# Patient Record
Sex: Female | Born: 1935 | Race: White | Hispanic: No | Marital: Single | Smoking: Former smoker
Health system: Southern US, Community
[De-identification: ages and names within clinical notes are randomized; demographics above are authoritative.]

## PROBLEM LIST (undated history)

## (undated) DIAGNOSIS — I2510 Atherosclerotic heart disease of native coronary artery without angina pectoris: Secondary | ICD-10-CM

## (undated) DIAGNOSIS — I10 Essential (primary) hypertension: Secondary | ICD-10-CM

## (undated) DIAGNOSIS — E039 Hypothyroidism, unspecified: Secondary | ICD-10-CM

## (undated) DIAGNOSIS — C801 Malignant (primary) neoplasm, unspecified: Secondary | ICD-10-CM

## (undated) DIAGNOSIS — I509 Heart failure, unspecified: Secondary | ICD-10-CM

## (undated) DIAGNOSIS — Z950 Presence of cardiac pacemaker: Secondary | ICD-10-CM

## (undated) DIAGNOSIS — I441 Atrioventricular block, second degree: Secondary | ICD-10-CM

## (undated) DIAGNOSIS — F411 Generalized anxiety disorder: Secondary | ICD-10-CM

## (undated) DIAGNOSIS — E785 Hyperlipidemia, unspecified: Secondary | ICD-10-CM

## (undated) DIAGNOSIS — N189 Chronic kidney disease, unspecified: Secondary | ICD-10-CM

## (undated) DIAGNOSIS — E119 Type 2 diabetes mellitus without complications: Secondary | ICD-10-CM

## (undated) DIAGNOSIS — Z923 Personal history of irradiation: Secondary | ICD-10-CM

## (undated) DIAGNOSIS — I219 Acute myocardial infarction, unspecified: Secondary | ICD-10-CM

## (undated) DIAGNOSIS — M8580 Other specified disorders of bone density and structure, unspecified site: Secondary | ICD-10-CM

## (undated) DIAGNOSIS — C4490 Unspecified malignant neoplasm of skin, unspecified: Secondary | ICD-10-CM

## (undated) DIAGNOSIS — F39 Unspecified mood [affective] disorder: Secondary | ICD-10-CM

## (undated) DIAGNOSIS — Z794 Long term (current) use of insulin: Secondary | ICD-10-CM

## (undated) DIAGNOSIS — IMO0001 Reserved for inherently not codable concepts without codable children: Secondary | ICD-10-CM

## (undated) DIAGNOSIS — I639 Cerebral infarction, unspecified: Secondary | ICD-10-CM

## (undated) DIAGNOSIS — R55 Syncope and collapse: Secondary | ICD-10-CM

## (undated) DIAGNOSIS — C44399 Other specified malignant neoplasm of skin of other parts of face: Secondary | ICD-10-CM

## (undated) DIAGNOSIS — R0989 Other specified symptoms and signs involving the circulatory and respiratory systems: Secondary | ICD-10-CM

## (undated) DIAGNOSIS — M1710 Unilateral primary osteoarthritis, unspecified knee: Secondary | ICD-10-CM

## (undated) DIAGNOSIS — J449 Chronic obstructive pulmonary disease, unspecified: Secondary | ICD-10-CM

## (undated) HISTORY — DX: Atrioventricular block, second degree: I44.1

## (undated) HISTORY — DX: Other specified symptoms and signs involving the circulatory and respiratory systems: R09.89

## (undated) HISTORY — DX: Personal history of irradiation: Z92.3

## (undated) HISTORY — PX: TUMOR REMOVAL: SHX12

---

## 1998-06-15 ENCOUNTER — Ambulatory Visit: Admission: RE | Admit: 1998-06-15 | Discharge: 1998-06-15 | Payer: Self-pay | Admitting: Internal Medicine

## 1999-11-09 ENCOUNTER — Encounter: Admission: RE | Admit: 1999-11-09 | Discharge: 2000-02-07 | Payer: Self-pay | Admitting: Orthopedic Surgery

## 2000-02-15 ENCOUNTER — Encounter: Admission: RE | Admit: 2000-02-15 | Discharge: 2000-04-24 | Payer: Self-pay | Admitting: Orthopedic Surgery

## 2000-03-01 ENCOUNTER — Other Ambulatory Visit: Admission: RE | Admit: 2000-03-01 | Discharge: 2000-03-01 | Payer: Self-pay | Admitting: Urology

## 2000-08-01 ENCOUNTER — Encounter: Admission: RE | Admit: 2000-08-01 | Discharge: 2000-08-03 | Payer: Self-pay | Admitting: Orthopedic Surgery

## 2000-12-05 ENCOUNTER — Encounter: Admission: RE | Admit: 2000-12-05 | Discharge: 2000-12-11 | Payer: Self-pay | Admitting: Orthopedic Surgery

## 2001-01-25 ENCOUNTER — Encounter: Payer: Self-pay | Admitting: Internal Medicine

## 2001-01-25 ENCOUNTER — Ambulatory Visit (HOSPITAL_COMMUNITY): Admission: RE | Admit: 2001-01-25 | Discharge: 2001-01-25 | Payer: Self-pay | Admitting: Internal Medicine

## 2001-02-28 ENCOUNTER — Encounter: Payer: Self-pay | Admitting: *Deleted

## 2001-02-28 ENCOUNTER — Inpatient Hospital Stay (HOSPITAL_COMMUNITY): Admission: EM | Admit: 2001-02-28 | Discharge: 2001-03-05 | Payer: Self-pay | Admitting: *Deleted

## 2001-03-01 ENCOUNTER — Encounter: Payer: Self-pay | Admitting: Cardiology

## 2001-03-02 HISTORY — PX: CORONARY ANGIOPLASTY WITH STENT PLACEMENT: SHX49

## 2001-03-13 ENCOUNTER — Encounter: Admission: RE | Admit: 2001-03-13 | Discharge: 2001-03-19 | Payer: Self-pay | Admitting: Orthopedic Surgery

## 2001-07-01 ENCOUNTER — Encounter: Admission: RE | Admit: 2001-07-01 | Discharge: 2001-07-06 | Payer: Self-pay | Admitting: Orthopedic Surgery

## 2001-07-10 ENCOUNTER — Encounter (HOSPITAL_COMMUNITY): Admission: RE | Admit: 2001-07-10 | Discharge: 2001-10-08 | Payer: Self-pay | Admitting: Cardiology

## 2001-08-07 ENCOUNTER — Encounter: Payer: Self-pay | Admitting: Emergency Medicine

## 2001-08-07 ENCOUNTER — Emergency Department (HOSPITAL_COMMUNITY): Admission: EM | Admit: 2001-08-07 | Discharge: 2001-08-07 | Payer: Self-pay | Admitting: Emergency Medicine

## 2001-09-03 ENCOUNTER — Encounter (HOSPITAL_BASED_OUTPATIENT_CLINIC_OR_DEPARTMENT_OTHER): Admission: RE | Admit: 2001-09-03 | Discharge: 2001-11-23 | Payer: Self-pay | Admitting: Internal Medicine

## 2001-11-27 ENCOUNTER — Inpatient Hospital Stay (HOSPITAL_COMMUNITY): Admission: AD | Admit: 2001-11-27 | Discharge: 2001-12-02 | Payer: Self-pay | Admitting: Cardiovascular Disease

## 2001-11-28 HISTORY — PX: CARDIAC CATHETERIZATION: SHX172

## 2001-11-29 HISTORY — PX: CORONARY ANGIOPLASTY WITH STENT PLACEMENT: SHX49

## 2002-01-07 ENCOUNTER — Encounter (HOSPITAL_BASED_OUTPATIENT_CLINIC_OR_DEPARTMENT_OTHER): Admission: RE | Admit: 2002-01-07 | Discharge: 2002-04-07 | Payer: Self-pay | Admitting: Internal Medicine

## 2002-04-16 ENCOUNTER — Encounter (HOSPITAL_BASED_OUTPATIENT_CLINIC_OR_DEPARTMENT_OTHER): Admission: RE | Admit: 2002-04-16 | Discharge: 2002-07-15 | Payer: Self-pay | Admitting: Internal Medicine

## 2002-07-16 ENCOUNTER — Encounter (HOSPITAL_BASED_OUTPATIENT_CLINIC_OR_DEPARTMENT_OTHER): Admission: RE | Admit: 2002-07-16 | Discharge: 2002-10-14 | Payer: Self-pay | Admitting: Internal Medicine

## 2002-10-15 ENCOUNTER — Inpatient Hospital Stay (HOSPITAL_COMMUNITY): Admission: EM | Admit: 2002-10-15 | Discharge: 2002-10-18 | Payer: Self-pay | Admitting: Emergency Medicine

## 2002-10-15 ENCOUNTER — Encounter: Payer: Self-pay | Admitting: Cardiology

## 2002-10-16 ENCOUNTER — Encounter: Payer: Self-pay | Admitting: Emergency Medicine

## 2002-10-16 HISTORY — PX: CORONARY ANGIOPLASTY WITH STENT PLACEMENT: SHX49

## 2002-10-21 ENCOUNTER — Encounter (HOSPITAL_BASED_OUTPATIENT_CLINIC_OR_DEPARTMENT_OTHER): Admission: RE | Admit: 2002-10-21 | Discharge: 2002-10-29 | Payer: Self-pay | Admitting: Internal Medicine

## 2002-10-25 DIAGNOSIS — R0989 Other specified symptoms and signs involving the circulatory and respiratory systems: Secondary | ICD-10-CM

## 2002-10-25 HISTORY — DX: Other specified symptoms and signs involving the circulatory and respiratory systems: R09.89

## 2003-01-24 ENCOUNTER — Encounter (HOSPITAL_BASED_OUTPATIENT_CLINIC_OR_DEPARTMENT_OTHER): Admission: RE | Admit: 2003-01-24 | Discharge: 2003-02-04 | Payer: Self-pay | Admitting: Internal Medicine

## 2003-02-07 ENCOUNTER — Emergency Department (HOSPITAL_COMMUNITY): Admission: EM | Admit: 2003-02-07 | Discharge: 2003-02-07 | Payer: Self-pay

## 2003-05-01 ENCOUNTER — Encounter (HOSPITAL_BASED_OUTPATIENT_CLINIC_OR_DEPARTMENT_OTHER): Admission: RE | Admit: 2003-05-01 | Discharge: 2003-05-16 | Payer: Self-pay | Admitting: Internal Medicine

## 2003-08-06 ENCOUNTER — Encounter (HOSPITAL_BASED_OUTPATIENT_CLINIC_OR_DEPARTMENT_OTHER): Admission: RE | Admit: 2003-08-06 | Discharge: 2003-08-22 | Payer: Self-pay | Admitting: Internal Medicine

## 2003-11-19 ENCOUNTER — Encounter (HOSPITAL_BASED_OUTPATIENT_CLINIC_OR_DEPARTMENT_OTHER): Admission: RE | Admit: 2003-11-19 | Discharge: 2003-12-05 | Payer: Self-pay | Admitting: Internal Medicine

## 2004-03-02 ENCOUNTER — Encounter (HOSPITAL_BASED_OUTPATIENT_CLINIC_OR_DEPARTMENT_OTHER): Admission: RE | Admit: 2004-03-02 | Discharge: 2004-03-23 | Payer: Self-pay | Admitting: Internal Medicine

## 2004-06-09 ENCOUNTER — Encounter (HOSPITAL_BASED_OUTPATIENT_CLINIC_OR_DEPARTMENT_OTHER): Admission: RE | Admit: 2004-06-09 | Discharge: 2004-06-18 | Payer: Self-pay | Admitting: Internal Medicine

## 2005-06-03 ENCOUNTER — Inpatient Hospital Stay (HOSPITAL_COMMUNITY): Admission: EM | Admit: 2005-06-03 | Discharge: 2005-06-05 | Payer: Self-pay | Admitting: Emergency Medicine

## 2005-12-19 ENCOUNTER — Encounter: Admission: RE | Admit: 2005-12-19 | Discharge: 2005-12-19 | Payer: Self-pay | Admitting: Internal Medicine

## 2006-01-12 ENCOUNTER — Emergency Department (HOSPITAL_COMMUNITY): Admission: EM | Admit: 2006-01-12 | Discharge: 2006-01-12 | Payer: Self-pay | Admitting: Emergency Medicine

## 2006-04-21 ENCOUNTER — Inpatient Hospital Stay (HOSPITAL_COMMUNITY): Admission: EM | Admit: 2006-04-21 | Discharge: 2006-04-27 | Payer: Self-pay | Admitting: Emergency Medicine

## 2006-06-19 ENCOUNTER — Encounter: Admission: RE | Admit: 2006-06-19 | Discharge: 2006-06-19 | Payer: Self-pay | Admitting: Neurosurgery

## 2006-09-15 ENCOUNTER — Emergency Department (HOSPITAL_COMMUNITY): Admission: EM | Admit: 2006-09-15 | Discharge: 2006-09-15 | Payer: Self-pay | Admitting: Emergency Medicine

## 2007-02-06 ENCOUNTER — Encounter: Admission: RE | Admit: 2007-02-06 | Discharge: 2007-02-06 | Payer: Self-pay | Admitting: General Surgery

## 2008-01-09 ENCOUNTER — Encounter: Admission: RE | Admit: 2008-01-09 | Discharge: 2008-01-09 | Payer: Self-pay | Admitting: Otolaryngology

## 2008-02-01 ENCOUNTER — Encounter: Admission: RE | Admit: 2008-02-01 | Discharge: 2008-02-01 | Payer: Self-pay | Admitting: Otolaryngology

## 2008-02-05 ENCOUNTER — Ambulatory Visit (HOSPITAL_BASED_OUTPATIENT_CLINIC_OR_DEPARTMENT_OTHER): Admission: RE | Admit: 2008-02-05 | Discharge: 2008-02-06 | Payer: Self-pay | Admitting: Otolaryngology

## 2008-02-05 ENCOUNTER — Encounter (INDEPENDENT_AMBULATORY_CARE_PROVIDER_SITE_OTHER): Payer: Self-pay | Admitting: Otolaryngology

## 2009-06-02 ENCOUNTER — Inpatient Hospital Stay (HOSPITAL_COMMUNITY): Admission: EM | Admit: 2009-06-02 | Discharge: 2009-06-05 | Payer: Self-pay | Admitting: Emergency Medicine

## 2009-06-03 ENCOUNTER — Encounter (INDEPENDENT_AMBULATORY_CARE_PROVIDER_SITE_OTHER): Payer: Self-pay | Admitting: Internal Medicine

## 2009-06-03 HISTORY — PX: CARDIAC CATHETERIZATION: SHX172

## 2009-06-04 HISTORY — PX: CORONARY ANGIOPLASTY WITH STENT PLACEMENT: SHX49

## 2009-10-08 ENCOUNTER — Encounter: Admission: RE | Admit: 2009-10-08 | Discharge: 2009-10-08 | Payer: Self-pay | Admitting: Internal Medicine

## 2010-04-25 ENCOUNTER — Encounter: Payer: Self-pay | Admitting: Internal Medicine

## 2010-05-27 ENCOUNTER — Inpatient Hospital Stay: Admission: AD | Admit: 2010-05-27 | Payer: Self-pay | Source: Ambulatory Visit | Admitting: Cardiovascular Disease

## 2010-05-27 ENCOUNTER — Inpatient Hospital Stay (HOSPITAL_COMMUNITY)
Admission: EM | Admit: 2010-05-27 | Discharge: 2010-05-29 | DRG: 287 | Disposition: A | Discharge status: Home / Self Care | Payer: Medicare Other | Attending: Cardiovascular Disease | Admitting: Cardiovascular Disease

## 2010-05-27 DIAGNOSIS — E785 Hyperlipidemia, unspecified: Secondary | ICD-10-CM | POA: Diagnosis present

## 2010-05-27 DIAGNOSIS — I2510 Atherosclerotic heart disease of native coronary artery without angina pectoris: Secondary | ICD-10-CM | POA: Diagnosis present

## 2010-05-27 DIAGNOSIS — Z9861 Coronary angioplasty status: Secondary | ICD-10-CM

## 2010-05-27 DIAGNOSIS — I10 Essential (primary) hypertension: Secondary | ICD-10-CM | POA: Diagnosis present

## 2010-05-27 DIAGNOSIS — E119 Type 2 diabetes mellitus without complications: Secondary | ICD-10-CM | POA: Diagnosis present

## 2010-05-27 DIAGNOSIS — R079 Chest pain, unspecified: Principal | ICD-10-CM | POA: Diagnosis present

## 2010-05-27 DIAGNOSIS — E669 Obesity, unspecified: Secondary | ICD-10-CM | POA: Diagnosis present

## 2010-05-27 LAB — DIFFERENTIAL
Basophils Relative: 1 % (ref 0–1)
Eosinophils Absolute: 0.3 10*3/uL (ref 0.0–0.7)
Eosinophils Relative: 3 % (ref 0–5)
Monocytes Absolute: 1.3 10*3/uL — ABNORMAL HIGH (ref 0.1–1.0)
Monocytes Relative: 13 % — ABNORMAL HIGH (ref 3–12)

## 2010-05-27 LAB — CBC
Hemoglobin: 14.5 g/dL (ref 12.0–15.0)
MCH: 29.5 pg (ref 26.0–34.0)
MCHC: 33.4 g/dL (ref 30.0–36.0)
Platelets: 236 10*3/uL (ref 150–400)
RDW: 15.1 % (ref 11.5–15.5)

## 2010-05-27 LAB — CK TOTAL AND CKMB (NOT AT ARMC): Relative Index: INVALID (ref 0.0–2.5)

## 2010-05-27 LAB — COMPREHENSIVE METABOLIC PANEL
ALT: 14 U/L (ref 0–35)
BUN: 17 mg/dL (ref 6–23)
CO2: 24 mEq/L (ref 19–32)
Calcium: 9.4 mg/dL (ref 8.4–10.5)
GFR calc non Af Amer: 38 mL/min — ABNORMAL LOW (ref >60)
Glucose, Bld: 107 mg/dL — ABNORMAL HIGH (ref 70–99)
Sodium: 144 mEq/L (ref 135–145)

## 2010-05-27 LAB — MAGNESIUM: Magnesium: 2 mg/dL (ref 1.5–2.5)

## 2010-05-27 LAB — GLUCOSE, CAPILLARY: Glucose-Capillary: 70 mg/dL (ref 70–99)

## 2010-05-27 LAB — APTT: aPTT: 33 seconds (ref 24–37)

## 2010-05-27 LAB — PROTIME-INR: INR: 0.97 (ref 0.00–1.49)

## 2010-05-28 ENCOUNTER — Observation Stay (HOSPITAL_COMMUNITY): Payer: Medicare Other

## 2010-05-28 HISTORY — PX: CARDIAC CATHETERIZATION: SHX172

## 2010-05-28 LAB — CARDIAC PANEL(CRET KIN+CKTOT+MB+TROPI)
CK, MB: 1.1 ng/mL (ref 0.3–4.0)
Troponin I: 0.01 ng/mL (ref 0.00–0.06)

## 2010-05-28 LAB — LIPID PANEL
Cholesterol: 115 mg/dL (ref 0–200)
HDL: 42 mg/dL (ref >39)
LDL Cholesterol: 53 mg/dL (ref 0–99)
Total CHOL/HDL Ratio: 2.7 RATIO
VLDL: 20 mg/dL (ref 0–40)

## 2010-05-28 LAB — CBC
HCT: 39.3 % (ref 36.0–46.0)
Hemoglobin: 13.1 g/dL (ref 12.0–15.0)
MCHC: 33.3 g/dL (ref 30.0–36.0)
MCV: 87.9 fL (ref 78.0–100.0)
RDW: 15.1 % (ref 11.5–15.5)

## 2010-05-28 LAB — GLUCOSE, CAPILLARY
Glucose-Capillary: 140 mg/dL — ABNORMAL HIGH (ref 70–99)
Glucose-Capillary: 241 mg/dL — ABNORMAL HIGH (ref 70–99)
Glucose-Capillary: 256 mg/dL — ABNORMAL HIGH (ref 70–99)

## 2010-05-28 LAB — HEPARIN LEVEL (UNFRACTIONATED): Heparin Unfractionated: <0.1 IU/mL — ABNORMAL LOW (ref 0.30–0.70)

## 2010-05-29 LAB — CBC
Hemoglobin: 13 g/dL (ref 12.0–15.0)
Platelets: 208 10*3/uL (ref 150–400)
RBC: 4.34 MIL/uL (ref 3.87–5.11)
WBC: 9.9 10*3/uL (ref 4.0–10.5)

## 2010-05-29 LAB — BASIC METABOLIC PANEL
CO2: 24 mEq/L (ref 19–32)
Chloride: 109 mEq/L (ref 96–112)
GFR calc Af Amer: 44 mL/min — ABNORMAL LOW (ref >60)
Potassium: 3.8 mEq/L (ref 3.5–5.1)

## 2010-05-29 LAB — GLUCOSE, CAPILLARY: Glucose-Capillary: 159 mg/dL — ABNORMAL HIGH (ref 70–99)

## 2010-06-01 LAB — POCT ACTIVATED CLOTTING TIME: Activated Clotting Time: 87 seconds

## 2010-06-07 NOTE — Procedures (Signed)
[name], [name]           ACCOUNT NO.:  [id number]  MEDICAL RECORD NO.:  [id number]           PATIENT TYPE:  I  LOCATION:  6523                         FACILITY:  [facility]  PHYSICIAN:  Nicki Guadalajara, M.D.     DATE OF BIRTH:  [birth year]  DATE OF PROCEDURE:  05/28/2010 DATE OF DISCHARGE:                           CARDIAC CATHETERIZATION   INDICATIONS:  [name] is a 77 year old female who has documented coronary artery disease, obesity, type 2 diabetes mellitus, hypertension, and dyslipidemia.  She apparently underwent stenting of her right coronary artery initially in 2004.  In March 2001, she was found to have mild left coronary artery disease and was found to have in- stent narrowing in the mid RCA stent.  She apparently underwent re- stenting at this site and also stenting very proximally.  She did have residual disease between her stents of approximately 40% proximally and at least 50-60% distally.  She has been treated medically.  Apparently, she was admitted from the office yesterday by Dr. Allyson Sabal after she had experienced an episode of chest pain, worrisome for unstable angina the day previously.  Cardiac enzymes have been negative initially.  She was referred for definitive diagnostic cardiac catheterization.  PROCEDURE:  After premedication with Versed 1 mg plus fentanyl 25 mcg, the patient was prepped and draped in the usual fashion.  The right femoral artery was punctured anteriorly and a 5-French sheath was inserted without difficulty.  Diagnostic cardiac catheterization was done utilizing 5-French Judkins 4 left and right coronary catheters.  A 5-French pigtail catheter was used for left ventriculography.  With the patient's hypertensive history and coronary artery disease, distal aortography was also performed.  Prior to breaking scrub, the current angiograms were reviewed with the prior study and after the demonstration of no significant change the  decision was made to end the study and treat medically.  She left the Catheterization Laboratory in stable condition.  Hemostasis was obtained by direct manual pressure.  HEMODYNAMIC DATA:  Central aortic pressure was 158/63, left ventricular pressure 158/13, post A-wave 16.  ANGIOGRAPHIC DATA:  There is moderate calcification of the left coronary system.  She has previously demonstrated smooth ostial narrowing of her left main with narrowing of approximately 40% seen best on the LAO cranial images.  The left main bifurcated into the LAD and left circumflex system.  The LAD was a large vessel that gave rise to a proximal diagonal vessel and several septal perforating arteries.  The midportion seemed to dip intramyocardially.  The distal LAD wrapped around the apex.  The LAD and its branches were free of significant obstructive disease.  The circumflex vessel gave rise to 2 major marginal vessels and was free of significant obstructive disease.  There actually seemed improvement from the last study.  The right coronary artery was a moderate-sized vessel.  There was smooth ostial 30% narrowing, not changed.  This was followed by proximal stent which was widely patent.  There was then a gap with narrowing eccentrically of approximately 40% before the insertion of the next series of stents.  This 40% area of eccentric narrowing was not changed and was present  1 year ago.  There was no restenosis in the mid stents which had been a site of in-stent restenosis last year.  The distal aspect of the stent extended to the acute margin.  There was smooth 50% narrowing beyond the stented segment prior to the PDA which was unchanged from last year and if anything appeared somewhat improved and did not progress.  The RCA entered in a small PDA system and continuation branch.  RAO ventriculography revealed normal global contractility with an ejection fraction of at least 60%.  There was minimal  hypocontractility in the basal inferior wall, but this area did contract well.  Distal aortography revealed patent renal arteries.  She did have diffuse mild aneurysmal dilatation of her infrarenal aorta beyond the renal arteries and extending proximal to the iliac bifurcation.  IMPRESSION: 1. Preserved global left ventricular contractility with an ejection     fraction of at least 65% and with mild relative residual     hypocontractility in the basal inferior wall. 2. Evidence for significant coronary calcification with smooth 40%     ostial left main stenosis, calcification of the proximal left     anterior descending without significant obstructive disease, and     relatively normal left circumflex system. 3. No significant restenosis in the right coronary artery with     previously noted smooth 30% ostial proximal narrowing prior to the     initially placed stent with a widely patent proximal stent, no     change in the 40% narrowing eccentrically in the segment between     the proximal and mid stented region with widely patent stents in     the mid right coronary artery without restenosis from the last     intervention and no change in the previously noted smooth 50%     distal right coronary artery stenosis beyond the distal stented     segment. 4. No evidence for renal artery stenosis, but evidence for mild to     moderate aneurysmal dilatation of the infrarenal aorta.  RECOMMENDATIONS:  Medical therapy.          ______________________________ Nicki Guadalajara, M.D.     TK/MEDQ  D:  05/28/2010  T:  05/29/2010  Job:  [id number]  Electronically Signed by Nicki Guadalajara M.D. on 06/07/2010 05:17:40 PM

## 2010-06-16 NOTE — Consult Note (Signed)
  [name]           ACCOUNT NO.:  [id number]  MEDICAL RECORD NO.:  [id number]           PATIENT TYPE:  I  LOCATION:  6523                         FACILITY:  [facility]  PHYSICIAN:  Nanetta Batty, M.D.   DATE OF BIRTH:  09-May-1928  DATE OF CONSULTATION:  05/27/2010 DATE OF DISCHARGE:                                CONSULTATION   [name] is a 77 year old moderately overweight single Caucasian female mother with no children, who I last saw approximately 6 months ago.  She lives alone and was formally a patient of Dr. Truett Perna.  She has a history of CAD, status post RCA stenting by Dr. Jacinto Halim October 16, 2002, with normal LV function at that time.  Other problems include discontinued tobacco abuse having smoked one-half pack per day for 40 years, diabetes, hypertension, and dyslipidemia.  She was admitted with chest pain back in March 2011 and underwent cath by Dr. Royann Shivers revealing in-stent restenosis.  I restented her Promus drug-eluting stents.  She had diffuse plaquing of her LAD.  Yesterday, she has had crescendo angina going into her arm, jaw, chest associated with shortness of breath.  MEDICATIONS: 1. Aspirin 81. 2. Plavix 75. 3. Toprol-XL 100. 4. Lasix 20 mg every other day. 5. Vytorin 10/20. 6. Synthroid 25 mcg. 7. Insulin as directed. 8. Amlodipine 5.  ALLERGIES:  None.  Twelve-point review of systems was negative except as already noted.  PHYSICAL EXAMINATION:  VITAL SIGNS:  Blood pressure is 156/80, pulse 67. Weight is stable at 217. GENERAL:  The patient is alert and oriented, neurologically intact. LUNGS:  Clear. NECK:  Carotids are 2+, without bruits. HEART:  Regular rate and rhythm, without murmurs, gallops, rubs, or clicks.  Twelve-lead EKG reveals sinus rhythm at 67.  No ST or T wave changes.  IMPRESSION:  [name] has accelerated angina with known coronary artery disease, status post right coronary artery intervention 1 year ago.  I  am going to admit her to Step-Down, heparinize her, and have her undergo diagnostic coronary arteriography to define her anatomy.  The patient is agreeable to this approach.     Nanetta Batty, M.D.     Cordelia Pen  D:  05/27/2010  T:  05/28/2010  Job:  [id number]  cc:   Hal T. Stoneking, M.D.  Electronically Signed by Nanetta Batty M.D. on 06/16/2010 01:57:14 PM

## 2010-06-16 NOTE — Discharge Summary (Signed)
[name], [name]           ACCOUNT NO.:  [id number]  MEDICAL RECORD NO.:  [id number]           PATIENT TYPE:  I  LOCATION:  6523                         FACILITY:  [facility]  PHYSICIAN:  Nanetta Batty, M.D.   DATE OF BIRTH:  29-May-1928  DATE OF ADMISSION:  05/27/2010 DATE OF DISCHARGE:  05/29/2010                              DISCHARGE SUMMARY   DISCHARGE DIAGNOSES: 1. Accelerated angina. 2. Coronary artery disease status post stent to the right coronary     artery, October 16, 2002, normal left ventricular function. 3. Diabetes mellitus. 4. Hypertension. 5. History of 40 years tobacco use. 6. Dyslipidemia. 7. Ejection fraction 60%.  HOSPITAL COURSE:  [name] is a 77 year old moderately overweight Caucasian female.  She lives with a roommate.  She has a history of coronary artery disease status post RCA stent by Dr. Jacinto Halim in 2004 with normal LV function at that time.  Other problems include continued tobacco abuse, having smoked one half pack for the last 40 years, diabetes, hypertension, dyslipidemia.  She previously underwent cardiac catheterization in March 2011 by Dr. Royann Shivers revealing in-stent restenosis, this was restented with a Promus drug-eluting stent.  She also has diffuse plaquing in her LAD.  She presented with crescendo angina going to her arm and jaw, chest associated with shortness of breath.  She was admitted.  Cardiac enzymes were cycled and she was scheduled for cardiac catheterization on February 24.  Cardiac enzymes were negative x2.  Tobacco cessation was requested.  The patient quit in 2011.  She is having problems with her roommate smoking in the room all times.  As of the morning of the February 24, she had no complaints of chest pain, just little bit of shortness of breath.  Chest x-ray revealed no acute findings.  Cardiac cath on February 24 revealed ejection fraction of at least 65% and 40% ostial left main stenosis, calcification of the  proximal left anterior descending without significant obstructive disease, and relatively on the left circumflex system.  There was no significant restenosis in the right coronary with previously noted smooth 30% ostial proximal narrowing prior to the initially placed stent with widely patent proximal stent change with no change in the 4% narrowing eccentrically in the segment between the proximal mid stented region with widely patent stents in the mid right coronary artery without restenosis.  On the last intervention, no change in the previously noted smooth 40%  distal right coronary artery stenosis beyond the distal stented segment.  No evidence of renal artery stenosis.  The patient is currently pain free, February 25.  She was seen by Dr. Allyson Sabal who feels she is stable for discharge home and follow up in 1-2 weeks with him.  DISCHARGE LABS:  WBC is 9.9, hemoglobin 13.0, hematocrit 38.4, platelets 208.  Sodium 145, potassium 3.8, chloride 109, carbon dioxide 24, BUN 16, creatinine 1.40, calcium 8.6.  A1c 8.6.  Negative cardiac enzymes. Total cholesterol 115, triglycerides 98, HDL 42, LDL 53.  STUDIES/PROCEDURES: 1. Chest x-ray on February 24, no acute findings.  Heart size stable.     Thoracic aorta is calcified.  Added density above the aortic  arch     due to mediastinal lipomatosis.  Lungs were clear.  No pleural     fluid.  Degenerative changes in the spine. 2. Cardiac catheterization, February 24, impression, preserved global     left ventricular contractility and ejection fraction 65% with mild     relative residual hypocontractility in the basal inferior wall.  A     70% significant coronary artery calcification with smooth 40%     ostial left main stenosis, calcification of proximal left anterior     descending without significant obstructive disease and relative     common left circumflex system.  No significant restenosis in the     right coronary arteries with previously  noted smooth 30% ostial     proximal narrowing prior to the initially placed stent with a     widely patent proximal stent.  No change in the 4% narrowing     essentially in the segment between the proximal mid stent region     with mildly patent stent in the mid right coronary artery without     restenosis of at least in the last intervention.  No change in     previously noted smooth 40% distal right coronary artery stenosis     beyond the distal stent segment.  No evidence of renal artery     stenosis, but evidence for mild-to-moderate aneurysmal dilation of     the inferior aorta.  DISCHARGE MEDICATIONS: 1. Imdur 30 mg 1 tablet by mouth daily. 2. Amlodipine 5 mg 1 tablet by mouth daily. 3. Aspirin enteric-coated 81 mg 1 tablet by mouth daily. 4. Insulin NPH 24 units subcutaneously twice daily. 5. Lasix 40 mg 1 tablet by mouth daily. 6. Gabapentin 300 mg 1 capsule by mouth twice daily. 7. Nitroglycerin sublingual 0.4 mg 1 tablet under the tongue every 5     minutes up to 3 doses as needed for chest pain. 8. Plavix 75 mg 1 tablet by mouth daily. 9. Synthroid 25 mcg 1 tablet by mouth daily. 10.Toprol-XL 100 mg 1 tablet by mouth daily. 11.Vytorin 10/20 mg 1 tablet by mouth daily.  DISPOSITION:  [name] will be discharged home in stable condition. She is to increase her activity slowly and shower and bathe.  No lifting or driving for 2 days.  She is to eat a heart-healthy diet.  If the catheter site becomes red, painful, swollen, or discharges any fluid, she is to call our office.  She will follow up with Dr. Allyson Sabal in approximately 1-2 weeks in our office. We will call her with the appointment time.    ______________________________ Wilburt Finlay, PA   ______________________________ Nanetta Batty, M.D.    BH/MEDQ  D:  05/29/2010  T:  05/29/2010  Job:  [id number]  cc:   Nanetta Batty, M.D.  Electronically Signed by Wilburt Finlay PA on 06/02/2010 04:16:21  PM Electronically Signed by Nanetta Batty M.D. on 06/16/2010 01:57:11 PM

## 2010-06-25 LAB — COMPREHENSIVE METABOLIC PANEL
ALT: 12 U/L (ref 0–35)
ALT: 18 U/L (ref 0–35)
AST: 20 U/L (ref 0–37)
Albumin: 2.8 g/dL — ABNORMAL LOW (ref 3.5–5.2)
BUN: 22 mg/dL (ref 6–23)
CO2: 26 mEq/L (ref 19–32)
Calcium: 8.9 mg/dL (ref 8.4–10.5)
Chloride: 108 mEq/L (ref 96–112)
GFR calc Af Amer: 45 mL/min — ABNORMAL LOW (ref >60)
GFR calc non Af Amer: 37 mL/min — ABNORMAL LOW (ref >60)
Glucose, Bld: 147 mg/dL — ABNORMAL HIGH (ref 70–99)
Potassium: 3.9 mEq/L (ref 3.5–5.1)
Sodium: 139 mEq/L (ref 135–145)
Sodium: 141 mEq/L (ref 135–145)
Total Bilirubin: 1 mg/dL (ref 0.3–1.2)
Total Protein: 6.2 g/dL (ref 6.0–8.3)

## 2010-06-25 LAB — DIFFERENTIAL
Eosinophils Relative: 2 % (ref 0–5)
Lymphocytes Relative: 29 % (ref 12–46)
Lymphs Abs: 2.8 10*3/uL (ref 0.7–4.0)
Lymphs Abs: 3 10*3/uL (ref 0.7–4.0)
Monocytes Absolute: 1.1 10*3/uL — ABNORMAL HIGH (ref 0.1–1.0)
Monocytes Relative: 10 % (ref 3–12)
Monocytes Relative: 12 % (ref 3–12)
Neutro Abs: 4.5 10*3/uL (ref 1.7–7.7)
Neutrophils Relative %: 49 % (ref 43–77)

## 2010-06-25 LAB — URINALYSIS, ROUTINE W REFLEX MICROSCOPIC
Ketones, ur: NEGATIVE mg/dL
Leukocytes, UA: NEGATIVE
Nitrite: NEGATIVE
Protein, ur: 100 mg/dL — AB
pH: 6.5 (ref 5.0–8.0)

## 2010-06-25 LAB — CARDIAC PANEL(CRET KIN+CKTOT+MB+TROPI)
CK, MB: 0.9 ng/mL (ref 0.3–4.0)
CK, MB: 1 ng/mL (ref 0.3–4.0)
Relative Index: INVALID (ref 0.0–2.5)
Total CK: 49 U/L (ref 7–177)
Troponin I: 0.01 ng/mL (ref 0.00–0.06)
Troponin I: 0.03 ng/mL (ref 0.00–0.06)

## 2010-06-25 LAB — BASIC METABOLIC PANEL
BUN: 16 mg/dL (ref 6–23)
BUN: 25 mg/dL — ABNORMAL HIGH (ref 6–23)
CO2: 20 mEq/L (ref 19–32)
Chloride: 109 mEq/L (ref 96–112)
Creatinine, Ser: 1.23 mg/dL — ABNORMAL HIGH (ref 0.4–1.2)
GFR calc Af Amer: 46 mL/min — ABNORMAL LOW (ref >60)
GFR calc non Af Amer: 38 mL/min — ABNORMAL LOW (ref >60)
Glucose, Bld: 258 mg/dL — ABNORMAL HIGH (ref 70–99)
Potassium: 4.6 mEq/L (ref 3.5–5.1)
Sodium: 142 mEq/L (ref 135–145)

## 2010-06-25 LAB — LIPID PANEL
Cholesterol: 120 mg/dL (ref 0–200)
HDL: 49 mg/dL (ref >39)
Triglycerides: 89 mg/dL (ref <150)

## 2010-06-25 LAB — GLUCOSE, CAPILLARY
Glucose-Capillary: 106 mg/dL — ABNORMAL HIGH (ref 70–99)
Glucose-Capillary: 108 mg/dL — ABNORMAL HIGH (ref 70–99)
Glucose-Capillary: 155 mg/dL — ABNORMAL HIGH (ref 70–99)
Glucose-Capillary: 157 mg/dL — ABNORMAL HIGH (ref 70–99)
Glucose-Capillary: 187 mg/dL — ABNORMAL HIGH (ref 70–99)
Glucose-Capillary: 63 mg/dL — ABNORMAL LOW (ref 70–99)
Glucose-Capillary: 97 mg/dL (ref 70–99)

## 2010-06-25 LAB — URINALYSIS, MICROSCOPIC ONLY
Bilirubin Urine: NEGATIVE
Nitrite: NEGATIVE
Specific Gravity, Urine: 1.024 (ref 1.005–1.030)
pH: 5 (ref 5.0–8.0)

## 2010-06-25 LAB — CBC
HCT: 45.1 % (ref 36.0–46.0)
Hemoglobin: 13.4 g/dL (ref 12.0–15.0)
Hemoglobin: 15.3 g/dL — ABNORMAL HIGH (ref 12.0–15.0)
MCHC: 33.9 g/dL (ref 30.0–36.0)
MCHC: 34.3 g/dL (ref 30.0–36.0)
MCV: 91.8 fL (ref 78.0–100.0)
Platelets: 178 10*3/uL (ref 150–400)
Platelets: 185 10*3/uL (ref 150–400)
Platelets: 210 10*3/uL (ref 150–400)
RBC: 4.27 MIL/uL (ref 3.87–5.11)
RBC: 4.3 MIL/uL (ref 3.87–5.11)
RBC: 4.91 MIL/uL (ref 3.87–5.11)
RDW: 14.9 % (ref 11.5–15.5)
RDW: 14.9 % (ref 11.5–15.5)
WBC: 10.9 10*3/uL — ABNORMAL HIGH (ref 4.0–10.5)
WBC: 9.6 10*3/uL (ref 4.0–10.5)

## 2010-06-25 LAB — POCT CARDIAC MARKERS: Troponin i, poc: <0.05 ng/mL (ref 0.00–0.09)

## 2010-06-25 LAB — HEMOGLOBIN A1C
Hgb A1c MFr Bld: 9.3 % — ABNORMAL HIGH (ref 4.6–6.1)
Mean Plasma Glucose: 212 mg/dL
Mean Plasma Glucose: 220 mg/dL

## 2010-06-25 LAB — CK TOTAL AND CKMB (NOT AT ARMC)
CK, MB: 1.1 ng/mL (ref 0.3–4.0)
Relative Index: INVALID (ref 0.0–2.5)
Total CK: 56 U/L (ref 7–177)

## 2010-06-25 LAB — URINE CULTURE: Colony Count: NO GROWTH

## 2010-08-17 NOTE — Op Note (Signed)
NAME:  [name], [name]           ACCOUNT NO.:  [id number]   MEDICAL RECORD NO.:  [id number]          PATIENT TYPE:  AMB   LOCATION:  DSC                          FACILITY:  [facility]   PHYSICIAN:  Christopher E. Ezzard Standing, M.D.DATE OF BIRTH:  [birth year]   DATE OF PROCEDURE:  02/05/2008  DATE OF DISCHARGE:                               OPERATIVE REPORT   PREOPERATIVE DIAGNOSIS:  Left parotid mass.   POSTOPERATIVE DIAGNOSIS:  Left parotid tumor.   OPERATIONS:  Left superficial parotidectomy with facial nerve  dissection.   SURGEON:  Kristine Garbe. Ezzard Standing, MD   ASSISTANT:  Hermelinda Medicus, MD   ANESTHESIA:  General endotracheal.   COMPLICATIONS:  None.   BRIEF CLINICAL NOTE:  [name] is a 77 year old female who has  had a slowly enlarging left parotid mass noted for several months.  CT  scan shows a well-demarcated parotid mass in the tail of left parotid  gland.  It measures approximately 2.5 to 3 cm in size.  The mass was  located just behind the angle of the mandible on the left side in the  region of the tail of parotid.  She is taken to operating room at this  time for superficial parotidectomy with facial nerve dissection and  removal of the parotid mass.   DESCRIPTION OF PROCEDURE:  After adequate endotracheal anesthesia, the  patient received 1 gram Ancef IV preoperatively.  The patient was  prepped with Betadine solution and draped out with sterile towels.  A  standard parotid incision was made through the skin.  Anterior skin flap  was elevated off the parotid fascia anteriorly and then posteriorly.  Dissection was carried down through the subcutaneous tissue down to the  sternocleidomastoid muscles attached to the tip of the mastoid.  Dissection was then carried down anterior to the cartilage of the ear  canal to identify the facial nerve.  The styloid process was identified  and palpated.  The nerve was actually identified just lateral and just  superior to  the styloid process that was palpable.  The nerve extended  laterally and around a rather large deep lobe of the parotid gland.  The  nerve was dissected out under direct visualization and just the lower  branch of the facial nerve was cleanly dissected out around the angle of  the mandible.  The parotid mass was posterior and inferior to the lower  branch of the facial nerve.  This was removed and sent to pathology in  saline.  Hemostasis was obtained with 3-0 silk ligatures and bipolar  cautery.  Wound was irrigated with saline.  A JP drain was brought out  prior to closing the incision through a separate stab incision behind  the ear.  The skin incision was then closed with 3-0 chromic sutures  subcutaneously and 5-0 nylon to reapproximate skin edges.  Dressing was  applied as well as bacitracin ointment.  The patient was awoken from  anesthesia and transferred to recovery room, postop doing well.   DISPOSITION:  Krystyna will be observed overnight in recovery care  center and plan on discharge home in the morning after  removing the JP  drain.           ______________________________  Kristine Garbe. Ezzard Standing, M.D.     CEN/MEDQ  D:  02/05/2008  T:  02/05/2008  Job:  [id number]   cc:   Hal T. Stoneking, M.D.  Hermelinda Medicus, M.D.

## 2010-08-20 NOTE — H&P (Signed)
[name], [name]           ACCOUNT NO.:  [id number]   MEDICAL RECORD NO.:  [id number]          PATIENT TYPE:  INP   LOCATION:  1827                         FACILITY:  [facility]   PHYSICIAN:  Hollice Espy, M.D.DATE OF BIRTH:  [birth year]   DATE OF ADMISSION:  06/03/2005  DATE OF DISCHARGE:                                HISTORY & PHYSICAL   PRIMARY CARE PHYSICIAN:  Darius Bump, M.D.   CHIEF COMPLAINT:  Shortness of breath.   HISTORY OF PRESENT ILLNESS:  The patient is a 77 year old white female, past  medical history of poorly controlled diabetes, hypertension, CHF, and COPD,  who presents after a one-day history of chest tightness and shortness of  breath.  The patient states she had been previously been doing well at her  normal baseline.  When all of a sudden today she felt some midsternal chest  aching, followed by sudden onset severe shortness of breath.  She notes,  however, for the past few weeks she has been feeling slowly more short of  breath than her baseline.  She tells me she quite smoking about just a few  months ago, after a long, long history.  The patient came into the emergency  room for further evaluation.  She had an ABG done which noted that she was  indeed hypoxic with a pO2 of 67.  In addition, she was noted to have a  normal pCO2 and bicarb, normal BNP.  Chest x-ray showed no evidence of any  acute infiltrate or severe edema.  The patient had an essentially normal  white count with no shift and given these findings, it was concerning that  she may have pulmonary embolus.  In addition, cardiac enzymes were negative.  The patient underwent a CT scan of the chest and currently reports back are  pending.  Currently, the patient is doing well.   She denies any headaches, vision changes, dysphagia, chest pain,  palpitations, shortness of breath now that she is on O2, wheezing, coughing,  abdominal pain, hematuria, dysuria, constipation, diarrhea,  focal extremity  numbness, weakness, or pain and review of systems otherwise negative.   PAST MEDICAL HISTORY:  1.  CHF.  2.  CAD.  3.  Hypertension, poorly controlled.  4.  Diabetes mellitus, poorly controlled.  5.  Morbid obesity.  6.  She recently just gave up smoking.  7.  She has a history of hypothyroidism.   MEDICATIONS:  The patient is unsure of her doses but she is on aspirin,  insulin which she thinks is NPH 70 units in the morning 50 in the evening,  Accupril, Neurontin, Plavix, Synthroid, and Toprol.   She has no known drug allergies.   SOCIAL HISTORY:  She recently gave up smoking.  She denies any drug or  alcohol abuse.   FAMILY HISTORY:  Noncontributory.   PHYSICAL EXAMINATION:  VITAL SIGNS:  The patient's vitals on admission, temp  97.5, heart rate 62, blood pressure 176/83, respirations 24, O2 sat 86% on  room air, 92% 4 liters.  GENERAL:  The patient is alert and oriented x3 in no apparent distress.  HEENT:  Normocephalic atraumatic.  Mucous membranes are slightly dry.  She  has no carotid bruits.  HEART:  Regular rate and rhythm.  Soft S1 S2.  LUNGS:  Clear to auscultation bilaterally but decreased throughout.  ABDOMEN:  Soft and nontender.  Obese.  Positive bowel sounds.  EXTREMITIES:  No clubbing, cyanosis.  Trace pitting edema.   LABORATORY DATA:  BNP normal at 45.  D-dimer normal at 0.45.  White count  7.1, H&H 14.8 and 42.7, MCV of 88, platelet count 209, no shift.  PTT 37, PT  13.6, INR 1.  Sodium 138, potassium 5.1, chloride __________  , bicarb 21,  BUN 24, creatinine 1.2.  ABG on room air pH 7.42, pCO2 38, pO2 67, bicarb  25.  CPK 154, MB 1.3, troponin I less than 0.05.   ASSESSMENT/PLAN:  1.  Hypoxia.  Question if this is a pulmonary embolus, although noted within      normal limit D-dimmer.  We will await results and if it positive, treat      the patient with Lovenox and Coumadin.  If is normal, then would likely      consider this as  probably a chronic obstructive pulmonary disease      exacerbation and would treat her with nebulizers, possible antibiotics.      At this time, I hear no wheezing, so no reason to start steroids.  2.  In the meantime, in regard to the patient's diabetes.  We will continue      her on a sliding scale.  Put her on NPH and ascertain her home dose as      well as to check a hemoglobin A1c.  3.  Hypothyroidism.  Get her thyroid medication dose and start that.  4.  Coronary artery disease.  Once she is more stable, we will start her      antihypertensives.  We will hold on the beta-blocker for now, until we      have ascertained the cause of her hypoxia.      Hollice Espy, M.D.  Electronically Signed     SKK/MEDQ  D:  06/03/2005  T:  06/03/2005  Job:  [id number]   cc:   Darius Bump, M.D.  Fax: [phone]

## 2010-08-20 NOTE — Discharge Summary (Signed)
NAMELILYROSE, [name]           ACCOUNT NO.:  [id number]   MEDICAL RECORD NO.:  [id number]          PATIENT TYPE:  INP   LOCATION:  6741                         FACILITY:  [facility]   PHYSICIAN:  Hollice Espy, M.D.DATE OF BIRTH:  1928-08-07   DATE OF ADMISSION:  06/03/2005  DATE OF DISCHARGE:  06/05/2005                                 DISCHARGE SUMMARY   PRIMARY CARE PHYSICIAN:  Darius Bump, M.D.   DISCHARGE DIAGNOSES:  1.  Hypoxia secondary to chronic obstructive pulmonary disease exacerbation.  2.  Chronic obstructive pulmonary disease.  3.  Coronary artery disease with a history of congestive heart failure.  4.  Hypothyroidism.  5.  Diabetes mellitus.  6.  Tobacco abuse, recent cessation.   DISCHARGE MEDICATIONS:  The patient will continue all of her previous  medications of aspirin, insulin, Accupril, Neurontin, Plavix, Synthroid and  Toprol.  In addition, we are adding:   1.  Advair 100/50 mcg one puff b.i.d.  2.  Atrovent MDI two puffs four times a day.   HOSPITAL COURSE:  The patient is a 77 year old white female with past  medical history pertinent for diabetes, hypertension, CHF and COPD, who  presented with a one-day history of chest tightness and shortness of breath.  Initially when she presented, her EKGs and cardiac enzymes were normal;  however, by chest x-ray and CT she had no evidence of any acute infiltrate  or any evidence of any pulmonary embolus.  Despite an ABG showing PO2 of 67,  consistent with hypoxia, the patient had improvement with oxygenation.  Follow-up cardiac enzymes and repeat EKG showed no evidence of any acute  cardiac event, and it was felt that her shortness of breath was likely not  secondary to cardiac issues and possibly from her COPD.  She told me that  she had recently quit tobacco, after long use, a few weeks back.  The  patient when ambulated on room air was found to have oxygen desaturations  into the mid-80s.  The  patient was put on around-the-clock breathing  treatments and continued to show signs of improvement.  Over the next  several days she continued to improve and by June 05, 2005, the patient on  room air was saturating 92% with ambulation, 96% at rest.  At this point I  fel that she was medically stable for discharge and added inhalers, both  albuterol and Advair, along with education of use prior to discharge to her  regimen.  She tells me that previously in the past she had been on inhalers  but had used them periodically as needed and no longer had them.  Likely the  patient would benefit from significant adjustments of her medical therapy.  She is not very well-controlled on her diabetes medicines or very compliant  in terms of checking her blood sugars or her diet.  I have recommended close  follow-up for that.  The plan will be for the patient to be discharged to  home.  The rest of her medical issues, including hypertension and diabetes  while here in the hospital, CAD, were all stable.  We also checked a TSH,  which was found to be normal as well.  The plan will be for the patient to  be discharged to home.   DISPOSITION:  Improved.   ACTIVITY:  As tolerated.  Since she is able to saturate now well on room  air, she does not need to go home on any oxygen.  She will follow up with  her PCP, Dr. Madison Hickman, in the next one to two weeks.  No other  medication adjustments have been made.      Hollice Espy, M.D.  Electronically Signed     SKK/MEDQ  D:  06/05/2005  T:  06/06/2005  Job:  [id number]   cc:   Darius Bump, M.D.  Fax: [phone]

## 2010-08-20 NOTE — Discharge Summary (Signed)
NAMELAILANY, ENOCH                       ACCOUNT NO.:  [id number]   MEDICAL RECORD NO.:  [id number]                   PATIENT TYPE:  INP   LOCATION:  4729                                 FACILITY:  [facility]   PHYSICIAN:  Madaline Savage, M.D.             DATE OF BIRTH:  1928-10-04   DATE OF ADMISSION:  10/15/2002  DATE OF DISCHARGE:  10/18/2002                                 DISCHARGE SUMMARY   DISCHARGE DIAGNOSES:  1. Unstable angina, catheterization and percutaneous coronary intervention     and CYPHER stent to the right coronary artery this admission.  2  Previous intervention to the right coronary artery, November of 2002 and  August 2003, with brachytherapy.  1. Good left ventricular function.  2. Insulin-dependent diabetes with neuropathy.  3. Hypertension.  4. Hyperlipidemia.  5. Transient renal insufficiency, stable at discharge.  6. Bilateral carotid bruits.  7. Lower extremity edema.   HOSPITAL COURSE:  The patient is a 77 year old female followed by Dr.  Madaline Savage and now by Dr. Darius Bump.  She was admitted October 15, 2002 with unstable angina.  She has had previous interventions to the  RCA.  The patient was admitted Dr. Cristy Hilts. Ganji.  She was started on IV  heparin.  CK-MB and troponins were negative.  She was set up for a  diagnostic catheterization to be done October 16, 2002.  Her BUN was high on  admission at 28 with a creatinine of 1.7 and her Lasix was held.  She was  given Mucomyst also.  Catheterization revealed severely diffuse disease in  the RCA and patent stent site from August 2003 and November of 2003.  There  was a thrombotic lesion in the distal RCA with some diffuse distal disease  after this.  The patient underwent CYPHER stenting by Dr. Jacinto Halim.  Post  procedure, she did have a vagal episode that responded to atropine and  fluids.  She was transferred to telemetry and monitored.  We feel she can be  discharged October 18, 2002.  She  did admit that she had not been taking her  statin as an outpatient and we resumed this prior to discharge.  We will  also send her home on a lower dose of Lasix at discharge for chronic lower  extremity edema.  Also noted on admission were carotid bruits and she will  need carotid Dopplers as an outpatient.  She has an appointment to see Dr.  Elsie Lincoln already and will keep this, date of discharge, October 18, 2002.   DISCHARGE MEDICATIONS:  1. Plavix 75 mg a day indefinitely.  2. Aspirin 81 mg a day.  3. Synthroid 0.025 mg a day.  4. Imdur 60 mg a day.  5. Toprol-XL 100 mg a day.  6. Neurontin 300 mg twice a day.  7. Lipitor 20 mg a day.  8. Accupril 20 mg a day.  9.  NPH 72 units in the morning, 54 units in the evening.  10.      Nitroglycerin sublingual p.r.n.   LABORATORY DATA:  Sodium 143, potassium 3.9, BUN 13, creatinine 1.2.  White  count 7.9, hemoglobin 13.6, hematocrit 39.7, platelets 201,000.  INR of 0.9.  CK-MB and troponins are negative x2.  Cholesterol 253, triglycerides 268,  HDL 41, LDL 158.  TSH 3.026.   PA and lateral chest x-ray showed no acute disease but evidence of COPD.   EKG showed sinus rhythm without acute changes.   DISPOSITION:  The patient is discharged in stable condition and will keep  her followup appointment with Dr. Elsie Lincoln.  She needs outpatient Dopplers and  this will be arranged.  She also knows to follow up with Dr. Daphine Deutscher  regarding her diabetes.     Abelino Derrick, P.A.                      Madaline Savage, M.D.    Lenard Lance  D:  10/18/2002  T:  10/19/2002  Job:  [id number]   cc:   Darius Bump, M.D.  [address] [place]  [place] [address]  Fax: [phone]    cc:   Darius Bump, M.D.  [phone] [address]  [place] [address]  Fax: [phone]

## 2010-08-20 NOTE — Cardiovascular Report (Signed)
[name], [name]                       ACCOUNT NO.:  [id number]   MEDICAL RECORD NO.:  [id number]                   PATIENT TYPE:  INP   LOCATION:  4708                                 FACILITY:  [facility]   PHYSICIAN:  Cristy Hilts. Jacinto Halim, M.D.                  DATE OF BIRTH:  12/07/1928   DATE OF PROCEDURE:  11/29/2001  DATE OF DISCHARGE:                              CARDIAC CATHETERIZATION   NOTATION:  The procedure was performed by Dr. Yates Decamp and brachytherapy  administered by Dr. Elmer Sow. Wu.   PROCEDURE PERFORMED:  1. Cutting Balloon angioplasty of the in-stent re-stenosis of the right     coronary artery.  2. Brachytherapy of the in-stent re-stenotic segment of the mid right     coronary artery.   INDICATION:  The patient is a 77 year old female with a history of coronary  artery disease, hypertension, hypercholesterolemia, diabetes mellitus, was  admitted to the hospital for chest pain suggestive of  unstable angina. She  underwent diagnostic cardiac catheterization on November 28, 2001, which  revealed a high-grade in-stent re-stenosis of the mid right coronary artery.  Also the inflow of the stent and the outflow of the stent had 90% and 80%  stenosis. Hence, she was brought back to the cardiac catheterization  laboratory for balloon angioplasty followed by brachytherapy of the right  coronary artery.   INTERVENTIONAL DATA:  Successful PTCA using a 2.75 x 10 mm Cutting Balloon.  The mid right coronary artery stenosis, including the inflow and outflow of  the stent was reduced from a total of 95% to less than 10%. There was no  evidence of dissection nor thrombosis at the end of the procedure. There was  sluggish flow noted in a small RV acute marginal branch.   Successful brachytherapy with a Galileo catheter using a 2.5 x 52 mm  catheter.   RECOMMENDATIONS:  Continue aggressive risk factor modification is indicated.  Plavix long-term is indicated. Continued  aspirin. If patient remains stable  can be discharged home, discharged home tomorrow or on Sunday.   TECHNIQUE OF PROCEDURE:  Under the usual sterile precautions, using a 7  French right femoral artery access, a 7 [place] FR4 with side-holes catheter  was advanced into the ascending aorta over a 0.035 inch J wire. The right  coronary artery was selectively engaged and angiography was performed. Then  a 300 cm x 0.014 inch luge wire was advanced into the right coronary artery  and the tip of the wire was carefully positioned in the distal right  coronary artery. Then a 2.75 x 10 mm Cutting Balloon was advanced into the  right coronary artery and multiple Cutting Balloon angioplasty was performed  in the mid right coronary artery, including the inflow of the stent and  outflow of the stent. After confirming success of the procedure, the Cutting  Balloon was withdrawn into the guiding catheter. Intracoronary nitroglycerin  was administered and angiography was performed. Then the Cutting Balloon was  withdrawn out of the body. Then a 2.5 x 52 mm Galileo brachytherapy catheter  was advanced into the right coronary artery and after confirming the  position of the catheter, and inflating the catheter with normal saline at 4  atmospheres of pressures brachytherapy was delivered.   Then the catheter was pulled back into the guide catheter and outside of the  body and intracoronary nitroglycerin was administered and angiography was  performed. Excellent results were noted. The patient tolerated the procedure  well.   [place] during the procedure, the patient did have an episode of nausea,  which caused her to have vagal symptoms. However, this rapidly resolved with  4 mg of Zofran intravenous administration.   The patient remained stable.                                                    Cristy Hilts. Jacinto Halim, M.D.    Pilar Plate  D:  11/29/2001  T:  12/02/2001  Job:  [id number]   cc:   Hinda Glatter, M.D.  [address] [place], [place] [address]  Fax: [phone]

## 2010-08-20 NOTE — Cardiovascular Report (Signed)
[place]  Patient:    [name], [name] Visit Number: [id number] MRN: [id number]          Service Type: Attending:  Delrae Rend, M.D. Dictated by:   Delrae Rend, M.D. Proc. Date: 03/02/01   CC:         Richard A. Jacky Kindle, M.D.             [facility]                        Cardiac Catheterization  PROCEDURE: 1. Selective left ventriculography. 2. Selective left and right coronary arteriography. 3. Percutaneous transluminal coronary angioplasty and stenting of the    right coronary artery. 4. Intravenous heparin, intravenous Integrelin, intracoronary nitroglycerin    administration. 5. Abdominal aortogram with femoral runoff to evaluate the patency of    the iliac arteries secondary to difficulty advancing the wire.  SURGEON:  Delrae Rend, M.D.  REFERRING PHYSICIAN:  Richard A. Jacky Kindle, M.D.  INDICATIONS:  The patient is a 77 year old female with a history of hypertension and hypercholesterolemia who was admitted to the hospital with non-Q wave myocardial infarction.  She was brought to the cardiac catheterization lab to evaluate her coronary anatomy.  HEMODYNAMIC DATA:  The left ventricular pressure was 119/9 with and end-diastolic pressure of 14 mmHg.  The central aortic pressures were 118/57 with a mean of 82 mmHg.  There was no pressure gradient across the aortic valve.  LEFT VENTRICULOGRAM:  Left ventriculogram was performed both RAO and LAO projection.  There was normal left ventricular systolic function.  Ejection fraction is 60-65%.  There is mitral regurgitation.  ANGIOGRAPHIC DATA:  Right coronary artery:  The right coronary artery is a dominant vessel.  After origin of the RV branch, there is a hazy lesion which constitutes about 90-95% and these are tandem lesions.  There also appears to be hazy and appears to be filled with clot.  Distally, the right coronary artery bifurcates  superiorly.  Left main coronary artery:  The left main coronary artery is a large caliber vessel. It bifurcates into the circumflex and left descending artery.  Circumflex artery:  The circumflex artery is a large caliber vessel.  It continues distally as a small AV groove branch after giving origin to a moderate size obtuse marginal I and a large obtuse marginal 2.  The obtuse marginal 2 has a hazy 30-40% stenosis.  Left anterior descending artery:  The left anterior descending is a large caliber vessel.  It gives origin to a moderate size diagonal I and continues towards the apex and ends at the apex.  It is disease free.  ABDOMINAL AORTOGRAM:  The abdominal aortogram and femoral run-off revealed widely patent iliacs.  No evidence of abdominal aortic aneurysm.  There are mild atherosclerotic changes noted.  INTERVENTIONAL DATA:  Successful PTCA and stenting of the right coronary artery after predilatation with a 2.5 x 20 mm CrossSail and OpenSail balloon.  Stenting with a 2.75 x 23 mm Ceta was performed at 12 atmospheres pressure. The stenosis was reduced from 95% to -10% with TIMI-3 to TIMI-3 flow.  There was distal spasm noted which was broken by intracoronary nitroglycerin and low pressure balloon dilatation at 3 atmospheres of pressure for 40 seconds.  RECOMMENDATIONS:  Further recommendations include risk factor modification including control of hypertension, aggressive control of diabetes and hypercholesterolemia.  DESCRIPTION OF PROCEDURE:  Under the usual sterile precautions, using 6 French right  femoral artery access, a 6 French diagnostic left Judkins catheter was carefully direct to the ascending artery with 0.025 mm J-wire.  The catheter was gently advanced into the left ventricle.  The left ventricular pressures were monitored.  Hand contrast injection of the left ventricle was performed both in LAO and RAO projections.  The catheter was then flushed with saline and  pulled back into the ascending aorta and pressure gradient across the aortic valve was monitored.  The right coronary artery was selectively engaged and arteriography was performed.  Then the catheter was pulled out of the body.  Then the 6 French diagnostic left Judkins catheter was advanced into the ascending aorta with a 0.025 mm J-wire.  The catheter was larger, hence it was exchanged to a 3.6 [place] Judkins left 3.5 catheter.  The left coronary artery was selectively engaged, and arteriography was performed.  The catheter was then pulled back into the ascending aorta.  Abdominal aortogram was performed at the end of the interventional procedure to evaluate for iliac patency as we had a difficult time advancing the wire, and also the patient complained of claudication.  Interventional technique:  The 6 French right femoral artery sheath was exchanged for a 7 [place] sheath.  Then a 7 French 54 guide with side-holes was advanced into the ascending aorta with 0.025 mm J-wire.  The catheter was manipulated to engage the right coronary artery.  Then, the 90 cm x 0.014 mm luge wire was advanced into the right coronary artery and the tip of the wire easily crossed the lesion, and the tip of the wire was carefully pushed into the distal right coronary artery.  Then a 2.5 x 20 mm CrossSail balloon was advanced into the right coronary artery and balloon dilatation was performed at 8 atmospheres of pressure for one minute.  The balloon was deflated and pulled back into the guiding catheter and arteriography was performed. Because of persistence of greater than 705 stenosis and persistence of haziness, it was decided to proceed with stenting.  Prior to balloon dilatation, direct stenting was attempted with a 2.75 x 23 mm Ceta stent.  We had difficulty crossing the lesion.  The stent was again reattempted to cross the lesion, however, it was difficult in spite of the predilatation with the  balloon.  Then the stent was taken out  of the body.  A second predilatation with the 2.5 x 20 mm CrossSail balloon was again performed at 10 atmospheres of pressure.  The balloon was pulled back into the guiding catheter and exchanged to this stent.  The stent was again attempted to deploy, however, we were unable to cross the lesion.  Then, the stent was taken out of the body.  Then a 300 cm x 0.014 mm Mailman guidewire was utilized, and the right coronary artery was attempted to cross. However, we had difficulty crossing the lesion.  Hence, a 2.5 x 20 mm OpenSail balloon was advanced over the 190 cm luge wire and after positioning the OpenSail balloon in the distal right coronary artery, the 190 cm luge wire was removed out of this balloon.  Then a 300 cm x 0.014 mm Mailman guidewire was advanced into the Opensail balloon, and the tip of the wire was carefully positioned in the distal right coronary artery.  Then the balloon was pulled back at the lesion and after confirming the placement of the balloon, another inflation at 10 atmospheres pressure for 30 seconds was performed, and the balloon was pulled  out of the body.  Then, the 2.75 x 22 mm Ceta stent was again reattempted to advance with the Mailman guidewire, and this time the stent easily crossed the lesion.  After confirming the position of the stent, the stent was deployed initially at 12 atmospheres of pressure.  The balloon was deflated.  Another inflation at 10 atmospheres of pressure was performed for one minute intervals.  The balloon was deflated, pulled back into the guiding catheter and arteriography was performed.  There was distal coronary spasm to the distal end of the stent and hence intracoronary nitroglycerin was administered, a total of 400 mcg.  Because of the persistence of spasm, the 2.75 x 23 mm stent balloon was advanced into the mid to distal segment of the right coronary artery and low pressure balloon  dilatation at 3 atmospheres of pressure was performed for 30 seconds and then the balloon was deflated, pulled back into the guiding catheter, and arteriography was performed.  There was excellent flow noted, excellent results noted, and after confirming the success the guidewire was removed out of the body.  Angiography was performed, and the catheter was disengaged from the right coronary artery and pulled out of the body with 0.035 mm lumen J-wire. Abdominal aortogram was performed with the reintroduced with 6 [place] multipurpose B2 catheter.  The catheter was pulled out of the body.  Arterial access sheath was sutured in place and the patient was transferred to the floor in stable condition.  During the procedure, the ACT was closely monitored.  The patient tolerated the procedure well. Dictated by:   Delrae Rend, M.D. Attending:  Delrae Rend, M.D. DD:  03/02/01 TD:  03/02/01 Job: [id number]

## 2010-08-20 NOTE — Cardiovascular Report (Signed)
[name], [name]                       ACCOUNT NO.:  [id number]   MEDICAL RECORD NO.:  [id number]                   PATIENT TYPE:  INP   LOCATION:  6531                                 FACILITY:  [facility]   PHYSICIAN:  Cristy Hilts. Jacinto Halim, M.D.                  DATE OF BIRTH:  1929-02-07   DATE OF PROCEDURE:  10/16/2002  DATE OF DISCHARGE:                              CARDIAC CATHETERIZATION   ATTENDING CARDIOLOGIST:  Madaline Savage, M.D.   PROCEDURE PERFORMED:  1. Left ventriculography.  2. Selective right and left coronary arteriography.  3. Percutaneous transluminal coronary angioplasty and direct stenting of the     mid right coronary artery.  4. Intracoronary nitroglycerin administration.   INDICATION:  [name] is a 77 year old female with history of  hypertension, diabetes, hyperlipidemia, morbid obesity and known coronary  artery disease status post stenting of the mid RCA with a 2.75 x 23-mm Zeta  stent in November 2002 which had restenosed and she underwent successful  brachytherapy on November 29, 2001.  Presents to the emergency room  complaining of chest pain suggestive of classic unstable angina.  Given her  significant cardiac history, she was brought directly to the cardiac  catheterization lab after ruling her out for myocardial infarction to re-  evaluate her coronary anatomy.   HEMODYNAMIC DATA:  1. The left ventricular pressures were 143/8 with end-diastolic pressure of     16 mmHg.  2. Aortic pressure 169/79 with a mean of 115 mmHg.  3. There was no pressure gradient across the aortic valve.   ANGIOGRAPHIC DATA:  Left ventricle:  Left ventricular systolic function was  normal and ejection fraction was estimated at 60%.  There is no significant  mitral regurgitation.   Right coronary artery:  The right coronary artery is a large vessel which is  severely diseased all the way through its origin to the distal bed.  The  Zeta stent which had undergone  brachytherapy on November 29, 2001 in the mid  segment is widely patent.  However, about 3-4 mm distal to the stent there  was an 80% thrombotic stenosis.  The distal RCA has severe diffuse disease  which has progressed from previous cardiac catheterization on November 29, 2001.  The proximal RCA has diffuse 60-70% stenosis which is unchanged from  prior cardiac catheterization.   Left main coronary artery:  Left main coronary artery is a large caliber  vessel. It is normal.   Circumflex coronary artery:  Circumflex coronary artery is a large caliber  vessel.  It gives origin to a moderate size obtuse marginal 1 and large  obtuse marginal 2.  The obtuse marginal 2 has focal 40% stenosis which is  unchanged from prior cardiac catheterization.  There is, again, severe to  mild-to-moderate diffuse disease.   Left anterior descending artery:  Left anterior descending artery is a large  caliber vessel.  It  gives origin to a small size diagonal #1.  The LAD  itself has mild diffuse disease.   INTERVENTION DATA:  Successful direct stenting of the mid RCA with a 2.75 x  13-mm Cypher stent deployed at 16 atmospheres of pressure.  The stenosis was  reduced from 80% to 0% with TIMI-3 to TIMI-3 flow at the end of procedure.  No evidence of dissection, no evidence of thrombus at the end of the  procedure.  The patient tolerated the procedure well.   RECOMMENDATIONS:  The patient will be placed on Plavix indefinitely due to  her severe diffuse disease and her significant cardiac history.  Aggressive  risk factor modification, weight loss is, again, indicated.   TECHNIQUE OF PROCEDURE:  Under usual sterile precautions, using a 6 French  right femoral arterial access, a 6 [place] multipurpose B2 catheter was  advanced into the ascending aorta over a 0.035-inch J wire.  The catheter  was then gently advanced to the left ventricle and left ventricular  pressures were monitored.  Hand contrast injection  of the left ventricle was  performed both in the LAO and RAO projection.  The catheter was flushed with  saline and pulled back into the ascending aorta and pressure gradient across  the aortic valve was monitored.  The right coronary artery was selectively  engaged and angiography was performed.  Then, the catheter was pulled out of  the body in usual fashion and a 6 [place] Judkins left 4 diagnostic catheter  was advanced into the ascending aorta over the 0.035-inch J-wire and the  left main coronary artery was selectively engaged and angiography was  performed.  Then, the catheter was pulled out of the body in the usual  fashion.   TECHNIQUE OF INTERVENTION:  A 7 [place] FR-4 guide with side holes was  utilized to engage the right coronary artery after exchanging the 6 [place]  sheath to a 7 [place] sheath.  Then, a 190 cm x 0.014-inch ATW guide wire was  utilized to cross the mid RCA stenosis and the tip of the wire was carefully  positioned in the distal RCA.  The lesion length to the previous placed  stent was measured.  Then, a 2.75 x 13-mm Cypher stent was advanced over the  same guide wire and after confirming the position of the stent, the stent  was deployed at 16 atmospheres of pressure for 60 seconds.  The balloon was  deflated and pulled back into the guiding catheter.  Intracoronary  nitroglycerin was administered and angiography was performed.  Excellent  results were noted.  The wire was withdrawn, multiple images were re  obtained and then the catheter was disengaged and pulled out of the body in  usual fashion.  The patient tolerated the procedure well.                                                Cristy Hilts. Jacinto Halim, M.D.    Pilar Plate  D:  10/16/2002  T:  10/17/2002  Job:  [id number]  Madaline Savage, M.D.  [phone] [address], [address]  [place]  [place] [address]  Fax: [phone]   [place]. Daphine Deutscher, M.D.  [address], [address]  [place] [address]  Fax: [phone]    cc:   Madaline Savage, M.D.  [address],  [address]  [place]  [place] [address]  Fax: [phone]   [place]. Daphine Deutscher, M.D.  [address], [address]  [place]  [place] [address]  Fax: [phone]

## 2010-08-20 NOTE — Discharge Summary (Signed)
[place]  Patient:    [name], [name] Visit Number: [id number] MRN: [id number]          Service Type: MED Location: [id number] Attending Physician:  Ophelia Shoulder Dictated by:   [facility] [place], [place]. Admit Date:  02/28/2001 Discharge Date: 03/05/2001   CC:         Richard A. Jacky Kindle, M.D.  Madaline Savage, M.D.   Discharge Summary  ADMISSION DIAGNOSES: 1. Unstable angina. 2. Insulin-dependent diabetes mellitus. 3. Hypothyroidism. 4. Obesity.  DISCHARGE DIAGNOSES: 1. Unstable angina. 2. Insulin-dependent diabetes mellitus. 3. Hypothyroidism. 4. Obesity. 5. Positive subendocardial myocardial infarction. 6. Status post cardiac catheterization 03/02/01 by Dr. Jeanella Cara. 7. Percutaneous transluminal coronary angioplasty with stenting performed to    the mid right coronary artery.  This went from 95% to 10% residual.    The ejection fraction is 60%. 8. Urinary tract infection, treated. 9. Hyperlipidemia, treated.  HISTORY OF PRESENT ILLNESS:  The patient is a 77 year old single white female who presented on 02/28/01 complaining of chest pain.  She stated the chest pain was central, low chest and was worse at approximately 2:30 in the afternoon that day.  It had originally begun at about 11 AM but became worse at 2:30.  At that time she also did have associated nausea with no emesis. She did feel hot, clammy and diaphoretic.  The pain was relieved once she was in the emergency room.  PHYSICAL EXAMINATION:  On examination the patient was afebrile with a pulse of 66, blood pressure 139/65.  Her examination was essentially benign with no significant abnormalities.  ELECTROCARDIOGRAM:  The electrocardiogram showed normal sinus rhythm with no changes.  LABORATORY DATA:  The baseline laboratory studies were all stable.  At that time she was seen and evaluated by Dr. Lavonne Chick.  It was felt that her symptoms were  worrisome for angina though she was now pain-free.  He planned to admit the patient, place her on intravenous heparin, nitroglycerin, aspirin, Plavix, beta blocker, check serial enzymes to evaluate for myocardial infarction and planned for cardiac catheterization on Friday.  HOSPITAL COURSE:  On 03/01/01, cardiac enzymes were slightly elevated. Electrocardiogram remained without significant change.  At this point her enzymes were positive consistent with positive for myocardial infarction, however, she remained pain-free.  Electrocardiogram was unchanged.  She was continued on medications and planned for a cardiac catheterization the next day.  On 03/02/01 the patient underwent cardiac catheterization by Dr. Jeanella Cara.  He performed percutaneous transluminal coronary angioplasty with stenting to the mid right coronary artery.  This went from a 95% stenosis to a 10% residual. There was some distal spasm but this was broken with intravenous nitroglycerin and low pressure balloon dilatation.  Ejection fraction 60%.  The patient tolerated the procedure well without complications.  On 03/03/01 the patient was still feeling weak with plans to have her ambulate during the day.  We would add statin and ACE inhibitor and increase beta blocker.  She was continued on Imdur for the time secondary to spasm at the time of PCI with plan to stop this at the time of her discharge home.  On 03/04/01 the patient still had complaints of weakness but had no cough or dysuria.  Her temperature max had been up to 102.2.  Her lung sounded clear. Her right groin was stable with some ecchymoses but no hematoma or drainage. Urinalysis was positive for nitrates and bacteria.  Therefore, the patient was started  on antibiotic therapy for urinary tract infection.  We planned to observe her for another 24 hours and if afebrile the following morning we would plan for discharge home at that point.  Otherwise the patient  remained hemodynamically stable and stable post catheterization.  On 03/05/01 the patient was stable.  She voiced no specific complaints.  She has remained afebrile and her current temperature is 98.6.  Pulse 84 and blood pressure 100/58.  Oxygen saturations 94% on room air.  Heart is in regular rhythm without significant murmurs, rubs, or gallops.  Lungs are clear and extremities are without edema.  Her groin remains stable.  She continues to have no further chest pain or significant shortness of breath.  ELECTROCARDIOGRAM:  The electrocardiogram shows a new Q wave in lead 3 and aVF which is new.  At this time the patient is seen and evaluated by Dr. Nicki Guadalajara who deems her stable for discharge home.  HOSPITAL CONSULTATIONS:  None.  HOSPITAL PROCEDURES: 1. Cardiac catheterization on 03/02/01 by Dr. Jeanella Cara.  Please see his    dictated catheterization report for further details.    Angiographic data:  Right coronary dominant vessel.  After origin of RV    branch there was a hazy lesion which constituted 90 to 95% and those were    tandem lesions.  There also appeared to be haziness that was filled with    clot.  Distally the right coronary artery bifurcated.  Left main revealed    large caliber vessel.  Bifurcated into left circumflex and LAD. No    significant disease.  Circumflex:  Large vessel. Continued distally as    small AV groove branch after giving origin to moderate sized obtuse    marginal 1 and large obtuse marginal 2. Obtuse marginal 2 had hazy, 30 to    40% stenosis. LAD:  Large vessel.  Gave origin to moderate sized diagonal    1 and continued toward apex, disease free.  Ejection fraction 60 to 65%. 2. Abdominal aortogram with femoral runoff revealed widely patent iliacs. No    abdominal aortic aneurysm.  Mild atherosclerotic changes. 3. Dr. Jacinto Halim then proceeded with successful percutaneous transluminal coronary     angioplasty with stenting of the right coronary  artery.  This went from 95    to 10% residual.  There was distal spasm noted which was broken with intra-    coronary nitroglycerin and low pressure balloon dilatation.  The patient tolerated the procedures well without complications.  Further recommendations included risk factor modification with control of hypertension and better control of diabetes and hypercholesterolemia.  LABORATORY DATA: TSH normal at 2.749.  LIPID PROFILE:  Total cholesterol 260, triglycerides 239, HDL 45, LDL 167. Cardiac enzymes negative.  CK 600 and 683.  MB 110.7 and 105.8. Troponin 3.62, 32.24 and 19.38.  ELECTROLYTES:  On 02/28/01 the sodium is 140, potassium 4.8, chloride 109, glucose 211, BUN 22, creatinine 1.0. These values remained stable throughout the hospitalization and glucose levels actually became more well controlled. On admission the protime was 13.5 with INR 1.0.  CBC:  On 02/28/01 the white blood cell count was 12.5, hemoglobin 14.4, hematocrit 42.2 and platelet count 216K.  Total bilirubin 1.0, alkaline phosphatase 102, ALT 28, AST 75, albumin 3.1.  ELECTROCARDIOGRAM:  On admission the electrocardiogram showed normal sinus rhythm with no ST-T changes. Electrocardiogram by the time of discharge home showed normal sinus rhythm with Q waves in leads 3, aVF which were new.  DISCHARGE MEDICATIONS:  Please note that at the time of discharge home the patient requested aid with her medications secondary to financial distress. However, it was reported by the daughter after the fact that she was able to afford the medications.  1. Plavix 75 mg once q.d.:  We contacted our office and were informed that we    had samples for 20 days worth of this medication.  Therefore it was    arranged that she would go to the office and get samples for 20 days    worth of Plavix 75 mg. To complete her 30 day course she was also given a    prescription for 10 more tablets worth. 2. Tequin 250 mg once a day for  five more days (these were supplied by the    hospital per case management assistance). 3. Accupril 5 mg twice a day. 4. Lopressor 100 mg twice a day. 5. Neurontin 300 mg twice a day. 6. Synthroid (questionable dose) 200 mcg once q.d. 7. Humulin 40 units in the morning, 15 units in the evening. 8. Enteric coated aspirin 325 mg once a day. 9. Zocor 40 mg one each night.  ACTIVITY:  No strenuous activity, lifting more than 5 pounds, driving for thee days.  DIET:  Low fat, low salt, low cholesterol diet.  WOUND CARE:  May wash with warm water and soap.  FOLLOW UP:  Patient is to call the office at [phone] if any bleeding or increased pain in the groin.  Patient is to follow up with Dr. Elsie Lincoln on 03/20/01 at 2:00 in the afternoon.  The appointment is actually with Mancel Bale, PA. Dictated by:   [facility] [place], [place]. Attending Physician:  Ophelia Shoulder DD:  03/05/01 TD:  03/05/01 Job: [id number]

## 2010-08-20 NOTE — Consult Note (Signed)
[name], Higgins           ACCOUNT NO.:  [id number]   MEDICAL RECORD NO.:  [id number]          PATIENT TYPE:  EMS   LOCATION:  MAJO                         FACILITY:  [facility]   PHYSICIAN:  Coletta Memos, M.D.     DATE OF BIRTH:  08-22-1928   DATE OF CONSULTATION:  01/12/2006  DATE OF DISCHARGE:                                   CONSULTATION   CHIEF COMPLAINT:  1. Neck pain.  2. C6 lamina fracture.   INDICATIONS:  [name] is a woman who while walking today fell  down.  She is not sure why she fell or how she fell, but she struck the back  of her head on a bumper of a car.  She had neck pain and then was brought to  the emergency room by EMS.  She has had full use of her extremities during  this period of time.  She told EMS at the scene that she misjudged the curb  at a restaurant, and she fell to the ground hitting her head on the truck  bumper.  No seizure activity was noted.  She was alert, oriented x4, and  answering all questions when EMS arrived.   PAST MEDICAL HISTORY:  Significant for diabetes, hypertension.   She takes Accupril, aspirin, Reglan, Plavix, and Synthroid.   She does not smoke.  She does not have a history of substance abuse.   She has no known drug allergies.   EXAMINATION:  VITAL SIGNS:  Temperature of 97.2, blood pressure 142/74,  pulse is 63, respiratory rate is 20.  She is 89% saturation on room air, 93%  on 2 liters O2.  Blood sugar on admission was 120.  NEUROLOGIC:  She is alert, oriented x4, and answering all questions  appropriately.  She is lying on a stretcher with dried blood around her  mouth.  She has very dry skin in the lower extremities, also in the upper  extremities.  No bruising is noted.  She has normal strength in the upper  and lower extremities.  She does have the middle finger on the right hand  wrapped, and it appears to be splinted.  She says she broke that a few weeks  ago when she fell off of the examination  table at her doctor's office.  She  has intact light touch, intact proprioception in the upper and lower  extremities.  Downgoing toes to plantar stimulation.  Reflexes not elicited.  She has normal muscle tone/bulk on manual exam with her lying down on  stretcher and normal coordination.  Pupils equal, round, and react to light.  Funduscopic exam shows sharp optic disks.  She has full visual fields.  Tongue and uvula in the midline.  Shoulder shrug is normal.  Symmetric  facies and symmetric facial sensation.   On CT exam, [name] has a cervical spine fracture which is on the right  side of the lamina at C6.  It is minimally displaced.  Her overall alignment  is quite good.  She has no bone within the spinal canal.  It was the only  injury.  She  does have spondylitic changes present in the cervical spine  which are old.   DIAGNOSIS:  Cervical spine fracture.  Normal neurologic examination status-  post head fall.  Head CT was also reviewed, and that was normal.  Ms.  Higgins needs to wear a cervical collar for the next 3 months.  She needs  to be seen in my office in 1 month with repeat cervical spine films.  She  needs to be given another set of pads so that she can change those pads when  she showers or bathes.  These instructions were given to the emergency room  nurse.           ______________________________  Coletta Memos, M.D.     KC/MEDQ  D:  01/12/2006  T:  01/13/2006  Job:  [id number]

## 2010-08-20 NOTE — H&P (Signed)
NAME:  [name], [name]           ACCOUNT NO.:  [id number]   MEDICAL RECORD NO.:  [id number]          PATIENT TYPE:  INP   LOCATION:  1401                         FACILITY:  [facility]   PHYSICIAN:  Melissa L. Ladona Ridgel, MD  DATE OF BIRTH:  [birth year]   DATE OF ADMISSION:  04/21/2006  DATE OF DISCHARGE:                              HISTORY & PHYSICAL   PRIMARY CARE PHYSICIAN:  Dr. Lynelle Doctor at [facility].   CHIEF COMPLAINT:  Fall with cervical fracture.   HISTORY OF PRESENT ILLNESS:  The patient is a 77 year old white female  who said she was walking over to her vanity while she was changing her  pajamas, when she suddenly fell, slipping and falling against her bed.  She states that there was some associated dizziness with this episode,  but she denies loss of consciousness.  She was brought to the emergency  room after being found by a friend and examined and was found to have  displacement of a previous cervical spine fracture as well as  dehydration, hypotension and urinary tract infection.  [facility] were asked to admit the patient for further care.   REVIEW OF SYSTEMS:  She denies nausea or vomiting.  She states she has  not been eating or drinking well.  She does state she had some dizziness  before the episode, but she denies any loss of consciousness.  She has  chronic numbness in the left 4th and 5th digits of her left hand and  chronic numbness between the 1st and 2nd toes of the right foot.  All  other review of systems appears to be negative.   PAST MEDICAL HISTORY:  1. Diabetes.  2. A history of renal disease.  3. Urinary tract infection for which she was evaluated in the      outpatient setting and started on Bactrim.  4. Myocardial infarction, evaluated in the past by Dr. Elsie Lincoln.  5. Hypothyroidism.  6. Hyperlipidemia.  7. C6 lamina fracture back in October.  8. Insulin-dependent diabetes.  9. COPD.  10.Unstable angina.   PAST SURGICAL HISTORY:  Cardiac  catheterization in 2002 with a Cypher  stent placed to the RCA.   SOCIAL HISTORY:  She quit tobacco approximately several months ago; she  was not able to tell me how long she had used tobacco.  She denies  ethanol abuse.  She worked as a Associate Professor at [employer].  She has  no children.   FAMILY HISTORY:  Mom had a stroke.  Dad had a myocardial infarction.   ALLERGIES:  No known drug allergies.   MEDICATIONS:  She gets her medications from [facility] on [address].  I  spoke with them and confirmed that she takes:  1. Toprol-XL 100 mg daily.  2. Aspirin.  3. Quinapril was just filled this week, 40 mg daily.  4. Hydrocodone 5/500 mg b.i.d.  5. Humulin N 55 units in the a.m., 35 units in the p.m.  6. Neurontin 300 mg b.i.d.  7. Synthroid 25 mcg daily.  8. Plavix 75 mg a day.  9. Vytorin -- the dose is unknown as  she has not had that for several      months, although she says she has been getting samples.  10.She was recently started 3 days ago on Bactrim double strength      b.i.d.   PHYSICAL EXAMINATION:  VITAL SIGNS:  Temperature is 97.5, blood pressure  107/42, pulse 66, respirations 19 and saturation 99%.  GENERAL:  She is awake, alert and oriented x3, currently in a [place] J-  collar.  HEENT:  She is normocephalic with left occipital tenderness.  Her pupils  are equal, round and reactive to light.  Extraocular muscles are intact.  Mucous membranes are moist.  NECK:  Her collar is in place.  Neck cannot be evaluated for suppleness  secondary to the collar.  She has no lymph nodes and at this time, I am  unable to auscultate for carotid bruits.  CARDIOVASCULAR:  Regular rate and rhythm, positive S1 and S2, no S3 or  S4, no murmurs, rubs, or gallops.  ABDOMEN:  Obese, nontender and nondistended with positive bowel sounds.  EXTREMITIES:  No clubbing, cyanosis or edema.  NEUROLOGIC:  She is awake, alert and oriented.  Cranial nerves II-XII  appear to be intact.  She does,  however, have symptoms of numbness and  probable neuropathy related to her cervical spine disease, which has  been chronic since her last hospital admission for her neck.  Involvement is of the 4th and 5th digits of the left hand and the space  between the 1st and 2nd digits of the foot.  Otherwise, power appears to  be 5/5.  DTRs are 2.  Plantars are equivocal.  Sensation is grossly  intact.   LABORATORY DATA:  Urinalysis is negative for nitrates, negative for  leukocyte esterase.  Sodium is 140, potassium 4.3, chloride 108, CO2 is  22, BUN is 27, creatinine 3.42; back in March of 2007, her creatinine  was 1.2.  Her glucose is 113.  White count is 11.4 with a hemoglobin of  15.4, hematocrit 45.7 and platelets of 204,000.  Cardiac markers are  noted to have elevated myoglobin, but no elevations in her troponin.   RADIOLOGIC FINDINGS:  Review of her chest x-ray shows mild interstitial  infiltrates and small right effusion.  There is right lower lobe  atelectasis with no consolidation.   Imaging of the C spine shows a C6 facet fracture displaced from previous  injury with subluxation encroaching on C6 and C7.   ASSESSMENT AND PLAN:  The patient is a 77 year old white female, status  post fall without loss of consciousness, but with some dizziness prior.  The patient was recently started on antibiotics for urinary tract  infection and has had decreased oral intake for both food and drink.  The patient was found to be hypotensive with increased creatinine,  suggesting acute on chronic renal insufficiency.   1. Cardiovascular:  History of coronary artery disease with a Cypher      stent to the right coronary artery, currently on aspirin and      Plavix.  I will plan to continue her aspirin and Plavix for now      with her elevated creatinine and we will hold her ACE inhibitor.     Please note the patient has been on Bactrim, which could account      for the elevation in her creatinine;  however, she was significantly      orthostatic and therefore this also needs to be considered as a  source.  I will continue her Toprol if her blood pressure will      allow.  We will check serial cardiac markers to rule out myocardial      infarction.  We will check a lipid panel.  2. Pulmonary:  History of chronic obstructive pulmonary disease and      mild vascular congestion on her chest x-ray.  We will very gently      rehydrate and follow her clinically.  3. Cervical spine fracture with perched facets:  I spoke with Dr.      Jeral Fruit.  There are no procedures planned at this time.  We will      just continue with her Aspen collar and they will consult at a      later time.  4. Gastrointestinal:  No complaints.  I will, however, start Protonix.  5. Genitourinary:  Urinary tract infection, for which she has been on      Bactrim.  We will check her to Cipro, in light of her increased      white blood cells and check a culture along with a UA.  6. Endocrine:  We will continue Synthroid at 25 mcg.  For her      diabetes, I will use half doses of her Humulin N, since she is not      eating, and titrate appropriately with a sliding scale.  7. Neuropathy:  We will continue her Neurontin b.i.d.      Melissa L. Ladona Ridgel, MD  Electronically Signed     MLT/MEDQ  D:  04/22/2006  T:  04/22/2006  Job:  [id number]   cc:   Lavonda Jumbo, M.D.

## 2010-08-20 NOTE — Discharge Summary (Signed)
NAME:  [name], [name]           ACCOUNT NO.:  [id number]   MEDICAL RECORD NO.:  [id number]          PATIENT TYPE:  INP   LOCATION:  1401                         FACILITY:  [facility]   PHYSICIAN:  Corinna L. Lendell Caprice, MDDATE OF BIRTH:  [birth year]   DATE OF ADMISSION:  04/21/2006  DATE OF DISCHARGE:  04/27/2006                               DISCHARGE SUMMARY   DISCHARGE DIAGNOSES:  1. Status post fall resulting in C6 facet fracture displaced from      previous injury with subluxation encroaching on C6 and C7.  2. Acute renal insufficiency suspect prerenal azotemia, resolved.  3. Peripheral neuropathy.  4. Diabetes.  5. Chronic renal insufficiency.  6. Recent urinary tract infection.  7. Hypothyroidism.  8. Chronic obstructive pulmonary disease.  9. History of coronary artery disease with stent placement.   DISCHARGE MEDICATIONS:  1. Her Neurontin can be increased to 300 mg p.o. t.i.d. and up-      titrated as tolerated for her paresthesias.  2. Synthroid continued at 25 mcg a day.  3. Plavix 75 mg a day.  4. Aspirin 81 mg a day.  5. NPH Insulin 55 units subcutaneously in the morning and 35 units      subcutaneously in the evening.  6. Vicodin 1-2 p.o. q.4 hours p.r.n. pain.  7. Tylenol 650 mg p.o. q.4 hours p.r.n. pain.  8. Toprol XL 100 mg a day.  9. Quinapril has been stopped due to the renal insufficiency.   CONDITION ON DISCHARGE:  Stable.   CONSULTATIONS:  None.   PROCEDURE:  None.   ACTIVITY:  Continue physical therapy and occupational therapy with  rolling walker and continued use of the hard neck brace until follow-up  with Coletta Memos, M.D.  She should follow up with primary care  physician in 1-2 weeks to monitor blood pressure and basic metabolic  panel.  Follow up with Dr. Franky Macho in 2-4 weeks.   DIET:  Diabetic, low salt.  She should have her blood sugars monitored  at least daily.   LABORATORY DATA:  Rapid Strep screen negative.  CBC on admission  significant for a white blood cell count of 11,000, otherwise  unremarkable.  BMET on admission was significant for a BUN of 47,  creatinine of 3.42.  At discharge her BUN is 15 and creatinine 1.19.  Liver function tests unremarkable except for an albumin of 3.2.  Cardiac  enzymes were negative.  UA showed negative nitrite, negative leukocyte  esterase, negative protein, large bilirubin, trace ketones, blood  cultures negative, urine culture negative.   SPECIAL STUDIES AND RADIOLOGY:  Chest x-ray on admission showed mild  cardiomegaly, nothing acute.  CT of the C-spine showed anterior  subluxation of C6 compared to C7 with also perched facets at C5-6 which  is new finding from prior study and now nondisplaced lamina and facet  fractures at C6 which are now displaced as compared to previous CAT  scan.  No vertebral body fractures.  Advanced degenerative cervical  spondylosis with degenerative disk disease most notably at C4-5, C5-6,  and C6-7.  CT of brain; nothing acute.   HISTORY OF  PRESENT ILLNESS:  Ms. Hayse is a pleasant 77 year old  white female patient of [facility] who had sustained a C-  spine facet fracture several months ago which was being managed  conservatively with neck brace and pain medication.  She lives alone and  had tripped and fallen complaining of neck pain.  She had CT of the C-  spine which showed now displaced facet fracture and other findings as  above.  Dr. Jeral Fruit was contacted in the emergency room and felt that the  patient would require no surgical intervention, but continue the hard  neck collar and physical therapy.  She was therefore admitted to the  medicine service for physical therapy, occupational therapy, and  placement.  Please see H&P for complete admission details.   HOSPITAL COURSE:  She had complaints of numbness in the fourth and fifth  fingers of both hands as well as the right foot.  Apparently this was  ongoing for several  months.  She was being followed as an outpatient by  Dr. Franky Macho.  She had nonfocal neurologic examination.  On laboratories,  she was found to have a creatinine of 3.42 and elevated BUN.  Lisinopril  and Bactrim had recently been started.  She had a urinary tract  infection diagnosed as an outpatient.  These medications were stopped  and she was given IV fluids.  Her creatinine normalized and her blood  pressure has been within normal limits off the Lisinopril.  She did get  physical therapy, occupational therapy, and had her neck brace refit.  She was otherwise medically stable and was being transferred to skilled  nursing facility for continued physical therapy and occupational  therapy.      Corinna L. Lendell Caprice, MD  Electronically Signed     CLS/MEDQ  D:  04/27/2006  T:  04/27/2006  Job:  [id number]   cc:   Lavonda Jumbo, M.D.  Fax: [phone]   Hal T. Stoneking, M.D.  Fax: [phone]   Coletta Memos, M.D.  Fax: [phone]

## 2010-08-20 NOTE — Cardiovascular Report (Signed)
[name], [name]                       ACCOUNT NO.:  [id number]   MEDICAL RECORD NO.:  [id number]                   PATIENT TYPE:  INP   LOCATION:  4708                                 FACILITY:  [facility]   PHYSICIAN:  Cristy Hilts. Jacinto Halim, M.D.                  DATE OF BIRTH:  [birth year]   DATE OF PROCEDURE:  11/28/2001  DATE OF DISCHARGE:                              CARDIAC CATHETERIZATION   PROCEDURE PERFORMED:  1. Left ventriculography.  2. Selective right and left coronary arteriography.   INDICATIONS FOR PROCEDURE:  The patient is a 77 year old female with a  history of diabetes, hypertension, hypercholesterolemia, who has had PTCA  and stenting of the right coronary artery in November 2002 who presents here  for chest pain suggestive of unstable angina.  Due to her cardiac history  and multiple cardiac risk factors, she was brought to the cardiac  catheterization lab to reevaluate her coronary anatomy.   HEMODYNAMIC DATA:  1. The left ventricular pressures were 158/11 with an end diastolic pressure     of 18 mmHg.  2. The aortic pressures were 158/64 with a mean of 99 mmHg.  3. There was no pressure gradient across the aortic valve.   ANGIOGRAPHIC DATA:  1. Right coronary artery:  The right coronary artery is a large caliber     vessel.  It is a dominant vessel.  The previously placed 2.75 x 23-mm     Zeta stent shows about 95% in-stent long segment restenosis.  2. Left main coronary artery:  The left main coronary artery is a large     caliber vessel.  It is normal.  3. Circumflex coronary artery:  The circumflex coronary artery is a large     caliber vessel.  It continues as a small AV groove branch after giving     origin to a moderate-sized obtuse marginal #1 and a large obtuse marginal     #2.  The obtuse marginal #2 has a mid 40% stenosis which is unchanged     from previous cardiac catheterization.  4. Left anterior descending artery:  The left anterior descending  artery is     a large caliber vessel.  It gives origin to a moderate-sized diagonal #1     and continues toward the apex and ends at the apex.  It has mild disease     in the mid to distal segments.  5. Left ventricle:  The left ventricular systolic function was normal.     Ejection fraction was 60%.  There was no significant mitral     regurgitation.   IMPRESSION:  1. High-grade in-stent long segment restenosis of the right coronary artery     stent placed on March 02, 2001.  2. Otherwise no significant change in her coronary anatomy.  3. Normal left ventricular systolic function.   RECOMMENDATIONS:  1. The stent shows a very long segment restenosis  and has shown very     aggressive behavior.  The initial results of the angioplasty of November     2002 showed she had an excellent result with negative 10% residual     stenosis.  As this is a long segment stenosis and it is at most a 2.5-mm     vessel, will directly proceed with cutting balloon angioplasty and     brachytherapy.  This will be scheduled for tomorrow.  2. Continued aggressive risk factor modification is indicated.  The patient     will also be started on Foltx.   TECHNIQUE OF PROCEDURE:  Under usual sterile precautions, using #6 French  right femoral arterial access, a #6 [place] Multipurpose B2 catheter was  advanced to the ascending aorta over a 0.035-inch J wire.  The catheter was  gently advanced to the left ventricle.  Left ventricular pressures were  monitored.  Hand contrast injection of the left ventricle was performed both  in LAO and RAO projection.  Then the catheter was flushed with saline and  pulled back into the ascending aorta and pressure gradient across the aortic  valve was monitored.  The right coronary artery was selectively engaged and  angiography was performed.  Then the catheter was pulled out of the body.  Then a #6 [place] diagnostic Judkins left catheter was advanced to the  ascending aorta  over the 0.035-inch J wire.  The left main coronary artery  was selectively engaged and angiography was performed.  Then the catheter  was pulled out of the body in the usual fashion.  The patient was  transferred to recovery where the sheaths were pulled and manual pressure  was held and adequate hemostasis was obtained.  The patient was transferred  to the floor in a stable condition.  The patient tolerated the procedure  well.                                                Cristy Hilts. Jacinto Halim, M.D.    Pilar Plate  D:  11/28/2001  T:  11/29/2001  Job:  [id number]   cc:   [facility]

## 2010-08-20 NOTE — Discharge Summary (Signed)
NAMECHRYSTEL, [name]                       ACCOUNT NO.:  [id number]   MEDICAL RECORD NO.:  [id number]                   PATIENT TYPE:  INP   LOCATION:  4708                                 FACILITY:  [facility]   PHYSICIAN:  Madaline Savage, M.D.             DATE OF BIRTH:  05/12/1928   DATE OF ADMISSION:  11/27/2001  DATE OF DISCHARGE:  12/02/2001                                 DISCHARGE SUMMARY   ADMISSION DIAGNOSES:  1. Unstable angina.  2. Known coronary artery disease.     a. Status post percutaneous transluminal coronary angioplasty with stent        to the right coronary artery in November 2002.     b. Status post Cardiolite August 2003, hat showed no significant        abnormalities.  3. Insulin-dependent diabetes mellitus.  4. Hypothyroidism.  5. Hyperlipidemia.  6. Recent bilateral hand pain.  7. Chronic obstructive pulmonary disease.   DISCHARGE DIAGNOSES:  1. Unstable angina.  2. Known coronary artery disease.     a. Status post percutaneous transluminal coronary angioplasty with stent        to the right coronary artery in November 2002.     b. Status post Cardiolite in August 2003, that showed no significant        abnormalities.  3. Insulin-dependent diabetes mellitus.  4. Hypothyroidism.  5. Hyperlipidemia.  6. Recent bilateral hand pain.  7. Chronic obstructive pulmonary disease.  8. Status post cardiac catheterization with staged intervention.     a. Status post cardiac catheterization on November 28, 2001, which revealed        in-stent restenosis of the previous right coronary artery stent.        There was also 40% obtuse marginal-2 stenosis and disease in the mid        to distal left anterior descending was unchanged from prior        intervention.  At that time Dr. Jacinto Halim recommended cutting balloon        percutaneous transluminal coronary angioplasty and brachytherapy for        the following day.     b. Status post percutaneous transluminal  coronary angioplasty and        brachytherapy to the right coronary artery on November 29, 2001 ,by Dr.        Yates Decamp.  There was a 90% stenosis just proximal to the previously        placed stent and he used cutting balloon to this which went to a less        than 10% residual.  For the in-stent restenosis he also used cutting        balloon as well as brachytherapy and this went from 95% to less than        10% residual.  Just distal to the previously placed stent, he  performed angioplasty and this went from 80% to less than 10% residual        there.  The patient tolerated the procedure well with no        complications.  9. Status post orthostatic blood pressure drop on December 01, 2001, which     subsequently resolved prior to discharge home.   HISTORY OF PRESENT ILLNESS:  The patient is a 77 year-old white female who  has a history of coronary artery disease status post PTCA/stent to the RCA  in November 2002.  She had a Cardiolite just this August that showed breast  attenuation only and no ischemia.  On Friday, which was about four days  prior to this admission, she was awoken from sleep with midsternal crushing  chest pain associated with diaphoresis, shortness of breath and nausea. She  took one sublingual nitroglycerin and went to sleep.  Since that time she  had also decreased her activity secondary to chest pain.  They have become  more of a constant heaviness now.  She called Dr. Yetta Higgins and he referred her  to [place] for further evaluation.   On exam at that time her blood pressure was 130/60 and heart rate was 68.  No significant abnormalities on physical examination.  EKG showed no acute  change. At that time she was evaluated by Dr. Nicki Guadalajara who felt that she  was experiencing unstable angina, was placed on IV heparin, IV nitroglycerin  as well as home medications including aspirin.  ACE inhibitors and Celebrex  were put on hold in anticipation of cardiac  catheterization.  The risks and  benefits of the procedure were discussed and she was willing to proceed. We  planned to check serial enzymes and other laboratories.   HOSPITAL COURSE:  On the following day the patient remained stable with no  significant chest pain or shortness of breath.   She underwent diagnostic cardiac catheterization by Dr. Jacinto Halim on November 28, 2001.  Of significance she was found to have in-stent long segment  restenosis of a previously placed RCA stent.  Also the OM2 had a 40%  stenosis which was stable.  The mild mid to distal LAD was mildly diseased  but was unchanged from November 2002.  At that time he planned to proceed  with cutting balloon PTCA and brachytherapy the following morning.   On November 29, 2001, Dr. Jacinto Halim proceeded with intervention.  Please see  dictated report for details. It appears that he performed angioplasty to the  RCA just proximal to the previously placed stent and that went from 90% to  less than 10% residual.  It also appears that he performed angioplasty to  the segment just distal to the previously placed stent and this went from  80% to less than 10% residual.  Within the stent he performed angioplasty as  well as brachytherapy and that went from 95% to a less than 10% residual.  The patient tolerated the procedure well and had no complications.  He  planned to continue Integrelin for 18 hours postprocedure and planned to  continue aspirin and Plavix long term.   On the following day the patient remained stable and was doing well.  She  was having no chest pain and no significant problems, but she was feeling  weak and lived alone. Therefore, it was felt that we should monitor her for  one more day prior to her discharge home.  She had had a complex procedure.  On December 01, 2001, originally the patient had been seen by Dr. Tresa Endo and  was planned to discharge home as she was stable at that point.  At that time her heart rate  was 68, blood pressure was 114/50 and her groin site was  stable.   However, at 10 a.m., Dr. Tresa Endo was called back to re-evaluate the patient as  she had developed significant crushing substernal chest pain like the pain  she had prior to her heart pain.  This occurred while attempting to get  dressed.  She took two sublingual nitroglycerin and was now pain-free, but  the pain had lasted for about 15 minutes.  EKG showed no acute change, but  the EKG was obtained once the pain had decreased and almost resolved.  Therefore, at that time, it was felt by Dr. Tresa Endo that the patient should  not go home at that time.  Blood pressure had dropped from 146/60 to 106/62  after nitroglycerin.  He planned to add long acting nitrates with Imdur 60  mg.  He would check enzymes to assure that there was no occlusion of any  vessel.  It was felt that if the chest pain recurred that we would need to  restart IV heparin.   On the morning of December 02, 2001, the patient was stable and had no further  chest pain or shortness of breath.  She is afebrile, has a blood pressure of  120/55, a heart rate of 55 and oxygen saturation of 94% on room air.  Cardiac enzymes are negative.  After her one episode of recurrent chest  pain, the groin site is stable with a hematoma low grade.   At this time she was again evaluated by Dr. Nicki Guadalajara.  The patient's  medications were adjusted and he does deem her stable for discharge home.   HOSPITAL CONSULTS:  None.   HOSPITAL PROCEDURES:  1. Diagnostic cardiac catheterization on November 28, 2001, by Dr. Yates Decamp.     Please see dictated report for details.  At that time it was felt by Dr.     Jacinto Halim that she had a high grade in-stent long segment restenosis of the     RCA stent which had been placed on March 02, 2001.  No other     significant changes in coronary anatomy.  Normal left ventricular     systolic function.   At that time his recommendations were as  follows:  1. Stent to the very long segment of restenosis that has shown very     aggressive thickening.  The initial results of the angioplasty in     November 2002, showed that she had an excellent result with 10% residual     stenosis.  As this is a long segment stenosis and noticed a 2.5 mm     vessel, will directly proceed with cutting balloon angioplasty and     brachytherapy.  This would be scheduled for tomorrow.  2. Continue aggressive risk factor modification is indicated.  Again, see     his dictated report for other details.  3. Cardiac catheterization on November 29, 2001, also by Dr. Jacinto Halim.  This is     his intervention report.  At that time he performed successful PTCA using     a 2.75 x 10 mm cutting balloon.  The RCA stenosis including the inflow     and outflow of the stent was reduced from a total of 95% to less than  10%.  There was no evidence of dissection or thrombosis at the end of the    procedure.  There was sluggish flow noted in the small right ventricular     acute marginal branch.  4. Successful brachytherapy with a Galileo catheter using a 2.5 x 52 mm     catheter.  Again, see the dictated report for details.   EKG on admission showed normal sinus rhythm and nonspecific ST-T change.  As  well the EKG after her one episode of recurrent chest pain showed no  significant abnormality.   LABORATORY DATA:  TSH normal at 1.139.  Total cholesterol 176, triglycerides  54, HDL 82, LDL 83.  Cardiac enzymes negative on initial admission with CK  54, 61 and 51; MB 1.9, 1.9 and 2.4; Troponin 0.02 and 0.04.  Recheck on  August 31, at the time of the recurrent pain showed a CK of 47, a MB of 1.9  and Troponin 0.03.  On admission sodium 139, potassium 4.5, chloride 108,  C02 24, glucose 251, BUN 23, creatinine 1.1; these all remained stable  within the normal range throughout the hospitalization other than some  elevated glucoses intermittently with known diabetes.  On  admission PT was  12.3, INR 0.9, PTT 28; thereafter, the PTT elevated on IV heparin.  On  admission white count was 10.7, hemoglobin 14.3, hematocrit 42.9, platelets  177,000; these remained stable and there was no significant variation in  these laboratories throughout the hospitalization.   DISCHARGE MEDICATIONS:  1. Imdur 60 mg one each morning.  2. Aspirin 81 mg once a night.  3. Plavix 75 mg once a day for six months.  4. Neurontin 300 mg twice a day.  5. Toprol XL 50 mg once a day.  6. Serevent as before.  7. Prednisone as before.  8. Lipitor 20 mg once a day.  9. Synthroid 25 mcg once a day.  10.      Insulin as before.  11.      Lotrel 5/20 mg once a day.   ACTIVITY:  No lifting greater than five ;pounds, driving or sexual activity  for three days.   DIET:  Low-fat, low-salt, low-cholesterol diet.   WOUND CARE:  May shower and gently wash the groin with warm water and soap.   SPECIAL INSTRUCTIONS:  Call [phone] if any bleeding or increased pain at  the groin site.   FOLLOWUP:  Call the office at [phone] on Monday to make an appointment to  see Dr. Elsie Lincoln in two weeks.     Mary B. Easley, P.A.-C.                   Madaline Savage, M.D.    MBE/MEDQ  D:  12/12/2001  T:  12/13/2001  Job:  [id number]   cc:   Armstead Peaks, MD  [address] [place], [place] [address]  Fax: [phone]   Madaline Savage, M.D.  [phone] [address], [address]  [place]  [place] [address]  Fax: [phone]

## 2010-09-03 DIAGNOSIS — Z950 Presence of cardiac pacemaker: Secondary | ICD-10-CM

## 2010-09-03 HISTORY — DX: Presence of cardiac pacemaker: Z95.0

## 2010-09-11 ENCOUNTER — Inpatient Hospital Stay (HOSPITAL_COMMUNITY)
Admission: EM | Admit: 2010-09-11 | Discharge: 2010-09-24 | DRG: 242 | Disposition: A | Discharge status: Skilled Nursing Facility | Payer: Medicare Other | Attending: Internal Medicine | Admitting: Internal Medicine

## 2010-09-11 ENCOUNTER — Emergency Department (HOSPITAL_COMMUNITY): Payer: Medicare Other

## 2010-09-11 DIAGNOSIS — I2510 Atherosclerotic heart disease of native coronary artery without angina pectoris: Secondary | ICD-10-CM | POA: Diagnosis present

## 2010-09-11 DIAGNOSIS — N179 Acute kidney failure, unspecified: Secondary | ICD-10-CM | POA: Diagnosis not present

## 2010-09-11 DIAGNOSIS — N184 Chronic kidney disease, stage 4 (severe): Secondary | ICD-10-CM | POA: Diagnosis present

## 2010-09-11 DIAGNOSIS — E119 Type 2 diabetes mellitus without complications: Secondary | ICD-10-CM | POA: Diagnosis present

## 2010-09-11 DIAGNOSIS — I441 Atrioventricular block, second degree: Secondary | ICD-10-CM | POA: Diagnosis present

## 2010-09-11 DIAGNOSIS — F17200 Nicotine dependence, unspecified, uncomplicated: Secondary | ICD-10-CM | POA: Diagnosis present

## 2010-09-11 DIAGNOSIS — I214 Non-ST elevation (NSTEMI) myocardial infarction: Principal | ICD-10-CM | POA: Diagnosis present

## 2010-09-11 DIAGNOSIS — N390 Urinary tract infection, site not specified: Secondary | ICD-10-CM | POA: Diagnosis present

## 2010-09-11 DIAGNOSIS — I498 Other specified cardiac arrhythmias: Secondary | ICD-10-CM | POA: Diagnosis not present

## 2010-09-11 DIAGNOSIS — E876 Hypokalemia: Secondary | ICD-10-CM | POA: Diagnosis not present

## 2010-09-11 DIAGNOSIS — R339 Retention of urine, unspecified: Secondary | ICD-10-CM | POA: Diagnosis present

## 2010-09-11 DIAGNOSIS — I469 Cardiac arrest, cause unspecified: Secondary | ICD-10-CM | POA: Diagnosis not present

## 2010-09-11 LAB — CBC
HCT: 43.2 % (ref 36.0–46.0)
Hemoglobin: 15.5 g/dL — ABNORMAL HIGH (ref 12.0–15.0)
MCH: 31.4 pg (ref 26.0–34.0)
MCHC: 35.9 g/dL (ref 30.0–36.0)
MCV: 87.6 fL (ref 78.0–100.0)
Platelets: 237 K/uL (ref 150–400)
RBC: 4.93 MIL/uL (ref 3.87–5.11)
RDW: 14.6 % (ref 11.5–15.5)
WBC: 10.3 K/uL (ref 4.0–10.5)

## 2010-09-11 LAB — URINALYSIS, ROUTINE W REFLEX MICROSCOPIC
Bilirubin Urine: NEGATIVE
Glucose, UA: NEGATIVE mg/dL
Hgb urine dipstick: NEGATIVE
Ketones, ur: NEGATIVE mg/dL
Leukocytes, UA: NEGATIVE
Nitrite: NEGATIVE
Protein, ur: 100 mg/dL — AB
Specific Gravity, Urine: 1.016 (ref 1.005–1.030)
Urobilinogen, UA: 0.2 mg/dL (ref 0.0–1.0)
pH: 5.5 (ref 5.0–8.0)

## 2010-09-11 LAB — COMPREHENSIVE METABOLIC PANEL
ALT: 13 U/L (ref 0–35)
AST: 31 U/L (ref 0–37)
Albumin: 3.4 g/dL — ABNORMAL LOW (ref 3.5–5.2)
Alkaline Phosphatase: 112 U/L (ref 39–117)
Calcium: 9.6 mg/dL (ref 8.4–10.5)
Glucose, Bld: 145 mg/dL — ABNORMAL HIGH (ref 70–99)
Potassium: 4.1 mEq/L (ref 3.5–5.1)
Sodium: 142 mEq/L (ref 135–145)
Total Protein: 7.2 g/dL (ref 6.0–8.3)

## 2010-09-11 LAB — TROPONIN I
Troponin I: <0.3 ng/mL
Troponin I: 2.38 ng/mL (ref <0.30)

## 2010-09-11 LAB — URINE MICROSCOPIC-ADD ON

## 2010-09-11 LAB — PRO B NATRIURETIC PEPTIDE: Pro B Natriuretic peptide (BNP): 76.4 pg/mL (ref 0–450)

## 2010-09-11 LAB — PROTIME-INR
INR: 1.09 (ref 0.00–1.49)
Prothrombin Time: 14.3 s (ref 11.6–15.2)

## 2010-09-11 LAB — GLUCOSE, CAPILLARY: Glucose-Capillary: 105 mg/dL — ABNORMAL HIGH (ref 70–99)

## 2010-09-11 LAB — CK TOTAL AND CKMB (NOT AT ARMC)
CK, MB: 42.8 ng/mL (ref 0.3–4.0)
Relative Index: INVALID (ref 0.0–2.5)

## 2010-09-11 LAB — MRSA PCR SCREENING: MRSA by PCR: NEGATIVE

## 2010-09-11 LAB — MAGNESIUM: Magnesium: 2.1 mg/dL (ref 1.5–2.5)

## 2010-09-12 LAB — CBC
Hemoglobin: 14.2 g/dL (ref 12.0–15.0)
Platelets: 186 10*3/uL (ref 150–400)
RBC: 4.61 MIL/uL (ref 3.87–5.11)
WBC: 8.9 10*3/uL (ref 4.0–10.5)

## 2010-09-12 LAB — CARDIAC PANEL(CRET KIN+CKTOT+MB+TROPI)
CK, MB: 103.3 ng/mL (ref 0.3–4.0)
CK, MB: 57.6 ng/mL (ref 0.3–4.0)
Relative Index: 9.8 — ABNORMAL HIGH (ref 0.0–2.5)
Relative Index: 7.1 — ABNORMAL HIGH (ref 0.0–2.5)
Total CK: 1023 U/L — ABNORMAL HIGH (ref 7–177)
Total CK: 808 U/L — ABNORMAL HIGH (ref 7–177)
Troponin I: 10.65 ng/mL (ref <0.30)

## 2010-09-12 LAB — TSH: TSH: 3.567 u[IU]/mL (ref 0.350–4.500)

## 2010-09-12 LAB — LIPID PANEL
HDL: 46 mg/dL (ref >39)
LDL Cholesterol: 116 mg/dL — ABNORMAL HIGH (ref 0–99)
Total CHOL/HDL Ratio: 4.2 RATIO
Triglycerides: 144 mg/dL (ref <150)
VLDL: 29 mg/dL (ref 0–40)

## 2010-09-12 LAB — BASIC METABOLIC PANEL
BUN: 19 mg/dL (ref 6–23)
CO2: 22 mEq/L (ref 19–32)
Chloride: 111 mEq/L (ref 96–112)
Glucose, Bld: 149 mg/dL — ABNORMAL HIGH (ref 70–99)
Potassium: 3.8 mEq/L (ref 3.5–5.1)
Sodium: 143 mEq/L (ref 135–145)

## 2010-09-12 LAB — GLUCOSE, CAPILLARY
Glucose-Capillary: 155 mg/dL — ABNORMAL HIGH (ref 70–99)
Glucose-Capillary: 220 mg/dL — ABNORMAL HIGH (ref 70–99)

## 2010-09-12 LAB — HEMOGLOBIN A1C
Hgb A1c MFr Bld: 7 % — ABNORMAL HIGH (ref <5.7)
Mean Plasma Glucose: 154 mg/dL — ABNORMAL HIGH (ref <117)

## 2010-09-13 HISTORY — PX: CORONARY ANGIOPLASTY WITH STENT PLACEMENT: SHX49

## 2010-09-13 LAB — CBC
HCT: 41.4 % (ref 36.0–46.0)
MCH: 30.2 pg (ref 26.0–34.0)
MCV: 88.1 fL (ref 78.0–100.0)
RDW: 14.7 % (ref 11.5–15.5)
WBC: 12.8 10*3/uL — ABNORMAL HIGH (ref 4.0–10.5)

## 2010-09-13 LAB — GLUCOSE, CAPILLARY
Glucose-Capillary: 150 mg/dL — ABNORMAL HIGH (ref 70–99)
Glucose-Capillary: 143 mg/dL — ABNORMAL HIGH (ref 70–99)
Glucose-Capillary: 99 mg/dL (ref 70–99)

## 2010-09-13 LAB — CARDIAC PANEL(CRET KIN+CKTOT+MB+TROPI)
CK, MB: 32.3 ng/mL (ref 0.3–4.0)
Troponin I: 8.93 ng/mL (ref <0.30)

## 2010-09-13 LAB — POCT ACTIVATED CLOTTING TIME: Activated Clotting Time: 517 seconds

## 2010-09-14 LAB — BASIC METABOLIC PANEL
BUN: 15 mg/dL (ref 6–23)
CO2: 22 mEq/L (ref 19–32)
Chloride: 111 mEq/L (ref 96–112)
Creatinine, Ser: 1.37 mg/dL — ABNORMAL HIGH (ref 0.4–1.2)

## 2010-09-14 LAB — CBC
HCT: 37.3 % (ref 36.0–46.0)
MCH: 29.6 pg (ref 26.0–34.0)
MCV: 87.8 fL (ref 78.0–100.0)
RBC: 4.25 MIL/uL (ref 3.87–5.11)
WBC: 12.6 10*3/uL — ABNORMAL HIGH (ref 4.0–10.5)

## 2010-09-14 LAB — GLUCOSE, CAPILLARY
Glucose-Capillary: 131 mg/dL — ABNORMAL HIGH (ref 70–99)
Glucose-Capillary: 153 mg/dL — ABNORMAL HIGH (ref 70–99)

## 2010-09-15 LAB — URINALYSIS, ROUTINE W REFLEX MICROSCOPIC
Bilirubin Urine: NEGATIVE
Ketones, ur: NEGATIVE mg/dL
Nitrite: NEGATIVE
Urobilinogen, UA: 0.2 mg/dL (ref 0.0–1.0)

## 2010-09-15 LAB — GLUCOSE, CAPILLARY
Glucose-Capillary: 76 mg/dL (ref 70–99)
Glucose-Capillary: 104 mg/dL — ABNORMAL HIGH (ref 70–99)

## 2010-09-15 LAB — BASIC METABOLIC PANEL
Calcium: 8.9 mg/dL (ref 8.4–10.5)
GFR calc Af Amer: 38 mL/min — ABNORMAL LOW (ref >60)
GFR calc non Af Amer: 31 mL/min — ABNORMAL LOW (ref >60)
Sodium: 143 mEq/L (ref 135–145)

## 2010-09-15 LAB — CBC
MCH: 30.4 pg (ref 26.0–34.0)
MCHC: 34.5 g/dL (ref 30.0–36.0)
Platelets: 152 10*3/uL (ref 150–400)
RBC: 4.5 MIL/uL (ref 3.87–5.11)
RDW: 14.9 % (ref 11.5–15.5)

## 2010-09-15 LAB — SODIUM, URINE, RANDOM: Sodium, Ur: 54 mEq/L

## 2010-09-15 LAB — CREATININE, URINE, RANDOM: Creatinine, Urine: 50.6 mg/dL

## 2010-09-16 LAB — BASIC METABOLIC PANEL
CO2: 24 mEq/L (ref 19–32)
Chloride: 107 mEq/L (ref 96–112)
Creatinine, Ser: 1.72 mg/dL — ABNORMAL HIGH (ref 0.4–1.2)
GFR calc Af Amer: 35 mL/min — ABNORMAL LOW (ref >60)
Potassium: 3.6 mEq/L (ref 3.5–5.1)

## 2010-09-16 LAB — OSMOLALITY, URINE: Osmolality, Ur: 217 mOsm/kg — ABNORMAL LOW (ref 390–1090)

## 2010-09-16 LAB — RENAL FUNCTION PANEL
Albumin: 2.3 g/dL — ABNORMAL LOW (ref 3.5–5.2)
BUN: 26 mg/dL — ABNORMAL HIGH (ref 6–23)
Calcium: 8.5 mg/dL (ref 8.4–10.5)
Phosphorus: 3.1 mg/dL (ref 2.3–4.6)
Potassium: 3.4 mEq/L — ABNORMAL LOW (ref 3.5–5.1)
Sodium: 142 mEq/L (ref 135–145)

## 2010-09-16 LAB — GLUCOSE, CAPILLARY
Glucose-Capillary: 194 mg/dL — ABNORMAL HIGH (ref 70–99)
Glucose-Capillary: 129 mg/dL — ABNORMAL HIGH (ref 70–99)
Glucose-Capillary: 128 mg/dL — ABNORMAL HIGH (ref 70–99)

## 2010-09-16 LAB — PHOSPHORUS: Phosphorus: 3.1 mg/dL (ref 2.3–4.6)

## 2010-09-16 LAB — CBC
HCT: 38.3 % (ref 36.0–46.0)
MCH: 30.1 pg (ref 26.0–34.0)
MCHC: 34.5 g/dL (ref 30.0–36.0)
RDW: 14.8 % (ref 11.5–15.5)

## 2010-09-16 LAB — PRO B NATRIURETIC PEPTIDE: Pro B Natriuretic peptide (BNP): 2148 pg/mL — ABNORMAL HIGH (ref 0–450)

## 2010-09-16 LAB — MAGNESIUM: Magnesium: 2 mg/dL (ref 1.5–2.5)

## 2010-09-16 NOTE — Cardiovascular Report (Signed)
NAMEKEYAH, BLIZARD NO.:  [id number]  MEDICAL RECORD NO.:  [id number]  LOCATION:  2924                         FACILITY:  [facility]  PHYSICIAN:  Nicki Guadalajara, M.D.     DATE OF BIRTH:  06/29/1928  DATE OF PROCEDURE:  09/13/2010 DATE OF DISCHARGE:                           CARDIAC CATHETERIZATION   INDICATIONS:  [name] is a 77 year old female who has established coronary artery disease.  She remotely has undergone several stent placements in her proximal and mid right coronary artery.  Her last catheterization was done in February 2012, which showed patent proximal and segmental mid RCA stents.  She did have an area of narrowing beyond the stented segment distally, which was not changed from the year previously and was smooth in contour, approximately 40- 50%.  The patient apparently was admitted to [facility] this past weekend with chest pain and unstable anginal symptomatology.  She does have Q waves noted in III and F with T-wave inversion in III. Subsequent troponin increased to 16, and she had positive CPK.  She has been pain-free on nitroglycerin and heparin.  She now presents for cardiac catheterization.  PROCEDURE:  After premedication with Versed 1 mg plus fentanyl 25 mcg intravenously, the patient prepped and draped in usual fashion.  The right femoral artery was punctured anteriorly and a 5-French sheath was inserted.  Diagnostic catheterization was done utilizing a 5-French Judkins for left and right coronary catheters.  A 5-French pigtail catheter was used for RAO ventriculography.  Distal aortography was also performed to further evaluate her distal aortic irregularity.  With a demonstration of now total occlusion of the RCA proximally in the proximal to mid segment beyond the proximal stent in an area perhaps is just proximal to the mid that started at the mid placed stent.  Decision was made to pursue intervention.  Sheath was  upgraded to a 6-French system.  The patient received an additional milligram of Versed plus fentanyl.  Angiomax was given.  The patient was loaded yesterday by Dr. Rennis Golden with Brilinta 180 mg in addition and was 90 b.i.d.  Initially, 6- [place] FR-4 with side holes was used.  Asahi medium wire was able to cross the totally occluded proximal segment, but was not able to get beyond the midsegment.  Consequently, a 2.0 x 12 mm [place] scientific balloon was then inserted to help cross the area.  Again, this was unsuccessful in crossing in the mid distal segment in the previously placed stented region.  The balloon and wire were then removed.  A Choice PT2 to moderate support was then inserted.  The wire was ultimately able to cross the total distal occlusion and was advanced beyond the PDA vessel.  However, the 2.0 balloon was then placed on this wire, but then this balloon was not able to cross the RCA in the region of the acute margin at the site of more distal occlusion.  Attempts at dottering were made unsuccessfully.  The balloon was then removed and a 1.5 x 12 mm apex push balloon was used.  Again, multiple attempts were made at crossing this.  Backup was suboptimal making it difficult. Ultimately, the entire system was removed.  The  new hockey-stick with side hole 6-French guide was used in attempt to allow more support.  The Choice wire was reinserted, and this did not cross by itself, but ultimately with the 1.5 apex push balloon, was again able to cross the distal lesion.  The apex balloon now was with improved backup was finally able to cross the distal occlusion.  Multiple dilatations were made with this balloon distally beyond the most distally placed stent and also within the previously placed stented segments.  There was diffuse disease within the entire mid stented segment and high-grade diffuse disease distally.  The 2.5 x 20 mm noncompliant Quantum balloon was then inserted.   The noncompliant Quantum balloon was used to dilate the entire proximal area as well as an area between the proximal mid stent as well as within the stented segment.  Low-level inflations were made distally beyond the stented segment to dilate this diffusely diseased segment.  The noncompliant balloon was then removed.  A 2.25 x 24 mm Promus Element DES stent was then inserted, and this was able to be advanced distally with stent overlap to the distal aspect of the old mid distal RCA stent in the region of the crux.  This stent was able to cover the entire diffusely diseased segment.  This was dilated x2 up to 14 atmospheres.  The 2.5 x 20 mm noncompliant balloon was then used for poststent dilatation.  Again, a significant waist was still present at the site where there had been total occlusion distally.  Dilatation was done up to 13 and 14 atmospheres.  This was also used to dilate the midsegment and also the ear between the proximal and mid stent.  A second 2.75 x 24 mm Promus Element stent was then placed in the proximal- to-mid segment with stent overlap to the proximal stent and to the distal aspect of the proximal stent and the proximal-to-mid aspect of the mid RCA stent where there also was still residual narrowing.  This was dilated x3 up to 13 atmospheres.  The balloon was then used to further dilate the entire previously placed stented segment.  A 2.75 x 12 mm noncompliant Quantum apex balloon was then used for poststent dilatation throughout the entire stented segment.  Again, there was also very focal dilatation done to open up the waist distally.  Ultimately, this did significantly open with dilatation up to 16 atmospheres corresponding to approximately 2.8 mm.  Following dilatation within the entire stented segment, the RCA now had brisk TIMI 3 flow.  The site of initial 100% occlusion was reduced to 0%.  The diffusely diseased 99-95% stenosis in the stented segment once it  was initially opened proximally was opened to approximately 0%.  The distal area was opened from 100% with residual narrowing of 10-20% where the waist had been significant. There was brisk TIMI 3 flow and no evidence for dissection.  Arterial sheath was sutured in place with plans for sheath removal later today. The Angiomax was continued but low dose until the bag it runs out.  HEMODYNAMIC DATA:  Central aortic pressure was 133/60.  Left ventricular pressure was 133/15.  ANGIOGRAPHIC DATA:  Left main coronary artery was short, normal vessel, which bifurcated into the LAD and left circumflex system.  The LAD was angiographically normal, gave rise to several septal perforating arteries and diagonal vessels as it was free of significant disease.  There was faint distal collateralization of the RCA via the left system.  The circumflex vessel gave rise  two major marginal vessels and was angiographically normal.  The right coronary artery had a proximally placed stent.  There then appeared to be an area of 60-70% narrowing followed by 100% occlusion in the region of an anterior RV marginal branch.  The mid RCA stent was visible, but not perfused.  Once balloon dilatation was able to open up the proximal occlusion, there was diffuse disease within the stented segment and then complete total occlusion distally.  The distal occlusion was very difficult to open and cross even with a 1.5 mm balloon.  Ultimately, the RCA was a large-caliber vessel.  A new 2.75 x 24 mm stent was placed in between the proximal and mid RCA stents and dilated to approximately 2.85 mm with 100% occlusion being reduced to 0%.  The diffuse mid RCA stented intimal area of narrowing was opened to 0%.  The distal 100% occlusion was opened to 10 to less than 20% and once it had been opened, had been diffusely diseased and had placement of the new 2.25 x 24 mm Promus Element stent with focal dilatation in the waist  segment up to close to 2.8 mm.  There was resumption of brisk TIMI 3 flow and no evidence for dissection.  Left ventriculography revealed preserved global LV contractility with some mild mid-to-basal inferior hypocontractility.  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  IMPRESSION: 1. Preserved left ventricular function with mid to basal inferior     hypocontractility. 2. Total occlusion of the right coronary artery in this patient with     previous areas of multiple stenting. 3. Normal left anterior descending and left circumflex system with     initial faint left-to-right collaterals. 4. Difficult but successful PTCA/stenting of the right coronary artery     with ultimate insertion of two additional     stents 2.75 x 24 mm stent placed proximally at the site of initial     occlusion, and another 2.25 x 24 mm stent placed at the distal site     of occlusion with poststent dilatation up to approximately 2.8 mm. 5. Bivalirudin/IC nitroglycerin/Brilinta/aspirin.          ______________________________ Nicki Guadalajara, M.D.     TK/MEDQ  D:  09/13/2010  T:  09/14/2010  Job:  [id number]  Electronically Signed by Nicki Guadalajara M.D. on 09/16/2010 04:15:42 PM

## 2010-09-17 LAB — BASIC METABOLIC PANEL
BUN: 27 mg/dL — ABNORMAL HIGH (ref 6–23)
Calcium: 8.7 mg/dL (ref 8.4–10.5)
Chloride: 110 mEq/L (ref 96–112)
Creatinine, Ser: 1.61 mg/dL — ABNORMAL HIGH (ref 0.50–1.10)
GFR calc Af Amer: 37 mL/min — ABNORMAL LOW (ref >60)

## 2010-09-17 LAB — CBC
MCH: 29.8 pg (ref 26.0–34.0)
MCHC: 34.7 g/dL (ref 30.0–36.0)
MCV: 86.1 fL (ref 78.0–100.0)
Platelets: 186 10*3/uL (ref 150–400)
RBC: 4.39 MIL/uL (ref 3.87–5.11)

## 2010-09-17 LAB — GLUCOSE, CAPILLARY: Glucose-Capillary: 162 mg/dL — ABNORMAL HIGH (ref 70–99)

## 2010-09-17 LAB — PRO B NATRIURETIC PEPTIDE: Pro B Natriuretic peptide (BNP): 1520 pg/mL — ABNORMAL HIGH (ref 0–450)

## 2010-09-18 DIAGNOSIS — N179 Acute kidney failure, unspecified: Secondary | ICD-10-CM

## 2010-09-18 DIAGNOSIS — R5381 Other malaise: Secondary | ICD-10-CM

## 2010-09-18 DIAGNOSIS — I2510 Atherosclerotic heart disease of native coronary artery without angina pectoris: Secondary | ICD-10-CM

## 2010-09-18 LAB — CBC
HCT: 39.1 % (ref 36.0–46.0)
Hemoglobin: 13.5 g/dL (ref 12.0–15.0)
MCH: 29.9 pg (ref 26.0–34.0)
RBC: 4.51 MIL/uL (ref 3.87–5.11)

## 2010-09-18 LAB — BASIC METABOLIC PANEL
CO2: 24 mEq/L (ref 19–32)
Chloride: 109 mEq/L (ref 96–112)
Glucose, Bld: 90 mg/dL (ref 70–99)
Sodium: 143 mEq/L (ref 135–145)

## 2010-09-18 LAB — GLUCOSE, CAPILLARY
Glucose-Capillary: 267 mg/dL — ABNORMAL HIGH (ref 70–99)
Glucose-Capillary: 94 mg/dL (ref 70–99)
Glucose-Capillary: 118 mg/dL — ABNORMAL HIGH (ref 70–99)
Glucose-Capillary: 161 mg/dL — ABNORMAL HIGH (ref 70–99)

## 2010-09-18 LAB — PRO B NATRIURETIC PEPTIDE: Pro B Natriuretic peptide (BNP): 1117 pg/mL — ABNORMAL HIGH (ref 0–450)

## 2010-09-19 LAB — CBC
MCH: 30.4 pg (ref 26.0–34.0)
MCHC: 34.9 g/dL (ref 30.0–36.0)
Platelets: 237 10*3/uL (ref 150–400)
RDW: 14.5 % (ref 11.5–15.5)

## 2010-09-19 LAB — BASIC METABOLIC PANEL
CO2: 27 mEq/L (ref 19–32)
Calcium: 9.2 mg/dL (ref 8.4–10.5)
Creatinine, Ser: 1.84 mg/dL — ABNORMAL HIGH (ref 0.50–1.10)
GFR calc Af Amer: 32 mL/min — ABNORMAL LOW (ref >60)
GFR calc non Af Amer: 26 mL/min — ABNORMAL LOW (ref >60)

## 2010-09-19 LAB — GLUCOSE, CAPILLARY
Glucose-Capillary: 98 mg/dL (ref 70–99)
Glucose-Capillary: 95 mg/dL (ref 70–99)
Glucose-Capillary: 159 mg/dL — ABNORMAL HIGH (ref 70–99)

## 2010-09-20 LAB — BASIC METABOLIC PANEL
BUN: 29 mg/dL — ABNORMAL HIGH (ref 6–23)
Chloride: 106 mEq/L (ref 96–112)
GFR calc Af Amer: 31 mL/min — ABNORMAL LOW (ref >60)
Potassium: 3.8 mEq/L (ref 3.5–5.1)

## 2010-09-20 LAB — CBC
HCT: 38.2 % (ref 36.0–46.0)
RDW: 14.2 % (ref 11.5–15.5)
WBC: 8.8 10*3/uL (ref 4.0–10.5)

## 2010-09-20 LAB — GLUCOSE, CAPILLARY
Glucose-Capillary: 101 mg/dL — ABNORMAL HIGH (ref 70–99)
Glucose-Capillary: 100 mg/dL — ABNORMAL HIGH (ref 70–99)
Glucose-Capillary: 103 mg/dL — ABNORMAL HIGH (ref 70–99)

## 2010-09-21 HISTORY — PX: INSERT / REPLACE / REMOVE PACEMAKER: SUR710

## 2010-09-21 LAB — PROTIME-INR: Prothrombin Time: 14.9 seconds (ref 11.6–15.2)

## 2010-09-21 LAB — BASIC METABOLIC PANEL
CO2: 26 mEq/L (ref 19–32)
Chloride: 105 mEq/L (ref 96–112)
Creatinine, Ser: 1.87 mg/dL — ABNORMAL HIGH (ref 0.50–1.10)
GFR calc Af Amer: 31 mL/min — ABNORMAL LOW (ref >60)
Potassium: 3.4 mEq/L — ABNORMAL LOW (ref 3.5–5.1)
Sodium: 142 mEq/L (ref 135–145)

## 2010-09-21 LAB — GLUCOSE, CAPILLARY
Glucose-Capillary: 85 mg/dL (ref 70–99)
Glucose-Capillary: 62 mg/dL — ABNORMAL LOW (ref 70–99)
Glucose-Capillary: 64 mg/dL — ABNORMAL LOW (ref 70–99)
Glucose-Capillary: 127 mg/dL — ABNORMAL HIGH (ref 70–99)

## 2010-09-21 LAB — CBC
HCT: 39.2 % (ref 36.0–46.0)
Hemoglobin: 13.5 g/dL (ref 12.0–15.0)
MCH: 29.7 pg (ref 26.0–34.0)
MCHC: 34.4 g/dL (ref 30.0–36.0)
MCV: 86.3 fL (ref 78.0–100.0)
RBC: 4.54 MIL/uL (ref 3.87–5.11)

## 2010-09-21 LAB — PRO B NATRIURETIC PEPTIDE: Pro B Natriuretic peptide (BNP): 456.4 pg/mL — ABNORMAL HIGH (ref 0–450)

## 2010-09-22 ENCOUNTER — Inpatient Hospital Stay (HOSPITAL_COMMUNITY): Payer: Medicare Other

## 2010-09-22 LAB — CBC
Hemoglobin: 14.2 g/dL (ref 12.0–15.0)
MCH: 29.9 pg (ref 26.0–34.0)
MCHC: 34.4 g/dL (ref 30.0–36.0)
RDW: 13.8 % (ref 11.5–15.5)

## 2010-09-22 LAB — URINALYSIS, ROUTINE W REFLEX MICROSCOPIC
Bilirubin Urine: NEGATIVE
Ketones, ur: NEGATIVE mg/dL
Nitrite: NEGATIVE
Protein, ur: NEGATIVE mg/dL
Urobilinogen, UA: 0.2 mg/dL (ref 0.0–1.0)

## 2010-09-22 LAB — COMPREHENSIVE METABOLIC PANEL
Alkaline Phosphatase: 112 U/L (ref 39–117)
BUN: 29 mg/dL — ABNORMAL HIGH (ref 6–23)
Chloride: 100 mEq/L (ref 96–112)
Creatinine, Ser: 1.7 mg/dL — ABNORMAL HIGH (ref 0.50–1.10)
GFR calc Af Amer: 35 mL/min — ABNORMAL LOW (ref >60)
Glucose, Bld: 191 mg/dL — ABNORMAL HIGH (ref 70–99)
Potassium: 4.3 mEq/L (ref 3.5–5.1)
Total Bilirubin: 0.7 mg/dL (ref 0.3–1.2)

## 2010-09-22 LAB — GLUCOSE, CAPILLARY
Glucose-Capillary: 86 mg/dL (ref 70–99)
Glucose-Capillary: 163 mg/dL — ABNORMAL HIGH (ref 70–99)

## 2010-09-23 LAB — BASIC METABOLIC PANEL
BUN: 30 mg/dL — ABNORMAL HIGH (ref 6–23)
Chloride: 102 mEq/L (ref 96–112)
Creatinine, Ser: 1.7 mg/dL — ABNORMAL HIGH (ref 0.50–1.10)
GFR calc Af Amer: 35 mL/min — ABNORMAL LOW (ref >60)
GFR calc non Af Amer: 29 mL/min — ABNORMAL LOW (ref >60)

## 2010-09-23 LAB — URINE CULTURE
Colony Count: NO GROWTH
Culture  Setup Time: 201206201837
Culture: NO GROWTH

## 2010-09-23 LAB — CBC
HCT: 38.9 % (ref 36.0–46.0)
MCH: 29.9 pg (ref 26.0–34.0)
MCHC: 34.4 g/dL (ref 30.0–36.0)
MCV: 86.8 fL (ref 78.0–100.0)
RDW: 13.8 % (ref 11.5–15.5)

## 2010-09-23 LAB — GLUCOSE, CAPILLARY: Glucose-Capillary: 158 mg/dL — ABNORMAL HIGH (ref 70–99)

## 2010-09-24 LAB — CBC
HCT: 39.1 % (ref 36.0–46.0)
Hemoglobin: 13.5 g/dL (ref 12.0–15.0)
MCH: 30.3 pg (ref 26.0–34.0)
MCHC: 34.5 g/dL (ref 30.0–36.0)
MCV: 87.7 fL (ref 78.0–100.0)

## 2010-09-27 LAB — GLUCOSE, CAPILLARY

## 2010-09-30 NOTE — Op Note (Signed)
NAMEPIEPER, KASIK           ACCOUNT NO.:  [id number]  MEDICAL RECORD NO.:  [id number]  LOCATION:  2021                         FACILITY:  [facility]  PHYSICIAN:  Thurmon Fair, MD     DATE OF BIRTH:  1928/08/18  DATE OF PROCEDURE: DATE OF DISCHARGE:                              OPERATIVE REPORT   PROCEDURE PERFORMED: 1. Implantation of new dual-chamber permanent pacemaker. 2. Fluoroscopy. 3. Moderate sedation.  PREOPERATIVE DIAGNOSES: 1. Paroxysmal second-degree atrioventricular block, Mobitz type II. 2. Symptomatic bradycardia due to necessary medications.  POSTOPERATIVE DIAGNOSES: 1. Paroxysmal second-degree atrioventricular block, Mobitz type II. 2. Symptomatic bradycardia due to necessary medications. 3. Successful implantation of dual-chamber permanent pacemaker.  PROCEDURE PERFORMED:  By Thurmon Fair, MD  ASSISTANT:  Virgie Dad.  MEDICATIONS ADMINISTERED:  Ancef 1 g intravenously, lidocaine 1% 25 mL locally, Versed 3 mg, and fentanyl 50 mcg intravenously.  COMPLICATIONS:  None.  ESTIMATED BLOOD LOSS:  Less than 10 mL.  Ms. Ulrey is an 77 year old woman with known coronary artery disease status post recent percutaneous revascularization of the right coronary artery.  Several days following the revascularizations procedure, she was noted to have recurrent episodes of high-grade second-degree atrioventricular block with pauses of up to 6.7 seconds.  Pauses were noted even after interruption with beta-blocker therapy.  After risks and benefits of the procedure were described, the patient provided informed consent and was brought to the cardiac cath lab in a fasting state.  She was prepped and draped in usual sterile fashion. Local anesthesia with 1% lidocaine was administered to the left prepectoral area.  A 6-cm horizontal incision was made parallel with the lower border of the inferior clavicle roughly 3 cm caudal to it.  Using electrocautery and  blunt dissection, a prepectoral pocket was carefully created down to the level of the pectoralis muscle fascia. Great care was taken to provide excellent hemostasis since the patient is on dual antiplatelet therapy that cannot be interrupted.  Once adequate hemostasis had been obtained, an antibiotic soaked sponge was placed in the pocket.  Under fluoroscopic guidance and using the modified Seldinger technique and two separate venipunctures two J-tipped guidewires were placed in left subclavian vein and subsequently changed for 7-French SafeSheath.  With fluoroscopic guidance, the right ventricular lead was advanced to the level of the right ventricular mid to apical septum.  The active fixation helix was deployed.  Prominent current of injury was seen. There was satisfactory sensing and pacing threshold.  Pacing at maximum device output did not produce diaphragmatic/phrenic nerve stimulation. The SafeSheath was peeled away and the lead was secured in place using 2- 0 silk.  In a similar fashion, the atrial lead was advanced to the level of the right atrial appendage and the active fixation helix was deployed. There was very prominent current of injury.  Pacing at maximum device output did not produce diaphragmatic/phrenic nerve stimulation. Satisfactory sensing and good threshold was noted.  The SafeSheath was peeled away and the lead was secured in place using 2-0 silk.  The antibiotic soaked sponge was then removed from the pocket and the pocket was flushed with copious amounts of antibiotic solution and reinspected for hemostasis.  This was found to be excellent.  The generator was then attached to the ventricular and subsequently the atrial lead with appropriate ventricular pacing and subsequently atrial sensed ventricular paced rhythm being noted.  The generator was placed in the pocket with great care being taken that the leads be located deep to the generator.  A single  loose silk suture was placed through the generator to attach it to the posterior wall of the pocket.  The pocket was then closed in layers using two layers of 2-0 Vicryl and one layer of cutaneous staples.  A sterile dressing was then applied.  DEVICE DETAILS:  The generator is a Medtronic Adapta model number ADDDRL1, serial number [id number].  The atrial lead is a Medtronic 5076 - 45 cm, serial number [id number].  Right ventricular lead is a Medtronic 5076 - 52 cm, serial number [id number].  At the end of the procedure, the following electronic parameters were recorded.  Atrial lead sensed P-waves 2.6 mV, impedance 998 ohms, threshold 0.75 volts at 0.5 milliseconds pulse width.  Right ventricular lead parameters were sensed R-waves 11.4 mV, impedance 859 ohms, threshold 0.9 volts at 0.5 milliseconds pulse width.     Thurmon Fair, MD     MC/MEDQ  D:  09/21/2010  T:  09/22/2010  Job:  [id number]  cc:   [facility]  Electronically Signed by Thurmon Fair M.D. on 09/30/2010 04:27:09 PM

## 2010-10-27 NOTE — Discharge Summary (Signed)
NAMEHARLOWE, [name]           ACCOUNT NO.:  [id number]  MEDICAL RECORD NO.:  [id number]  LOCATION:  2021                         FACILITY:  [facility]  PHYSICIAN:  Nanetta Batty, M.D.   DATE OF BIRTH:  04/18/1928  DATE OF ADMISSION:  09/11/2010 DATE OF DISCHARGE:  09/24/2010                              DISCHARGE SUMMARY   DISCHARGE DIAGNOSES: 1. Non-ST elevation myocardial infarction. 2. Coronary disease status post right coronary artery drug-eluting     stent placement this admission, September 13, 2010. 3. Transient renal insufficiency after catheterization. 4. Stage III chronic renal insufficiency with a creatinine of 1.7. 5. Sinus bradycardia and sinus pauses, status post Medtronic pacemaker     implant this admission on September 21, 2010.  HOSPITAL COURSE:  Ms. Jaeger is a pleasant 77 year old female, who is followed by Dr. Allyson Sabal.  She also sees Dr. Merlene Laughter.  She was admitted on September 11, 2010, with chest pain, worrisome for unstable angina.  She was admitted to telemetry and enzymes were obtained.  Her CKs were positive with a CK of 925 and 103 MBs and troponin of 13.  She remained stable on IV heparin and nitroglycerin and was set up for diagnostic catheterization.  She did had a creatinine of 1.07 preop.  On September 13, 2010, she underwent catheterization and subsequent PROMUS stenting to the RCA.  This was by Dr. Tresa Endo.  Left main LAD, circumflex and OMs were patent.  Her EF was 55%.  Renal arteries and iliacs were normal, she did have some fusiform aneurysmal dilatation and the aorta. She continued to have chest pain in the lab and she was reevaluated that same day and ended up with stents between the first 2 stents that was placed and the stent after the first 2 stents.  Postoperatively, she did bump her creatinine.  She was hypotensive and we felt she had some degree of an RV infarct and was hydrated.  Echocardiogram was obtained, which showed an EF of 45% to 50%.  PA  pressures were 38 mm.  The patient did remain in the ICU during this.  She continued to be significantly weak.  She had short runs of type 2 second-degree AV block.  At one point, she had a pause of 6 seconds.  Her beta-blocker was discontinued. It was decided to proceed with pacemaker implant.  This was done on September 21, 2010, with a Medtronic device by Dr. Harrietta Guardian.  She tolerated this well.  Her pacer was checked prior to discharge and is functioning normally, there were some concerns on telemetry.  She may have a dropped QRS.  The patient had some trouble voiding.  She had a small amount of residual of 200 mL on a bladder scan.  Initially when she was admitted, she was treated for an E. Coli UTI.  Followup culture was negative.  We added Detrol LA at discharge.  The patient will be discharged to nursing home for further rehab.  DISCHARGE MEDICATIONS:  Please see med rec for complete discharge medications.  LABORATORY DATA:  Labs at discharge, white count 8.9, hemoglobin 13.5, hematocrit 39.1, and platelets 281.  Sodium 141, potassium 4.0, BUN 30, and creatinine 1.7.  Urinalysis is  unremarkable on September 22, 2010, and culture is negative on the September 22, 2010.  X-ray shows no acute process post pacer.  Telemetry shows sinus rhythm, currently overriding her pacer.  DISPOSITION:  The patient is discharged in stable condition.  She will need her staples removed from her pacer site early next week and will arrange for her to come to the office for this.     Abelino Derrick, P.A.   ______________________________ Nanetta Batty, M.D.    Lenard Lance  D:  09/24/2010  T:  09/24/2010  Job:  [id number]  cc:   Dr. Ebbie Ridge T. Stoneking, M.D.  Electronically Signed by Corine Shelter P.A. on 10/07/2010 11:58:32 AM Electronically Signed by Nanetta Batty M.D. on 10/27/2010 03:13:03 PM

## 2011-01-04 LAB — BASIC METABOLIC PANEL
BUN: 24 — ABNORMAL HIGH
Chloride: 110
Glucose, Bld: 215 — ABNORMAL HIGH
Potassium: 4.8

## 2011-01-04 LAB — GLUCOSE, CAPILLARY
Glucose-Capillary: 106 — ABNORMAL HIGH
Glucose-Capillary: 86
Glucose-Capillary: 93

## 2011-01-20 LAB — BASIC METABOLIC PANEL
BUN: 35 — ABNORMAL HIGH
CO2: 26
Chloride: 104
Creatinine, Ser: 1.46 — ABNORMAL HIGH

## 2011-01-20 LAB — DIFFERENTIAL
Basophils Absolute: 0.2 — ABNORMAL HIGH
Basophils Relative: 2 — ABNORMAL HIGH
Eosinophils Absolute: 0.3
Lymphs Abs: 2.8
Neutrophils Relative %: 57

## 2011-01-20 LAB — POCT I-STAT CREATININE
Creatinine, Ser: 1.5 — ABNORMAL HIGH
Operator id: 282201

## 2011-01-20 LAB — I-STAT 8, (EC8 V) (CONVERTED LAB)
Acid-base deficit: 1
Bicarbonate: 26.5 — ABNORMAL HIGH
Potassium: 5.8 — ABNORMAL HIGH
TCO2: 28
pCO2, Ven: 51.1 — ABNORMAL HIGH
pH, Ven: 7.322 — ABNORMAL HIGH

## 2011-01-20 LAB — CBC
MCV: 89.4
Platelets: 212
WBC: 9.6

## 2011-03-22 ENCOUNTER — Other Ambulatory Visit: Payer: Self-pay

## 2011-03-22 ENCOUNTER — Emergency Department (HOSPITAL_COMMUNITY): Payer: Medicare Other

## 2011-03-22 ENCOUNTER — Encounter: Payer: Self-pay | Admitting: Emergency Medicine

## 2011-03-22 ENCOUNTER — Inpatient Hospital Stay (HOSPITAL_COMMUNITY)
Admission: EM | Admit: 2011-03-22 | Discharge: 2011-03-25 | DRG: 313 | Disposition: A | Discharge status: Home Health | Payer: Medicare Other | Source: Ambulatory Visit | Attending: Internal Medicine | Admitting: Internal Medicine

## 2011-03-22 DIAGNOSIS — I252 Old myocardial infarction: Secondary | ICD-10-CM

## 2011-03-22 DIAGNOSIS — E876 Hypokalemia: Secondary | ICD-10-CM | POA: Diagnosis present

## 2011-03-22 DIAGNOSIS — K921 Melena: Secondary | ICD-10-CM | POA: Diagnosis present

## 2011-03-22 DIAGNOSIS — Z8673 Personal history of transient ischemic attack (TIA), and cerebral infarction without residual deficits: Secondary | ICD-10-CM

## 2011-03-22 DIAGNOSIS — I2510 Atherosclerotic heart disease of native coronary artery without angina pectoris: Secondary | ICD-10-CM | POA: Diagnosis present

## 2011-03-22 DIAGNOSIS — I10 Essential (primary) hypertension: Secondary | ICD-10-CM | POA: Diagnosis present

## 2011-03-22 DIAGNOSIS — E039 Hypothyroidism, unspecified: Secondary | ICD-10-CM | POA: Diagnosis present

## 2011-03-22 DIAGNOSIS — Z9861 Coronary angioplasty status: Secondary | ICD-10-CM

## 2011-03-22 DIAGNOSIS — I509 Heart failure, unspecified: Secondary | ICD-10-CM | POA: Diagnosis present

## 2011-03-22 DIAGNOSIS — G459 Transient cerebral ischemic attack, unspecified: Secondary | ICD-10-CM | POA: Insufficient documentation

## 2011-03-22 DIAGNOSIS — I5032 Chronic diastolic (congestive) heart failure: Secondary | ICD-10-CM | POA: Diagnosis not present

## 2011-03-22 DIAGNOSIS — N183 Chronic kidney disease, stage 3 unspecified: Secondary | ICD-10-CM | POA: Diagnosis present

## 2011-03-22 DIAGNOSIS — E785 Hyperlipidemia, unspecified: Secondary | ICD-10-CM | POA: Diagnosis present

## 2011-03-22 DIAGNOSIS — N179 Acute kidney failure, unspecified: Secondary | ICD-10-CM | POA: Diagnosis present

## 2011-03-22 DIAGNOSIS — I129 Hypertensive chronic kidney disease with stage 1 through stage 4 chronic kidney disease, or unspecified chronic kidney disease: Secondary | ICD-10-CM | POA: Diagnosis present

## 2011-03-22 DIAGNOSIS — R079 Chest pain, unspecified: Secondary | ICD-10-CM | POA: Diagnosis present

## 2011-03-22 DIAGNOSIS — Z950 Presence of cardiac pacemaker: Secondary | ICD-10-CM

## 2011-03-22 DIAGNOSIS — E1169 Type 2 diabetes mellitus with other specified complication: Secondary | ICD-10-CM | POA: Diagnosis present

## 2011-03-22 DIAGNOSIS — R0789 Other chest pain: Principal | ICD-10-CM | POA: Diagnosis present

## 2011-03-22 DIAGNOSIS — D72829 Elevated white blood cell count, unspecified: Secondary | ICD-10-CM | POA: Diagnosis present

## 2011-03-22 HISTORY — DX: Type 2 diabetes mellitus without complications: E11.9

## 2011-03-22 HISTORY — DX: Hyperlipidemia, unspecified: E78.5

## 2011-03-22 HISTORY — DX: Essential (primary) hypertension: I10

## 2011-03-22 HISTORY — DX: Atherosclerotic heart disease of native coronary artery without angina pectoris: I25.10

## 2011-03-22 HISTORY — DX: Presence of cardiac pacemaker: Z95.0

## 2011-03-22 HISTORY — DX: Reserved for inherently not codable concepts without codable children: IMO0001

## 2011-03-22 HISTORY — DX: Long term (current) use of insulin: Z79.4

## 2011-03-22 HISTORY — DX: Heart failure, unspecified: I50.9

## 2011-03-22 HISTORY — DX: Hypothyroidism, unspecified: E03.9

## 2011-03-22 LAB — CBC
HCT: 42 % (ref 36.0–46.0)
MCHC: 33.8 g/dL (ref 30.0–36.0)
MCV: 90.3 fL (ref 78.0–100.0)
RDW: 14.4 % (ref 11.5–15.5)

## 2011-03-22 LAB — BASIC METABOLIC PANEL
BUN: 59 mg/dL — ABNORMAL HIGH (ref 6–23)
Calcium: 10 mg/dL (ref 8.4–10.5)
GFR calc non Af Amer: 26 mL/min — ABNORMAL LOW (ref >90)
Glucose, Bld: 47 mg/dL — ABNORMAL LOW (ref 70–99)

## 2011-03-22 LAB — GLUCOSE, CAPILLARY: Glucose-Capillary: 368 mg/dL — ABNORMAL HIGH (ref 70–99)

## 2011-03-22 MED ORDER — ASPIRIN EC 81 MG PO TBEC
81.0000 mg | DELAYED_RELEASE_TABLET | Freq: Every day | ORAL | Status: DC
  Filled 2011-03-22: qty 1

## 2011-03-22 MED ORDER — HEPARIN SOD (PORCINE) IN D5W 100 UNIT/ML IV SOLN
850.0000 [IU]/h | INTRAVENOUS | Status: DC
  Administered 2011-03-22 – 2011-03-24 (×2): 850 [IU]/h via INTRAVENOUS
  Filled 2011-03-22 (×4): qty 250

## 2011-03-22 MED ORDER — ACETAMINOPHEN 325 MG PO TABS
650.0000 mg | ORAL_TABLET | ORAL | Status: DC | PRN
  Administered 2011-03-23: 650 mg via ORAL
  Filled 2011-03-22: qty 2

## 2011-03-22 MED ORDER — SODIUM CHLORIDE 0.9 % IJ SOLN: 3.0000 mL | INTRAMUSCULAR | Status: DC | PRN

## 2011-03-22 MED ORDER — SODIUM CHLORIDE 0.9 % IV SOLN
250.0000 mL | INTRAVENOUS | Status: DC | PRN
  Administered 2011-03-22: 250 mL via INTRAVENOUS

## 2011-03-22 MED ORDER — ASPIRIN 300 MG RE SUPP: 300.0000 mg | RECTAL | Status: AC

## 2011-03-22 MED ORDER — NITROGLYCERIN 0.4 MG SL SUBL: 0.4000 mg | SUBLINGUAL_TABLET | SUBLINGUAL | Status: DC | PRN

## 2011-03-22 MED ORDER — DEXTROSE 50 % IV SOLN
25.0000 g | Freq: Once | INTRAVENOUS | Status: AC
  Administered 2011-03-22: 25 g via INTRAVENOUS
  Filled 2011-03-22: qty 50

## 2011-03-22 MED ORDER — NITROGLYCERIN 2 % TD OINT
1.0000 [in_us] | TOPICAL_OINTMENT | Freq: Four times a day (QID) | TRANSDERMAL | Status: DC
  Administered 2011-03-22 – 2011-03-25 (×7): 1 [in_us] via TOPICAL
  Filled 2011-03-22: qty 1

## 2011-03-22 MED ORDER — HEPARIN BOLUS VIA INFUSION
3500.0000 [IU] | Freq: Once | INTRAVENOUS | Status: AC
  Administered 2011-03-22: 3500 [IU] via INTRAVENOUS
  Filled 2011-03-22: qty 3500

## 2011-03-22 MED ORDER — INSULIN ASPART 100 UNIT/ML ~~LOC~~ SOLN
0.0000 [IU] | SUBCUTANEOUS | Status: DC
  Administered 2011-03-23: 2 [IU] via SUBCUTANEOUS
  Administered 2011-03-23: 3 [IU] via SUBCUTANEOUS
  Administered 2011-03-23 – 2011-03-24 (×4): 2 [IU] via SUBCUTANEOUS
  Administered 2011-03-24 – 2011-03-25 (×3): 3 [IU] via SUBCUTANEOUS
  Filled 2011-03-22: qty 3

## 2011-03-22 MED ORDER — SODIUM CHLORIDE 0.9 % IV SOLN: INTRAVENOUS | Status: DC

## 2011-03-22 MED ORDER — SODIUM CHLORIDE 0.9 % IJ SOLN
3.0000 mL | Freq: Two times a day (BID) | INTRAMUSCULAR | Status: DC
  Administered 2011-03-22: 3 mL via INTRAVENOUS

## 2011-03-22 MED ORDER — ONDANSETRON HCL 4 MG/2ML IJ SOLN: 4.0000 mg | Freq: Four times a day (QID) | INTRAMUSCULAR | Status: DC | PRN

## 2011-03-22 MED ORDER — ASPIRIN 81 MG PO CHEW
324.0000 mg | CHEWABLE_TABLET | ORAL | Status: AC
  Administered 2011-03-23: 324 mg via ORAL
  Filled 2011-03-22: qty 4

## 2011-03-22 NOTE — ED Notes (Signed)
2005-01 ready

## 2011-03-22 NOTE — ED Notes (Signed)
Portable scale unavailable. Pt stated weight: 170lbs height:5 ft 3 in

## 2011-03-22 NOTE — Progress Notes (Signed)
ANTICOAGULATION CONSULT NOTE - Initial Consult  Pharmacy Consult for Heparin Indication: unstable angina  No Known Allergies  Patient Measurements: Height: 5\' 3"  (160 cm) Weight: 170 lb (77.111 kg) IBW/kg (Calculated) : 52.4  Heparin Dosing Weight: 69 kg  Vital Signs: Temp: 97.6 F (36.4 C) (12/18 1425) Temp src: Oral (12/18 1425) BP: 103/48 mmHg (12/18 1916) Pulse Rate: 77  (12/18 1916)  Labs:  Basename 03/22/11 1554  HGB 14.2  HCT 42.0  PLT 189  APTT --  LABPROT --  INR --  HEPARINUNFRC --  CREATININE 1.77*  CKTOTAL --  CKMB --  TROPONINI --   Estimated Creatinine Clearance: 24.9 ml/min (by C-G formula based on Cr of 1.77).  Medical History: Past Medical History  Diagnosis Date  . CHF (congestive heart failure)   . Coronary artery disease   . Hypertension   . Pacemaker   . Hyperlipidemia   . TIA (transient ischemic attack)   . Hypothyroidism   . Insulin dependent diabetes mellitus     Medications:  Scheduled:    . dextrose  25 g Intravenous Once  . heparin  3,500 Units Intravenous Once  . insulin aspart  0-15 Units Subcutaneous Q4H  . nitroGLYCERIN  1 inch Topical Q6H  . DISCONTD: sodium chloride  3 mL Intravenous Q12H   Infusions:    . sodium chloride    . heparin      Assessment: 77 yo F to begin heparin for unstable angina. Patient has a significant history of CAD with stents to the RCA. H/H/plt wnl at baseline although SCr elevated.  Goal of Therapy:  Heparin level 0.3-0.7 units/ml   Plan:  1. Heparin 3500 units IV bolus then 850 units/hr (8.5 ml/hr) 2. Heparin level 8 hrs after started 3. Daily heparin level and CBC  [place], [employer] Danielle 03/22/2011,7:38 PM

## 2011-03-22 NOTE — ED Provider Notes (Signed)
Medical screening examination/treatment/procedure(s) were conducted as a shared visit with resident practitioner(s) and myself.  I personally evaluated the patient during the encounter  History of coronary disease with recent stent placement. Substernal and left-sided chest pain radiating to her jaw with associated nausea and dyspnea. Resolved after nitroglycerin. Started approximately 3 hours prior to arrival.  Regular rate and rhythm. No murmurs, rubs, gallops Lungs clear to auscultation bilaterally. Abdomen soft, nontender, nondistended No lower ext edema  Aspirin, cardiac workup, we'll consult her cardiologist for likely admission for rule out.  Dayton Bailiff, MD 03/22/11 [phone]

## 2011-03-22 NOTE — ED Provider Notes (Signed)
History     CSN: [id number] Arrival date & time: 03/22/2011  2:19 PM   First MD Initiated Contact with Patient 03/22/11 1505      Chief Complaint  Patient presents with  . Chest Pain    (Consider location/radiation/quality/duration/timing/severity/associated sxs/prior treatment) The history is provided by the patient.   Patient is an 77 year old female with a history of CAD (DES to RCA in June 2012) who presents with chest pain. This chest pain started about 3 hours prior to arrival. It occurred while she was at rest. She noted associated nausea, diaphoresis, dyspnea. She states the pain radiated to her left jaw. He was constant until she to a nitro (given by EMS).  After nitroglycerin x2, patient notes complete resolution of pain. At time of interview, patient asymptomatic. Patient denies having this pain on a regular basis. The last time she had it was in June when she was admitted for an non-ST elevation MI. She denies recent fever or cough. No medication noncompliance. Tolerating by mouth well. Lives at home by herself but still able to care for self. Overall severity described as moderate.  Aspirin 324mg  given by EMS  Past Medical History  Diagnosis Date  . CHF (congestive heart failure)   . Coronary artery disease   . Hypertension     Past Surgical History  Procedure Date  . Cardiac surgery     No family history on file.  History  Substance Use Topics  . Smoking status: Former Games developer  . Smokeless tobacco: Not on file  . Alcohol Use: No    OB History    Grav Para Term Preterm Abortions TAB SAB Ect Mult Living                  Review of Systems  Constitutional: Negative for fever and chills.  HENT: Negative for facial swelling.   Eyes: Negative for visual disturbance.  Respiratory: Positive for shortness of breath. Negative for cough, chest tightness and wheezing.   Cardiovascular: Positive for chest pain.  Gastrointestinal: Positive for nausea. Negative for  vomiting, abdominal pain and diarrhea.  Genitourinary: Negative for difficulty urinating.  Skin: Negative for rash.  Neurological: Negative for weakness and numbness.  Psychiatric/Behavioral: Negative for behavioral problems and confusion.  All other systems reviewed and are negative.    Allergies  Review of patient's allergies indicates no known allergies.  Home Medications   Current Outpatient Rx  Name Route Sig Dispense Refill  . ASPIRIN 81 MG PO CHEW Oral Chew 81 mg by mouth daily.      Marland Kitchen CLOPIDOGREL BISULFATE 75 MG PO TABS Oral Take 75 mg by mouth daily.      Marland Kitchen EZETIMIBE-SIMVASTATIN 10-20 MG PO TABS Oral Take 1 tablet by mouth at bedtime.      . FUROSEMIDE 40 MG PO TABS Oral Take 40 mg by mouth daily.      Marland Kitchen GABAPENTIN 300 MG PO CAPS Oral Take 300 mg by mouth 3 (three) times daily.      Marland Kitchen LEVOTHYROXINE SODIUM 25 MCG PO TABS Oral Take 25 mcg by mouth daily.      Marland Kitchen METOPROLOL TARTRATE 25 MG PO TABS Oral Take 25 mg by mouth 2 (two) times daily.      . ICAPS PO Oral Take 1 tablet by mouth daily.      Marland Kitchen NITROGLYCERIN 0.4 MG SL SUBL Sublingual Place 0.4 mg under the tongue every 5 (five) minutes as needed. Chest pain     .  OXYBUTYNIN CHLORIDE ER 10 MG PO TB24 Oral Take 5 mg by mouth daily.        BP 114/54  Pulse 68  Temp(Src) 97.6 F (36.4 C) (Oral)  Resp 14  SpO2 98%  Physical Exam  Nursing note and vitals reviewed. Constitutional: She is oriented to person, place, and time. She appears well-developed and well-nourished. No distress.  HENT:  Head: Normocephalic.  Nose: Nose normal.  Eyes: EOM are normal.  Neck: Normal range of motion. Neck supple.  Cardiovascular: Normal rate, regular rhythm and intact distal pulses.   No murmur heard. Pulmonary/Chest: Effort normal. No respiratory distress. She has rales (mild bibasilar). She exhibits no tenderness.       Good air movement throughout   Abdominal: Soft. She exhibits no distension. There is no tenderness.    Musculoskeletal: Normal range of motion. She exhibits edema (moderate, equal BLE pitting edema). She exhibits no tenderness.       No calf TTP  Neurological: She is alert and oriented to person, place, and time.       Normal strength  Skin: Skin is warm and dry. No rash noted. She is not diaphoretic.  Psychiatric: She has a normal mood and affect. Her behavior is normal. Thought content normal.    ED Course  Procedures (including critical care time)   Date: 03/22/2011  Rate: 65  Rhythm: A paced  QRS Axis: normal  Intervals: PR prolonged  ST/T Wave abnormalities: normal  Conduction Disutrbances:first-degree A-V block   Narrative Interpretation: no acute ischemic changes, a paced  Old EKG Reviewed: new a paced rhythm    Labs Reviewed  CBC - Abnormal; Notable for the following:    WBC 12.6 (*)    All other components within normal limits  BASIC METABOLIC PANEL - Abnormal; Notable for the following:    Potassium 5.6 (*)    Glucose, Bld 47 (*)    BUN 59 (*)    Creatinine, Ser 1.77 (*)    GFR calc non Af Amer 26 (*)    GFR calc Af Amer 30 (*)    All other components within normal limits  PRO B NATRIURETIC PEPTIDE  POCT I-STAT TROPONIN I  I-STAT TROPONIN I   Dg Chest 2 View  03/22/2011  *RADIOLOGY REPORT*  Clinical Data: Chest pain.  CHEST - 2 VIEW  Comparison: 09/22/2010  Findings: Stable appearance of pacemaker.  The heart size is at the upper limits of normal.  No overt edema or consolidation identified.  No pleural effusion.  Stable degenerative disease of the thoracic spine.  IMPRESSION: No active disease.  Original Report Authenticated By: Reola Calkins, M.D.     1. Chest pain       MDM   Patient with history of CAD and drug-eluting stent to RCA about 6 months ago here with chest pain. Chest pain with associated symptoms are concerning for cardiac ischemia. EKG negative for acute ischemia. Troponin is negative. Chest x-ray unremarkable. Patient asymptomatic in  ED with nitroglycerin ointment. Discussed her case with cardiologist, who will admit for further evaluation his chest pain. Patient hemodynamically stable while in ED.        Milus Glazier 03/22/11 2327

## 2011-03-22 NOTE — ED Notes (Signed)
1610-96 ready

## 2011-03-22 NOTE — H&P (Addendum)
[facility]    ADMISSION HISTORY & PHYSICAL   Chief Complaint:  Chest pain, dyspnea  HPI:  This is a 77 y.o. female with a past medical history significant for coronary artery disease status post stenting in 2004 to the right coronary artery. She presented with chest pain in March of 2011 underwent cardiac catheterization at that time and had two Promus drug eluding stents placed to the proximal and mid right coronary artery.  She percent presented again with chest pain in February 2012 and underwent cardiac catheterization which revealed patent stents normal LV function. In June 2012 she presented with chest pain was found to have a non-ST elevation MI. Cardiac catheterization then revealed a totally occluded right coronary artery. 2 additional Promus stents were placed in the distal RCA. At that time she was found to be markedly bradycardic and have high degree type II AV block. Subsequently she had a [employer] adaptive pacemaker placement by Dr. Royann Shivers. She was seen in the office last in October by Dr. Allyson Sabal and felt to be doing well. He decreased her amlodipine from 5-2.5 mg at that time due to relative hypotension. Today she reports she was out shopping and developed substernal chest pain when she returned from the store approximately lunchtime. She had not eaten previous to this since breakfast. The pain was described as squeezing and radiated to her left jaw and was similar to pain she had had in the past. She took a nitroglycerin without relief but did have relief about 10 minutes later after a second nitroglycerin. She called her niece who recommended that she come to the emergency room. In addition she has felt more short of breath recently. Initial cardiac markers are negative and there are no acute EKG changes.  PMHx:  Past Medical History  Diagnosis Date  . CHF (congestive heart failure)   . Coronary artery disease   . Hypertension   . Pacemaker   .  Hyperlipidemia   . TIA (transient ischemic attack)   . Hypothyroidism   . Insulin dependent diabetes mellitus     Past Surgical History  Procedure Date  . Cardiac surgery 09/20/2009    Pacemaker    FAMHx:  Noncontributory.  SOCHx:   reports that she has quit smoking. She does not have any smokeless tobacco history on file. She reports that she does not drink alcohol. Her drug history not on file.  ALLERGIES:  No Known Allergies  ROS: A comprehensive review of systems was negative except for: Constitutional: positive for chills Respiratory: positive for dyspnea on exertion Cardiovascular: positive for chest pain and exertional chest pressure/discomfort Endocrine: positive for diabetic symptoms including increased fatigue and temperature intolerance  HOME MEDS: Medications Prior to Admission  Medication Dose Route Frequency Provider Last Rate Last Dose  . sodium chloride 0.9 % injection 3 mL  3 mL Intravenous Q12H Mike Schinlever   3 mL at 03/22/11 1620   And  . sodium chloride 0.9 % injection 3 mL  3 mL Intravenous PRN Milus Glazier       And  . 0.9 %  sodium chloride infusion  250 mL Intravenous PRN Mike Schinlever 20 mL/hr at 03/22/11 1618 250 mL at 03/22/11 1618  . dextrose 50 % solution 25 g  25 g Intravenous Once Dayton Bailiff, MD   25 g at 03/22/11 1734  . nitroGLYCERIN (NITROGLYN) 2 % ointment 1 inch  1 inch Topical Q6H Mike Schinlever   1 inch at 03/22/11 [phone]  No current outpatient prescriptions on file as of 03/22/2011.    LABS/IMAGING: Results for orders placed during the hospital encounter of 03/22/11 (from the past 48 hour(s))  CBC     Status: Abnormal   Collection Time   03/22/11  3:54 PM      Component Value Range Comment   WBC 12.6 (*) 4.0 - 10.5 (K/uL)    RBC 4.65  3.87 - 5.11 (MIL/uL)    Hemoglobin 14.2  12.0 - 15.0 (g/dL)    HCT 16.1  09.6 - 04.5 (%)    MCV 90.3  78.0 - 100.0 (fL)    MCH 30.5  26.0 - 34.0 (pg)    MCHC 33.8  30.0 - 36.0 (g/dL)     RDW 40.9  81.1 - 91.4 (%)    Platelets 189  150 - 400 (K/uL)   BASIC METABOLIC PANEL     Status: Abnormal   Collection Time   03/22/11  3:54 PM      Component Value Range Comment   Sodium 142  135 - 145 (mEq/L)    Potassium 5.6 (*) 3.5 - 5.1 (mEq/L)    Chloride 109  96 - 112 (mEq/L)    CO2 24  19 - 32 (mEq/L)    Glucose, Bld 47 (*) 70 - 99 (mg/dL)    BUN 59 (*) 6 - 23 (mg/dL)    Creatinine, Ser 7.82 (*) 0.50 - 1.10 (mg/dL)    Calcium 95.6  8.4 - 10.5 (mg/dL)    GFR calc non Af Amer 26 (*) >90 (mL/min)    GFR calc Af Amer 30 (*) >90 (mL/min)   PRO B NATRIURETIC PEPTIDE     Status: Normal   Collection Time   03/22/11  3:55 PM      Component Value Range Comment   Pro B Natriuretic peptide (BNP) 127.0  0 - 450 (pg/mL)   POCT I-STAT TROPONIN I     Status: Normal   Collection Time   03/22/11  5:35 PM      Component Value Range Comment   Troponin i, poc 0.01  0.00 - 0.08 (ng/mL)    Comment 3             Dg Chest 2 View  03/22/2011  *RADIOLOGY REPORT*  Clinical Data: Chest pain.  CHEST - 2 VIEW  Comparison: 09/22/2010  Findings: Stable appearance of pacemaker.  The heart size is at the upper limits of normal.  No overt edema or consolidation identified.  No pleural effusion.  Stable degenerative disease of the thoracic spine.  IMPRESSION: No active disease.  Original Report Authenticated By: Reola Calkins, M.D.   EKG:  Atrial paced rhythm at 65.   VITALS: Blood pressure 114/54, pulse 68, temperature 97.6 F (36.4 C), temperature source Oral, resp. rate 14, SpO2 98.00%.  EXAM: General appearance: alert, appears older than stated age and no distress Neck: JVD - 2 cm above sternal notch, no adenopathy, no carotid bruit, supple, symmetrical, trachea midline and thyroid not enlarged, symmetric, no tenderness/mass/nodules Lungs: diminished breath sounds bibasilar and rales bibasilar Heart: regular rate and rhythm, S1, S2 normal, no murmur, click, rub or gallop Abdomen: soft,  non-tender; bowel sounds normal; no masses,  no organomegaly Extremities: edema 1+ RLE edema, trace on the left Pulses: 2+ and symmetric Skin: pale skin Neurologic: Grossly normal  IMPRESSION: 1. Chest pain, possibly unstable angina 2. Increasing dyspnea most likely secondary to decompensated heart failure 3. Heart block status post pacemaker 4. Hypertension  5. Dyslipidemia 6. Insulin-dependent diabetes 7. Hypothyroidism 8. Leukocytosis 9. Hyperkalemia 10. Hypoglycemia 11. Acute kidney injury, possible CKD I or II 12. Hematochoezia  PLAN: 1. Ms. Pellicano presents with symptoms concerning for unstable angina, however, she also has a white count, chills and has been hypoglycemic. Would like to start her on ACS protocol and rule out myocardial infarction. Laboratory work demonstrates hyperkalemia, hypoglycemia and an elevated WBC ct. There may be an underlying infection.  BNP is 127.  Will hydrate. She reports her "kidneys have been acting up".  May be acute on chronic renal failure. Check urinalysis and renal ultrasound. She has had chills, but no fever. She also reported a small amount of blood in her stool this morning. Will heme check. Plan to admit her to a telemetry bed for further monitoring. If she rules out for MI then she may be appropriate for stress testing.  Chrystie Nose, MD Attending Cardiologist [facility]  HILTY,Kenneth C 03/22/2011, 6:46 PM

## 2011-03-22 NOTE — ED Notes (Signed)
Placed call for diet tray 

## 2011-03-22 NOTE — ED Notes (Signed)
Pt to ED via EMS with c/o chest pain.  States onset one hour ago.  Given ASA 324mg  and NTG SL x2.   Pt also experienced diaphoresis, N/V.  Upon arrival to ED, Pt symptoms resolved.

## 2011-03-23 ENCOUNTER — Encounter (HOSPITAL_COMMUNITY): Payer: Self-pay | Admitting: *Deleted

## 2011-03-23 ENCOUNTER — Inpatient Hospital Stay (HOSPITAL_COMMUNITY): Payer: Medicare Other

## 2011-03-23 LAB — URINALYSIS, ROUTINE W REFLEX MICROSCOPIC
Ketones, ur: NEGATIVE mg/dL
Nitrite: NEGATIVE
Specific Gravity, Urine: 1.015 (ref 1.005–1.030)
Urobilinogen, UA: 0.2 mg/dL (ref 0.0–1.0)
pH: 5.5 (ref 5.0–8.0)

## 2011-03-23 LAB — HEMOGLOBIN A1C
Hgb A1c MFr Bld: 7 % — ABNORMAL HIGH (ref <5.7)
Mean Plasma Glucose: 154 mg/dL — ABNORMAL HIGH (ref <117)

## 2011-03-23 LAB — BASIC METABOLIC PANEL
CO2: 20 mEq/L (ref 19–32)
Chloride: 112 mEq/L (ref 96–112)
Glucose, Bld: 132 mg/dL — ABNORMAL HIGH (ref 70–99)
Potassium: 4.3 mEq/L (ref 3.5–5.1)
Sodium: 145 mEq/L (ref 135–145)

## 2011-03-23 LAB — CBC
Hemoglobin: 12.6 g/dL (ref 12.0–15.0)
Hemoglobin: 13.6 g/dL (ref 12.0–15.0)
MCH: 29.2 pg (ref 26.0–34.0)
MCH: 30 pg (ref 26.0–34.0)
MCHC: 32.8 g/dL (ref 30.0–36.0)
Platelets: 199 10*3/uL (ref 150–400)
RBC: 4.53 MIL/uL (ref 3.87–5.11)
WBC: 10.5 10*3/uL (ref 4.0–10.5)

## 2011-03-23 LAB — GLUCOSE, CAPILLARY
Glucose-Capillary: 135 mg/dL — ABNORMAL HIGH (ref 70–99)
Glucose-Capillary: 142 mg/dL — ABNORMAL HIGH (ref 70–99)
Glucose-Capillary: 186 mg/dL — ABNORMAL HIGH (ref 70–99)
Glucose-Capillary: 269 mg/dL — ABNORMAL HIGH (ref 70–99)

## 2011-03-23 LAB — COMPREHENSIVE METABOLIC PANEL
Albumin: 3.4 g/dL — ABNORMAL LOW (ref 3.5–5.2)
Alkaline Phosphatase: 101 U/L (ref 39–117)
BUN: 53 mg/dL — ABNORMAL HIGH (ref 6–23)
Calcium: 9.4 mg/dL (ref 8.4–10.5)
Potassium: 4.2 mEq/L (ref 3.5–5.1)
Total Protein: 6.8 g/dL (ref 6.0–8.3)

## 2011-03-23 LAB — LIPID PANEL
Cholesterol: 128 mg/dL (ref 0–200)
HDL: 59 mg/dL (ref >39)
Triglycerides: 126 mg/dL (ref <150)

## 2011-03-23 LAB — CARDIAC PANEL(CRET KIN+CKTOT+MB+TROPI)
Relative Index: INVALID (ref 0.0–2.5)
Relative Index: INVALID (ref 0.0–2.5)
Relative Index: INVALID (ref 0.0–2.5)
Total CK: 66 U/L (ref 7–177)
Troponin I: <0.3 ng/mL (ref <0.30)
Troponin I: <0.3 ng/mL (ref <0.30)

## 2011-03-23 LAB — DIFFERENTIAL
Basophils Relative: 0 % (ref 0–1)
Eosinophils Absolute: 0.3 10*3/uL (ref 0.0–0.7)
Monocytes Relative: 10 % (ref 3–12)
Neutrophils Relative %: 55 % (ref 43–77)

## 2011-03-23 LAB — URINE MICROSCOPIC-ADD ON

## 2011-03-23 MED ORDER — AMLODIPINE BESYLATE 2.5 MG PO TABS
2.5000 mg | ORAL_TABLET | Freq: Every day | ORAL | Status: DC
  Administered 2011-03-23 – 2011-03-25 (×3): 2.5 mg via ORAL
  Filled 2011-03-23 (×3): qty 1

## 2011-03-23 MED ORDER — LEVOTHYROXINE SODIUM 25 MCG PO TABS
25.0000 ug | ORAL_TABLET | Freq: Every day | ORAL | Status: DC
  Administered 2011-03-23 – 2011-03-25 (×3): 25 ug via ORAL
  Filled 2011-03-23 (×3): qty 1

## 2011-03-23 MED ORDER — ASPIRIN 81 MG PO CHEW
81.0000 mg | CHEWABLE_TABLET | Freq: Every day | ORAL | Status: DC
  Administered 2011-03-23 – 2011-03-25 (×3): 81 mg via ORAL
  Filled 2011-03-23 (×2): qty 1

## 2011-03-23 MED ORDER — CLOPIDOGREL BISULFATE 75 MG PO TABS
75.0000 mg | ORAL_TABLET | Freq: Every day | ORAL | Status: DC
  Administered 2011-03-23: 75 mg via ORAL
  Filled 2011-03-23 (×2): qty 1

## 2011-03-23 MED ORDER — OCUVITE-LUTEIN PO CAPS
1.0000 | ORAL_CAPSULE | Freq: Every day | ORAL | Status: DC
  Administered 2011-03-23 – 2011-03-25 (×3): 1 via ORAL
  Filled 2011-03-23 (×4): qty 1

## 2011-03-23 MED ORDER — OXYBUTYNIN CHLORIDE ER 5 MG PO TB24
5.0000 mg | ORAL_TABLET | Freq: Every day | ORAL | Status: DC
  Administered 2011-03-23 – 2011-03-25 (×3): 5 mg via ORAL
  Filled 2011-03-23 (×3): qty 1

## 2011-03-23 MED ORDER — GABAPENTIN 300 MG PO CAPS
300.0000 mg | ORAL_CAPSULE | Freq: Three times a day (TID) | ORAL | Status: DC
  Administered 2011-03-23 – 2011-03-25 (×7): 300 mg via ORAL
  Filled 2011-03-23 (×9): qty 1

## 2011-03-23 MED ORDER — METOPROLOL TARTRATE 25 MG PO TABS
25.0000 mg | ORAL_TABLET | Freq: Two times a day (BID) | ORAL | Status: DC
  Administered 2011-03-23 – 2011-03-25 (×6): 25 mg via ORAL
  Filled 2011-03-23 (×7): qty 1

## 2011-03-23 MED ORDER — NITROGLYCERIN 0.4 MG SL SUBL: 0.4000 mg | SUBLINGUAL_TABLET | SUBLINGUAL | Status: DC | PRN

## 2011-03-23 MED ORDER — EZETIMIBE-SIMVASTATIN 10-20 MG PO TABS
1.0000 | ORAL_TABLET | Freq: Every day | ORAL | Status: DC
  Administered 2011-03-24 (×2): 1 via ORAL
  Filled 2011-03-23 (×3): qty 1

## 2011-03-23 NOTE — Clinical Documentation Improvement (Signed)
CHF DOCUMENTATION CLARIFICATION QUERY  THIS DOCUMENT IS NOT A PERMANENT PART OF THE MEDICAL RECORD  TO RESPOND TO THE THIS QUERY, FOLLOW THE INSTRUCTIONS BELOW:  1. If needed, update documentation for the patient's encounter via the notes activity.  2. Access this query again and click edit on the In [employer].  3. After updating, or not, click F2 to complete all highlighted (required) fields concerning your review. Select "additional documentation in the medical record" OR "no additional documentation provided".  4. Click Sign note button.  5. The deficiency will fall out of your In Basket *Please let us know if you are not able to complete this workflow by phone or e-mail (listed below).  Please update your documentation within the medical record to reflect your response to this query.                                                                                    03/23/11  Dear Dr.Hilty/ Associates,  In a better effort to capture your patient's severity of illness, reflect appropriate length of stay and utilization of resources, a review of the patient medical record has revealed the following indicators the diagnosis of Heart Failure.    Based on your clinical judgment, please clarify and document in a progress note and/or discharge summary the clinical condition associated with the following supporting information:   Possible Clinical Conditions?  Chronic Systolic Congestive Heart Failure Chronic Diastolic Congestive Heart Failure Chronic Systolic & Diastolic Congestive Heart Failure Acute Systolic Congestive Heart Failure Acute Diastolic Congestive Heart Failure Acute Systolic & Diastolic Congestive Heart Failure Acute on Chronic Systolic Congestive Heart Failure Acute on Chronic Diastolic Congestive Heart Failure Acute on Chronic Systolic & Diastolic  Congestive Heart Failure Other Condition________________________________________ Cannot Clinically  Determine   Risk Factors:  Dyspnea most likely 2/2 decompensated CHF noted per 12/19 progress note. Please specify type of CHF in the progress notes.   Reviewed: additional documentation in the medical record  Thank You,  Marciano Sequin,  Clinical Documentation Specialist:  Pager: [phone]  Health Information Management [place]

## 2011-03-23 NOTE — Progress Notes (Signed)
[facility]  Subjective: No More CP.  No SOB  Objective: Vital signs in last 24 hours: Temp:  [97.6 F (36.4 C)] 97.6 F (36.4 C) (12/18 2142) Pulse Rate:  [61-79] 66  (12/19 0416) Resp:  [14-20] 20  (12/19 0416) BP: (92-166)/(43-69) 118/48 mmHg (12/19 0416) SpO2:  [90 %-100 %] 96 % (12/19 0416) Weight:  [77.111 kg (170 lb)] 170 lb (77.111 kg) (12/18 1933) Last BM Date: 03/21/11  Intake/Output from previous day: 12/18 0701 - 12/19 0700 In: 384.2 [I.V.:384.2] Out: 600 [Urine:600] Intake/Output this shift:    Medications Current Facility-Administered Medications  Medication Dose Route Frequency Provider Last Rate Last Dose  . sodium chloride 0.9 % injection 3 mL  3 mL Intravenous PRN Milus Glazier       And  . 0.9 %  sodium chloride infusion  250 mL Intravenous PRN Mike Schinlever 20 mL/hr at 03/22/11 1618 250 mL at 03/22/11 1618  . acetaminophen (TYLENOL) tablet 650 mg  650 mg Oral Q4H PRN Lisette Abu. Hilty   650 mg at 03/23/11 0701  . amLODipine (NORVASC) tablet 2.5 mg  2.5 mg Oral Daily Chrystie Nose      . aspirin chewable tablet 324 mg  324 mg Oral NOW Lisette Abu. Hilty   324 mg at 03/23/11 0030   Or  . aspirin suppository 300 mg  300 mg Rectal NOW Chrystie Nose      . aspirin chewable tablet 81 mg  81 mg Oral Daily Lisette Abu. Hilty      . aspirin EC tablet 81 mg  81 mg Oral Daily Lisette Abu. Hilty      . clopidogrel (PLAVIX) tablet 75 mg  75 mg Oral Daily Lisette Abu. Hilty      . dextrose 50 % solution 25 g  25 g Intravenous Once Dayton Bailiff, MD   25 g at 03/22/11 1734  . ezetimibe-simvastatin (VYTORIN) 10-20 MG per tablet 1 tablet  1 tablet Oral QHS Chrystie Nose      . gabapentin (NEURONTIN) capsule 300 mg  300 mg Oral TID Chrystie Nose      . heparin ADULT infusion 100 units/ml (25000 units/250 ml)  850 Units/hr Intravenous Continuous Maryanna Shape [place], PHARMD 8.5 mL/hr at 03/22/11 2023 850 Units/hr at 03/22/11 2023  .  heparin bolus via infusion 3,500 Units  3,500 Units Intravenous Once [facility] [place], [place]   3,500 Units at 03/22/11 2022  . insulin aspart (novoLOG) injection 0-15 Units  0-15 Units Subcutaneous Q4H Chrystie Nose      . levothyroxine (SYNTHROID, LEVOTHROID) tablet 25 mcg  25 mcg Oral Daily Lisette Abu. Hilty      . metoprolol tartrate (LOPRESSOR) tablet 25 mg  25 mg Oral BID Lisette Abu. Hilty   25 mg at 03/23/11 2956  . multivitamin-lutein (OCUVITE-LUTEIN) capsule 1 capsule  1 capsule Oral Daily Lisette Abu. Hilty      . nitroGLYCERIN (NITROGLYN) 2 % ointment 1 inch  1 inch Topical Q6H Mike Schinlever   1 inch at 03/23/11 0650  . nitroGLYCERIN (NITROSTAT) SL tablet 0.4 mg  0.4 mg Sublingual Q5 min PRN Chrystie Nose      . nitroGLYCERIN (NITROSTAT) SL tablet 0.4 mg  0.4 mg Sublingual Q5 min PRN Chrystie Nose      . ondansetron (ZOFRAN) injection 4 mg  4 mg Intravenous Q6H PRN Chrystie Nose      . oxybutynin (DITROPAN-XL) 24 hr tablet  5 mg  5 mg Oral Daily Lisette Abu. Hilty      . DISCONTD: 0.9 %  sodium chloride infusion   Intravenous Continuous Chrystie Nose      . DISCONTD: sodium chloride 0.9 % injection 3 mL  3 mL Intravenous Q12H Mike Schinlever   3 mL at 03/22/11 1620    PE: General appearance: alert, cooperative and no distress Lungs: clear to auscultation bilaterally Heart: regular rate and rhythm, S1, S2 normal, no murmur, click, rub or gallop Extremities: LEE on the right Pulses: 2+ and symmetric radials  Lab Results:   Basename 03/23/11 0618 03/22/11 2330 03/22/11 1554  WBC 10.5 9.8 12.6*  HGB 13.6 12.6 14.2  HCT 40.4 38.4 42.0  PLT 208 199 189   BMET  Basename 03/23/11 0618 03/22/11 2330 03/22/11 1554  NA 145 141 142  K 4.3 4.2 5.6*  CL 112 109 109  CO2 20 22 24   GLUCOSE 132* 278* 47*  BUN 48* 53* 59*  CREATININE 1.57* 1.61* 1.77*  CALCIUM 9.3 9.4 10.0    Basename 03/23/11 0618  CHOL 128   Cardiac Enzymes Pending  P2Y12   237 __________________ Studies/Results: CHEST - 2 VIEW  Comparison: 09/22/2010  Findings: Stable appearance of pacemaker. The heart size is at the  upper limits of normal. No overt edema or consolidation  identified. No pleural effusion. Stable degenerative disease of  the thoracic spine.  IMPRESSION:  No active disease.  Assessment/Plan  Principal Problem:  *Chest pain  1. Chest pain, possibly unstable angina 2. Increasing dyspnea most likely secondary to decompensated heart failure 3. Heart block status post pacemaker 4. Hypertension 5. Dyslipidemia 6. Insulin-dependent diabetes 7. Hypothyroidism 8. Leukocytosis 9. Hyperkalemia 10. Hypoglycemia 11. Acute kidney injury, possible CKD I or II 12. Hematochezia  CAD: In June 2012 she presented with chest pain was found to have a non-ST elevation MI.   Cardiac catheterization then revealed a totally occluded right coronary artery. 2 additional Promus  stents were placed in the distal RCA   Plan:  Pain free.   AM BP, HR stable and controlled. P2Y12 not appropriate for someone on plavix. Recommend switching to Effient.  Also recommend Lexiscan in-light of P2Y12 results.  Cardiac enzymes pending.   LOS: 1 day    HAGER,BRYAN W 03/23/2011 7:52 AM  I have seen and examined the patient along with Wilburt Finlay, PA, NP.  I have reviewed the chart, notes and new data.  I agree with Bryan's note.  Key new complaints: NO further CP or SOB Key examination changes: Agree with exam above  Key new findings / data: Trop neg x 2 - likely r/o MI  PLAN: Proceed with Myoview in AM per initial plan. With known CAD & somewhat concerning symptoms, would prefer to monitor o/n & perform NST here. P2Y12 level is borderline - agree with changing from Plavix - but would change to Brilinta & not Effient given age.     Marykay Lex, M.D., M.S. [facility] [address] [place], [place]   [address]  [phone]  03/23/2011 6:15 PM

## 2011-03-23 NOTE — Progress Notes (Signed)
Inpatient Diabetes Program Recommendations  AACE/ADA: New Consensus Statement on Inpatient Glycemic Control (2009)  Target Ranges:  Prepandial:   less than 140 mg/dL      Peak postprandial:   less than 180 mg/dL (1-2 hours)      Critically ill patients:  140 - 180 mg/dL   Reason for Visit: Pt now eating.  Need change correction schedule and potentially add NPH (as at home)  Inpatient Diabetes Program Recommendations Insulin - Basal: Pt takes NPH 54 units in the am and 20 units at HS.  Pt will need at least a portion of home dose NPH (10 units am and 20 units at HS) or Lantus 15--20 units Correction (SSI): once eating, please change to tidwc plus HS coverage  Note: Lenor Coffin, RN, CNS [phone])

## 2011-03-23 NOTE — Progress Notes (Signed)
ANTICOAGULATION CONSULT NOTE - Follow Up Consult  Pharmacy Consult for heparin Indication: chest pain/ACS  No Known Allergies  Patient Measurements: Height: 5\' 3"  (160 cm) Weight: 170 lb (77.111 kg) IBW/kg (Calculated) : 52.4  Adjusted Body Weight:   Vital Signs: Temp: 97.6 F (36.4 C) (12/18 2142) Temp src: Oral (12/19 0416) BP: 118/48 mmHg (12/19 0416) Pulse Rate: 66  (12/19 0416)  Labs:  Basename 03/23/11 0618 03/22/11 2352 03/22/11 2330 03/22/11 1554  HGB 13.6 -- 12.6 --  HCT 40.4 -- 38.4 42.0  PLT 208 -- 199 189  APTT -- -- -- --  LABPROT -- -- -- --  INR -- -- -- --  HEPARINUNFRC 0.42 -- -- --  CREATININE 1.57* -- 1.61* 1.77*  CKTOTAL -- 70 -- --  CKMB -- 2.2 -- --  TROPONINI -- <0.30 -- --   Estimated Creatinine Clearance: 28.1 ml/min (by C-G formula based on Cr of 1.57).    Assessment: Patient is an 77 y.o F on heparin for CP.  Heparin level now back therapeutic at 0.42.  Goal of Therapy:  Heparin level 0.3-0.7 units/ml   Plan:  1) Continue current heparin drip at 850 units/hr  Halston Fairclough P 03/23/2011,8:52 AM

## 2011-03-23 NOTE — Progress Notes (Signed)
UR Completed.  Meyer Dockery Norris 03/23/2011 [phone]  

## 2011-03-23 NOTE — Progress Notes (Signed)
CARE MANAGEMENT NOTE HEART FAILURE  03/23/2011   Patient:  [name], [name]   Account Number:  [id number]    Date Initiated:  03/23/2011  Documentation initiated by:  Shannan Harper  Subjective/Objective Assessment:   Patient admitted with CP and extensive cardiac hx including CAD and CHF.  She will be worked up for a rule out MI.   Action/Plan:   Patient lives alone at home with [facility] providing both an Charity fundraiser and Aide.   Anticipated DC Date:    Anticipated DC Plan:  HOME W HOME HEALTH SERVICES  DC Planning Services:  CM consult    [facility] Choice:  HOME HEALTH   Choice offered to / List presented to:  C-1 Patient    HH arranged:  HH-1 RN  HH-10 DISEASE MANAGEMENT  HH-4 NURSE'S AIDE     HH agency:  [facility]    Status of service:  In process, will continue to follow  Medicare Important Message Given:   (If response is "NO", the following Medicare IM given date fields will be blank) Date Medicare IM Given:   Date Additional Medicare IM Given:    Discharge Disposition:    Per UR Regulation:  Reviewed for med. necessity/level of care/duration of stay  Comments:   UR Completed. 03/23/11 1149 Shannan Harper, RN, BSN  Met with patient today and she wants to contine with [facility] on Discharge.  She will need a scale which will be provided prior to discharge.  Was given a heart failure packet and discussed the education extensively with her in regars to diet, salt, weight, zones, and diagnosis.  She stated she had never been told she had CHF.  It was apparent she knew her heart was not strong but didn't know the word for the disease process. Called [facility] to notify them of the patient's current admission although patient stated they were aware.  Will send discharge summary and resumption of services at discharge to agency.  Message left as office was closed.  Will continue to monitor. 03/23/11 1641 Shannan Harper, RN,  BSN   Initial CM contact:  03/23/2011 12:00 M  By:  Shannan Harper Initial CSW contact:     By:      Is this an INP Readmission < 30 days:  N (If "YES" please see readmission information at the bottom of note)  Patient living status prior to this admission:  ALONE  Patient setting prior to this admission:  HOME  Comorbid conditions being treated that contributed to this admission:  HIGH-CHF, CAD, DM, HTN, TIA  CHF Readmission Risk:  high  Type of patient education provided  HF Patient Education Assessment / Teach Back  HF Zone Tool / Magnet  Limit salt intake  Weigh daily     Patient education provided by  [facility]    Was referral made to [facility]:    Is the patient's PCP the same as attending:   PCP:    Readmission < 30 Days If pt has HH, did they contact the agency before going to the ED:   [facility] agency:    Was the follow-up physician visit scheduled prior to discharge:    Did the patient follow-up with the physician prior to this readmission:    Was there HF Clinic visits prior to readmission:    Were there ED visits between admissions:    Readmit type:    If unscheduled and related indicate reason for readmit:

## 2011-03-24 ENCOUNTER — Inpatient Hospital Stay (HOSPITAL_COMMUNITY): Payer: Medicare Other

## 2011-03-24 LAB — BASIC METABOLIC PANEL
CO2: 22 mEq/L (ref 19–32)
Chloride: 113 mEq/L — ABNORMAL HIGH (ref 96–112)
Creatinine, Ser: 1.6 mg/dL — ABNORMAL HIGH (ref 0.50–1.10)
GFR calc Af Amer: 34 mL/min — ABNORMAL LOW (ref >90)
Potassium: 4.7 mEq/L (ref 3.5–5.1)

## 2011-03-24 LAB — CBC
MCV: 90.1 fL (ref 78.0–100.0)
Platelets: 190 10*3/uL (ref 150–400)
RBC: 4.13 MIL/uL (ref 3.87–5.11)
RDW: 14.7 % (ref 11.5–15.5)
WBC: 9.9 10*3/uL (ref 4.0–10.5)

## 2011-03-24 LAB — HEPARIN LEVEL (UNFRACTIONATED): Heparin Unfractionated: 0.48 IU/mL (ref 0.30–0.70)

## 2011-03-24 LAB — GLUCOSE, CAPILLARY: Glucose-Capillary: 162 mg/dL — ABNORMAL HIGH (ref 70–99)

## 2011-03-24 MED ORDER — TECHNETIUM TC 99M TETROFOSMIN IV KIT
10.0000 | PACK | Freq: Once | INTRAVENOUS | Status: AC | PRN
  Administered 2011-03-24: 10 via INTRAVENOUS

## 2011-03-24 MED ORDER — TECHNETIUM TC 99M TETROFOSMIN IV KIT
30.0000 | PACK | Freq: Once | INTRAVENOUS | Status: AC | PRN
  Administered 2011-03-24: 30 via INTRAVENOUS

## 2011-03-24 MED ORDER — TICAGRELOR 90 MG PO TABS
90.0000 mg | ORAL_TABLET | Freq: Two times a day (BID) | ORAL | Status: DC
  Administered 2011-03-24 – 2011-03-25 (×3): 90 mg via ORAL
  Filled 2011-03-24 (×4): qty 1

## 2011-03-24 MED ORDER — REGADENOSON 0.4 MG/5ML IV SOLN
0.4000 mg | Freq: Once | INTRAVENOUS | Status: AC
  Administered 2011-03-24: 0.4 mg via INTRAVENOUS

## 2011-03-24 NOTE — Progress Notes (Signed)
[facility]  Subjective:  No Complaints  Objective: Vital signs in last 24 hours: Temp:  [97.4 F (36.3 C)-97.6 F (36.4 C)] 97.6 F (36.4 C) (12/20 0428) Pulse Rate:  [62-64] 64  (12/20 0428) Resp:  [18-20] 19  (12/20 0428) BP: (103-118)/(48-68) 103/48 mmHg (12/20 0428) SpO2:  [94 %-96 %] 94 % (12/20 0428) Last BM Date: 03/23/11  Intake/Output from previous day: 12/19 0701 - 12/20 0700 In: 840 [P.O.:840] Out: 750 [Urine:750] Intake/Output this shift:    Medications Current Facility-Administered Medications  Medication Dose Route Frequency Provider Last Rate Last Dose  . sodium chloride 0.9 % injection 3 mL  3 mL Intravenous PRN Milus Glazier       And  . 0.9 %  sodium chloride infusion  250 mL Intravenous PRN Mike Schinlever 20 mL/hr at 03/22/11 1618 250 mL at 03/22/11 1618  . acetaminophen (TYLENOL) tablet 650 mg  650 mg Oral Q4H PRN Lisette Abu. Hilty   650 mg at 03/23/11 0701  . amLODipine (NORVASC) tablet 2.5 mg  2.5 mg Oral Daily Lisette Abu. Hilty   2.5 mg at 03/23/11 0936  . aspirin chewable tablet 81 mg  81 mg Oral Daily Lisette Abu. Hilty   81 mg at 03/23/11 1610  . clopidogrel (PLAVIX) tablet 75 mg  75 mg Oral Daily Lisette Abu. Hilty   75 mg at 03/23/11 0936  . ezetimibe-simvastatin (VYTORIN) 10-20 MG per tablet 1 tablet  1 tablet Oral QHS Lisette Abu Hilty   1 tablet at 03/24/11 0024  . gabapentin (NEURONTIN) capsule 300 mg  300 mg Oral TID Lisette Abu. Hilty   300 mg at 03/24/11 0024  . heparin ADULT infusion 100 units/ml (25000 units/250 ml)  850 Units/hr Intravenous Continuous Maryanna Shape [place], PHARMD 8.5 mL/hr at 03/22/11 2023 850 Units/hr at 03/22/11 2023  . insulin aspart (novoLOG) injection 0-15 Units  0-15 Units Subcutaneous Q4H Lisette Abu Hilty   2 Units at 03/24/11 0759  . levothyroxine (SYNTHROID, LEVOTHROID) tablet 25 mcg  25 mcg Oral Daily Lisette Abu. Hilty   25 mcg at 03/23/11 0936  . metoprolol tartrate (LOPRESSOR)  tablet 25 mg  25 mg Oral BID Lisette Abu. Hilty   25 mg at 03/24/11 0025  . multivitamin-lutein (OCUVITE-LUTEIN) capsule 1 capsule  1 capsule Oral Daily Lisette Abu. Hilty   1 capsule at 03/23/11 1154  . nitroGLYCERIN (NITROGLYN) 2 % ointment 1 inch  1 inch Topical Q6H Mike Schinlever   1 inch at 03/24/11 0026  . nitroGLYCERIN (NITROSTAT) SL tablet 0.4 mg  0.4 mg Sublingual Q5 min PRN Chrystie Nose      . nitroGLYCERIN (NITROSTAT) SL tablet 0.4 mg  0.4 mg Sublingual Q5 min PRN Chrystie Nose      . ondansetron (ZOFRAN) injection 4 mg  4 mg Intravenous Q6H PRN Chrystie Nose      . oxybutynin (DITROPAN-XL) 24 hr tablet 5 mg  5 mg Oral Daily Lisette Abu. Hilty   5 mg at 03/23/11 0935  . DISCONTD: aspirin EC tablet 81 mg  81 mg Oral Daily Chrystie Nose        PE: General appearance: alert, cooperative and no distress Lungs: clear to auscultation bilaterally Heart: regular rate and rhythm, S1, S2 normal, no murmur, click, rub or gallop Extremities: No LEE Pulses: 2+ radials  Lab Results:   Basename 03/24/11 0635 03/23/11 0618 03/22/11 2330  WBC 9.9 10.5 9.8  HGB 12.4 13.6 12.6  HCT  37.2 40.4 38.4  PLT 190 208 199   BMET  Basename 03/23/11 0618 03/22/11 2330 03/22/11 1554  NA 145 141 142  K 4.3 4.2 5.6*  CL 112 109 109  CO2 20 22 24   GLUCOSE 132* 278* 47*  BUN 48* 53* 59*  CREATININE 1.57* 1.61* 1.77*  CALCIUM 9.3 9.4 10.0   PT/INR No results found for this basename: LABPROT:3,INR:3 in the last 72 hours Cholesterol  Basename 03/23/11 0618  CHOL 128    Assessment/Plan  Principal Problem:  *Chest pain *Chest pain   1. Chest pain, possibly unstable angina 2. Increasing dyspnea most likely secondary to decompensated heart failure 3. Heart block status post pacemaker 4. Hypertension 5. Dyslipidemia 6. Insulin-dependent diabetes 7. Hypothyroidism 8. Leukocytosis 9. Hyperkalemia 10. Hypoglycemia 11. Acute kidney injury, possible CKD I or II 12. Hematochezia CAD:  In June 2012 she presented with chest pain was found to have a non-ST elevation MI. Cardiac catheterization then revealed a totally occluded right coronary artery. 2 additional Promus stents were placed in the distal RCA  Plan:  DCing Plavix,  Starting Brilinta 90mg  Twice daily.  Lexiscan myoview today.  Home  Today if NST ok.  Pt would like to stay until tomorrow because she will have someone at home then.   LOS: 2 days    HAGER,BRYAN W 03/24/2011 8:41 AM   Pt seen & examined after Mr. Leron Croak.  I have reviewed the chart & his findings/plan.  I agree with the note.   H/o CAD- PCI p/w atypical CP -- NST today.  - no further CP, BP ok. -- NST done (I have reviewed off line & appears to be normal), will await formal read. Agree with change to Brilinta (no need to load).  If NST normal & read available - would be ready for discharge.    Marykay Lex, M.D., M.S. [facility] [address] [place], [place]  [address]  [phone]  03/24/2011 2:52 PM

## 2011-03-24 NOTE — Progress Notes (Signed)
Patient was down for NST.  NCM placed scale on her bedside and notified her assigned nurse, Nego.  Benefit check has been sent off for Brilinta.  Will attempt to get pharmacy of choice by patient when she arrives back from procedure.  Spoke with Burna Mortimer at [employer] and will fax a discharge summary to the agency upon discharge for resumption of services.  Will continue to assist as needed. 03/24/11 1417 Shannan Harper, RN, BSN [phone]

## 2011-03-24 NOTE — Progress Notes (Signed)
ANTICOAGULATION CONSULT NOTE - Follow Up Consult  Pharmacy Consult for heparin Indication: chest pain/ACS  No Known Allergies  Patient Measurements: Height: 5\' 3"  (160 cm) Weight: 170 lb (77.111 kg) IBW/kg (Calculated) : 52.4  Adjusted Body Weight for heparin dosing: 69kg  Vital Signs: Temp: 97.6 F (36.4 C) (12/20 0428) Temp src: Oral (12/20 0428) BP: 103/48 mmHg (12/20 0428) Pulse Rate: 64  (12/20 0428)  Labs:  Basename 03/24/11 0635 03/23/11 1200 03/23/11 0840 03/23/11 0618 03/22/11 2352 03/22/11 2330  HGB 12.4 -- -- 13.6 -- --  HCT 37.2 -- -- 40.4 -- 38.4  PLT 190 -- -- 208 -- 199  APTT -- -- -- -- -- --  LABPROT -- -- -- -- -- --  INR -- -- -- -- -- --  HEPARINUNFRC 0.48 -- -- 0.42 -- --  CREATININE 1.60* -- -- 1.57* -- 1.61*  CKTOTAL -- 66 66 -- 70 --  CKMB -- 2.2 2.0 -- 2.2 --  TROPONINI -- <0.30 <0.30 -- <0.30 --   Estimated Creatinine Clearance: 27.6 ml/min (by C-G formula based on Cr of 1.6).    Assessment: Patient is an 77 y.o F on heparin for CP.  Heparin level therapeutic. No bleeding noted.  Noted plans for myoview today.  Goal of Therapy:  Heparin level 0.3-0.7 units/ml   Plan:  1) Continue current heparin drip at 850 units/hr 2) F/U am labs and plans post-procedure.  Elson Clan 03/24/2011,9:44 AM

## 2011-03-25 LAB — CBC
MCH: 29.8 pg (ref 26.0–34.0)
MCHC: 33.2 g/dL (ref 30.0–36.0)
MCV: 89.7 fL (ref 78.0–100.0)
Platelets: 194 10*3/uL (ref 150–400)
RDW: 14.6 % (ref 11.5–15.5)

## 2011-03-25 LAB — BASIC METABOLIC PANEL
CO2: 21 mEq/L (ref 19–32)
Calcium: 9.5 mg/dL (ref 8.4–10.5)
Creatinine, Ser: 1.6 mg/dL — ABNORMAL HIGH (ref 0.50–1.10)
GFR calc Af Amer: 34 mL/min — ABNORMAL LOW (ref >90)
GFR calc non Af Amer: 29 mL/min — ABNORMAL LOW (ref >90)

## 2011-03-25 LAB — GLUCOSE, CAPILLARY: Glucose-Capillary: 168 mg/dL — ABNORMAL HIGH (ref 70–99)

## 2011-03-25 LAB — HEPARIN LEVEL (UNFRACTIONATED): Heparin Unfractionated: 0.41 IU/mL (ref 0.30–0.70)

## 2011-03-25 MED ORDER — ACETAMINOPHEN 325 MG PO TABS: 650.0000 mg | ORAL_TABLET | ORAL | Status: AC | PRN

## 2011-03-25 MED ORDER — TICAGRELOR 90 MG PO TABS: 90.0000 mg | ORAL_TABLET | Freq: Two times a day (BID) | ORAL | Status: DC

## 2011-03-25 MED ORDER — ISOSORBIDE MONONITRATE ER 30 MG PO TB24: 30.0000 mg | ORAL_TABLET | Freq: Every day | ORAL | Status: DC

## 2011-03-25 NOTE — Progress Notes (Signed)
Pt. Seen and examined. Agree with the NP/PA-C note as written.  Negative nuclear. Enzymes negative. BP improved. Chest pain resolved. Agree with Brillinta, if she can afford it.  Ok for discharge today.  Chrystie Nose, MD Attending Cardiologist [facility]

## 2011-03-25 NOTE — Progress Notes (Signed)
Discharge instructions, prescriptions, and follow up appointments reviewed with patients, no questions or concerns.  Discharged with nephew and volunteer services via wheelchair to [facility]. [address]

## 2011-03-25 NOTE — Progress Notes (Signed)
ANTICOAGULATION CONSULT NOTE - Follow Up Consult  Pharmacy Consult for heparin Indication: chest pain/ACS  No Known Allergies  Patient Measurements: Height: 5\' 3"  (160 cm) Weight: 170 lb (77.111 kg) IBW/kg (Calculated) : 52.4  Adjusted Body Weight for heparin dosing: 69kg  Vital Signs: Temp: 97.5 F (36.4 C) (12/21 0636) Temp src: Oral (12/21 0636) BP: 114/75 mmHg (12/21 0636) Pulse Rate: 62  (12/21 0636)  Labs:  Basename 03/25/11 1610 03/24/11 0635 03/23/11 1200 03/23/11 0840 03/23/11 0618 03/22/11 2352  HGB 12.4 12.4 -- -- -- --  HCT 37.3 37.2 -- -- 40.4 --  PLT 194 190 -- -- 208 --  APTT -- -- -- -- -- --  LABPROT -- -- -- -- -- --  INR -- -- -- -- -- --  HEPARINUNFRC 0.41 0.48 -- -- 0.42 --  CREATININE 1.60* 1.60* -- -- 1.57* --  CKTOTAL -- -- 66 66 -- 70  CKMB -- -- 2.2 2.0 -- 2.2  TROPONINI -- -- <0.30 <0.30 -- <0.30   Estimated Creatinine Clearance: 27.6 ml/min (by C-G formula based on Cr of 1.6).    Assessment: Patient is an 77 y.o F on heparin for CP.  Heparin level therapeutic. No bleeding noted.  Cardiac work-up negative.  Goal of Therapy:  Heparin level 0.3-0.7 units/ml   Plan:  1) Continue current heparin drip at 850 units/hr.  2) Consider change full-dose heparin to DVT px dose if not discharged today  Elson Clan 03/25/2011,8:31 AM

## 2011-03-25 NOTE — Progress Notes (Signed)
CARE MANAGEMENT NOTE HEART FAILURE  03/25/2011   Patient:  [name], [name]   Account Number:  [id number]    Date Initiated:  03/23/2011  Documentation initiated by:  Shannan Harper  Subjective/Objective Assessment:   Patient admitted with CP and extensive cardiac hx including CAD and CHF.  She will be worked up for a rule out MI.   Action/Plan:   Patient lives alone at home with [facility] providing both an Charity fundraiser and Aide.   Anticipated DC Date:  03/24/2011  Anticipated DC Plan:  HOME W HOME HEALTH SERVICES  DC Planning Services:  CM consult    [facility] Choice:  HOME HEALTH   Choice offered to / List presented to:  C-1 Patient    HH arranged:  HH-1 RN  HH-10 DISEASE MANAGEMENT  HH-4 NURSE'S AIDE     HH agency:  [facility]    Status of service:  Completed, signed off  Medicare Important Message Given:  NA - LOS <3 / Initial given by admissions (If response is "NO", the following Medicare IM given date fields will be blank) Date Medicare IM Given:   Date Additional Medicare IM Given:    Discharge Disposition:  HOME W HOME HEALTH SERVICES  Per UR Regulation:  Reviewed for med. necessity/level of care/duration of stay  Comments:   Received referral for Brilinta start yesterday.  Benefits check came back this morning and it will be around $6.50 for the script.  [facility] on [address] was called and they have the drug for her at discharge.  I did not supply her with a copayment card as she said she can easily afford the $6.50 through her secondary with Medicaid.  Anibal Henderson with [facility] was able to visit with her yesterday after the initial referral.  Will fax discharge summary to [facility] today.  Will continue to assist as needed. 03/25/11 1021 Shannan Harper, RN, BSN   [employer] and spoke with Burna Mortimer concerning resumption of services at discharge.  Once discharge summary has been dictated will fax to agency.  Went back to speak with  patient, however she was down for her non stress test.  Scale left on her bed.  Sent benefits check for Brilinta 90 mg bid.  Awaiting copayment.  Will attempt to get back to patient prior to the end of the day for pharmacy choice.  Will continue to assist as needed.   UR Completed. 03/23/11 1149 Shannan Harper, RN, BSN  Met with patient today and she wants to contine with [facility] on Discharge.  She will need a scale which will be provided prior to discharge.  Was given a heart failure packet and discussed the education extensively with her in regars to diet, salt, weight, zones, and diagnosis.  She stated she had never been told she had CHF.  It was apparent she knew her heart was not strong but didn't know the word for the disease process. Called [facility] to notify them of the patient's current admission although patient stated they were aware.  Will send discharge summary and resumption of services at discharge to agency.  Message left as office was closed.  Will continue to monitor. 03/23/11 1641 Shannan Harper, RN, BSN   Initial CM contact:  03/23/2011 12:00 M  By:  Shannan Harper Initial CSW contact:     By:      Is this an INP Readmission < 30 days:  N (If "YES" please see  readmission information at the bottom of note)  Patient living status prior to this admission:  ALONE  Patient setting prior to this admission:  HOME  Comorbid conditions being treated that contributed to this admission:  HIGH-CHF, CAD, DM, HTN, TIA  CHF Readmission Risk:  high  Type of patient education provided  HF Patient Education Assessment / Teach Back  HF Zone Tool / Magnet  Limit salt intake  Weigh daily     Patient education provided by  [facility]    Was referral made to [facility]:  Y  Is the patient's PCP the same as attending:   PCP:    Readmission < 30 Days If pt has HH, did they contact the agency before going to the ED:   Name of [facility] agency:    Was the follow-up  physician visit scheduled prior to discharge:    Did the patient follow-up with the physician prior to this readmission:    Was there HF Clinic visits prior to readmission:    Were there ED visits between admissions:    Readmit type:    If unscheduled and related indicate reason for readmit:

## 2011-03-25 NOTE — Discharge Summary (Signed)
Physician Discharge Summary  Patient ID: [name] MRN: [id number] DOB/AGE: [birth year] 77 y.o.  Admit date: 03/22/2011 Discharge date: 03/25/2011  Discharge Diagnoses:  Principal Problem:  *Chest pain negative MI medication adjustments. Active Problems:  CHF (congestive heart failure) chronic and stable  Coronary artery disease  Hypertension  Hypothyroidism  Hyperlipidemia  Pacemaker  Insulin dependent diabetes mellitus CKD stage #3  Discharged Condition: good  Hospital Course:  77 year old white female with past medical history coronary disease with stent in 2004 to the RCA,in March of 2011 she had to progress drug-eluting stents placed to the proximal and mid RCA, and in June 2012 she had a non-ST elevation MI cardiac cath revealing totally occluded RCA with 2 additional primus stent placed in the distal RCA. She was also significantly bradycardic and had a permanent pacemaker Medtronic adaptive placed.  She presented to the emergency room at [facility] 03/22/2011 secondary to chest pain which she developed while out shopping. Describes the symptoms as substernal chest pain after shopping, a squeezing pain that radiated to her left jaw and was similar to cardiac pain she had in the past. She did take a nitroglycerin sublingual without relief but after the second nitroglycerin and another 10 minutes the pain was completely relieved. Additional symptoms as more shortness of breath recently. Cardiac enzymes were negative the patient was admitted for  Further evaluation. Her BNP was low on admission at 127. Her diuretic was held during the hospitalization.  The next morning cardiac enzymes were negative and she was arranged to have Lexiscan myoview.  The Myoview was negative for ischemia and EF was found to be 65%.   Patient is stable on the day of discharge we will decrease her Lasix to 20 mg daily down from the 40 mg daily she'll need to watch her diet closely.  Her diabetes  remained stable during hospitalization as well.   She'll remain on low-sodium heart healthy diabetic diet and she'll follow up with Dr. Allyson Sabal as instructed.  Imdur was also added to her medical regimen as her pain was relieved with nitroglycerin paste in the hospital.  Patient will be discharged home and will resume home health care with [facility].       Consults: none  Significant Diagnostic Studies:  Cardiac enzymes were negative with CK 70 MB 2.2 troponin I less than 0.30x3 Chemistry at discharge sodium 144 potassium 4.7 chloride 113 BUN 38 creatinine 1.60 calcium 9.1 glucose 134  Hemoglobin 12.4 hematocrit 37.2 to be seen on plane and platelets 190.  Renal ultrasound with no hydronephrosis and tiny right renal cyst with bilateral cortical thinning. Chest x-ray 2 view with no active disease. Urinalysis with small amount of leukocytes. TSH 2.420. Hemoglobin A1c 7.0. Platelet function P2E1 12 was 237, Plavix was changed to Brilinta.  Discharge Exam: Blood pressure 114/75, pulse 62, temperature 97.5 F (36.4 C), temperature source Oral, resp. rate 18, height 5\' 3"  (1.6 m), weight 77.111 kg (170 lb), SpO2 96.00%. PE: General: Alert, oriented, pleasant affect  Heart: S1S2 RRR, no obvious murmur  Lungs: occ rhonchi  Abd: soft, non-tender, + bs  Ext: no edema        Disposition: Home   Current Discharge Medication List    START taking these medications   Details  acetaminophen (TYLENOL) 325 MG tablet Take 2 tablets (650 mg total) by mouth every 4 (four) hours as needed. Qty: 30 tablet    isosorbide mononitrate (IMDUR) 30 MG 24 hr tablet Take 1 tablet (30  mg total) by mouth daily. Qty: 30 tablet, Refills: 11    Ticagrelor (BRILINTA) 90 MG TABS tablet Take 1 tablet (90 mg total) by mouth 2 (two) times daily. Qty: 60 tablet, Refills: 11      CONTINUE these medications which have NOT CHANGED   Details  amLODipine (NORVASC) 5 MG tablet Take 2.5 mg by mouth daily.       aspirin 81 MG chewable tablet Chew 81 mg by mouth daily.      ezetimibe-simvastatin (VYTORIN) 10-20 MG per tablet Take 1 tablet by mouth at bedtime.      furosemide (LASIX) 40 MG tablet Take 40 mg by mouth daily.      gabapentin (NEURONTIN) 300 MG capsule Take 300 mg by mouth 3 (three) times daily.      levothyroxine (SYNTHROID, LEVOTHROID) 25 MCG tablet Take 25 mcg by mouth daily.      metoprolol tartrate (LOPRESSOR) 25 MG tablet Take 25 mg by mouth 2 (two) times daily.      Multiple Vitamins-Minerals (ICAPS PO) Take 1 tablet by mouth daily.      nitroGLYCERIN (NITROSTAT) 0.4 MG SL tablet Place 0.4 mg under the tongue every 5 (five) minutes as needed. Chest pain     oxybutynin (DITROPAN-XL) 10 MG 24 hr tablet Take 5 mg by mouth daily.      !! insulin NPH (HUMULIN N,NOVOLIN N) 100 UNIT/ML injection Inject 20 Units into the skin at bedtime.      !! insulin NPH (HUMULIN N,NOVOLIN N) 100 UNIT/ML injection Inject 54 Units into the skin daily before breakfast.       !! - Potential duplicate medications found. Please discuss with provider.    STOP taking these medications     clopidogrel (PLAVIX) 75 MG tablet        Follow-up Information    Follow up with Runell Gess, MD on 04/07/2011. (@ 3:45pm)    Contact information:   [address] [place] [address] [phone]          Signed: Leone Brand 03/25/2011, 11:34 AM

## 2011-03-25 NOTE — Progress Notes (Signed)
Subjective: No complaints, no chest pain, no SOB, no bowel complaints.  Objective: Vital signs in last 24 hours: Temp:  [97.4 F (36.3 C)-98 F (36.7 C)] 97.5 F (36.4 C) (12/21 0636) Pulse Rate:  [61-74] 62  (12/21 0636) Resp:  [18-19] 18  (12/21 0636) BP: (108-138)/(49-75) 114/75 mmHg (12/21 0636) SpO2:  [95 %-97 %] 96 % (12/21 0636) Weight change:  Last BM Date: 03/23/11 Intake/Output from previous day: +74.2 12/20 0701 - 12/21 0700 In: 360 [P.O.:360] Out: 601 [Urine:600; Stool:1] Intake/Output this shift:    PE: General: Alert, oriented, pleasant affect Heart: S1S2 RRR, no obvious murmur Lungs: occ rhonchi Abd: soft, non-tender, + bs Ext: no edema  Neg. Lexiscan myoview.  A pacing    Lab Results:  [facility] 03/25/11 0635 03/24/11 0635  WBC 9.1 9.9  HGB 12.4 12.4  HCT 37.3 37.2  PLT 194 190   BMET  Basename 03/25/11 0635 03/24/11 0635  NA 142 144  K 4.3 4.7  CL 111 113*  CO2 21 22  GLUCOSE 176* 134*  BUN 36* 38*  CREATININE 1.60* 1.60*  CALCIUM 9.5 9.1    Basename 03/23/11 1200 03/23/11 0840  TROPONINI <0.30 <0.30    Lab Results  Component Value Date   CHOL 128 03/23/2011   HDL 59 03/23/2011   LDLCALC 44 03/23/2011   TRIG 126 03/23/2011   CHOLHDL 2.2 03/23/2011   Lab Results  Component Value Date   HGBA1C 7.0* 03/22/2011     Lab Results  Component Value Date   TSH 2.420 03/22/2011    Hepatic Function Panel  Basename 03/22/11 2330  PROT 6.8  ALBUMIN 3.4*  AST 15  ALT 16  ALKPHOS 101  BILITOT 0.6  BILIDIR --  IBILI --    Basename 03/23/11 0618  CHOL 128   No results found for this basename: PROTIME in the last 72 hours    EKG: Orders placed during the hospital encounter of 03/22/11  . EKG 12-LEAD  . EKG 12-LEAD  . EKG 12-LEAD  . EKG  . EKG  . EKG 12-LEAD    Studies/Results: US Renal  03/23/2011  *RADIOLOGY REPORT*  Clinical Data: Elevated creatinine  RENAL / URINARY TRACT ULTRASOUND  Technique:  Complete  ultrasound exam of the kidneys and urinary bladder was performed.  Comparison: No comparison studies available.  Findings:  The right kidney measures the 9.4 cm in long axis.  The left kidney measures 10.8 cm.  10 mm cyst is identified in the interpolar region of the right kidney.  Both kidneys show renal cortical thinning.  Renal cortical echogenicity is within normal limits. There is no hydronephrosis in either kidney.  Midline imaging in the pelvis reveals a normal appearing bladder.  Impression:  No evidence for hydronephrosis.  Tiny right renal cyst with bilateral renal cortical thinning.  Original Report Authenticated By: ERIC A. MANSELL, M.D.   Nm Myocar Multi W/spect W/wall Motion / Ef  03/24/2011  *RADIOLOGY REPORT*  Clinical Data:  Chest pain.  Coronary artery disease.  MYOCARDIAL IMAGING WITH SPECT (REST AND PHARMACOLOGIC-STRESS) GATED LEFT VENTRICULAR WALL MOTION STUDY LEFT VENTRICULAR EJECTION FRACTION  Technique:  Standard myocardial SPECT imaging was performed after resting intravenous injection of 10 mCi Tc-34m Myoview. Subsequently, intravenous infusion of Lexiscan was performed under the supervision of the Cardiology staff.  At peak effect of the drug, 30 mCi Tc-37m Myoview was injected intravenously and standard myocardial SPECT imaging was performed.  Quantitative gated imaging was also performed to evaluate left ventricular  wall motion and estimate left ventricular ejection fraction.  Comparison:  Chest radiograph 03/22/2011.  Findings:  Mild dilation of the left ventricle is present.  Wall motion is normal.  Diaphragmatic attenuation is present over the inferior wall.  There are no reversible perfusion defects.  End- diastolic volume 65 ml.  End-systolic volume is 22 ml.  Calculated ejection fraction of 66%.  IMPRESSION: 1.  No reversible defects. 2.  Calculated ejection cuff foot ejection fraction of 66%.  Original Report Authenticated By: Andreas Newport, M.D.    Medications: I have  reviewed the patient's current medications.  Assessment/Plan: Patient Active Problem List  Diagnoses  . CHF (congestive heart failure)  . Coronary artery disease  . Hypertension  . Hypothyroidism  . Hyperlipidemia  . TIA (transient ischemic attack)  . Pacemaker  . Insulin dependent diabetes mellitus  . Chest pain  CKD stage 3-4   PLAN:  No MI, neg. Stress test, NO CHF this admit with low BNP.  Dyspnea may be related to anxiety associated with angina. HTN well controlled.  Stable ready for d/c home on Brilinta instead of Plavix.  LOS: 3 days   [name] 03/25/2011, 8:16 AM

## 2011-03-25 NOTE — Discharge Summary (Signed)
Natasha Brightbill C. Irie Dowson, MD Attending Cardiologist [facility]  

## 2011-03-26 LAB — URINE CULTURE
Colony Count: >=100000
Culture  Setup Time: 201212210910

## 2011-09-03 ENCOUNTER — Emergency Department (HOSPITAL_COMMUNITY)
Admission: EM | Admit: 2011-09-03 | Discharge: 2011-09-04 | Disposition: A | Discharge status: Home / Self Care | Payer: Medicare Other | Attending: Emergency Medicine | Admitting: Emergency Medicine

## 2011-09-03 ENCOUNTER — Emergency Department (HOSPITAL_COMMUNITY): Payer: Medicare Other

## 2011-09-03 ENCOUNTER — Encounter (HOSPITAL_COMMUNITY): Payer: Self-pay | Admitting: Emergency Medicine

## 2011-09-03 DIAGNOSIS — I2510 Atherosclerotic heart disease of native coronary artery without angina pectoris: Secondary | ICD-10-CM | POA: Insufficient documentation

## 2011-09-03 DIAGNOSIS — M5030 Other cervical disc degeneration, unspecified cervical region: Secondary | ICD-10-CM | POA: Insufficient documentation

## 2011-09-03 DIAGNOSIS — T1490XA Injury, unspecified, initial encounter: Secondary | ICD-10-CM | POA: Insufficient documentation

## 2011-09-03 DIAGNOSIS — IMO0002 Reserved for concepts with insufficient information to code with codable children: Secondary | ICD-10-CM | POA: Insufficient documentation

## 2011-09-03 DIAGNOSIS — I6789 Other cerebrovascular disease: Secondary | ICD-10-CM | POA: Insufficient documentation

## 2011-09-03 DIAGNOSIS — Z950 Presence of cardiac pacemaker: Secondary | ICD-10-CM | POA: Insufficient documentation

## 2011-09-03 DIAGNOSIS — Z794 Long term (current) use of insulin: Secondary | ICD-10-CM | POA: Insufficient documentation

## 2011-09-03 DIAGNOSIS — Z79899 Other long term (current) drug therapy: Secondary | ICD-10-CM | POA: Insufficient documentation

## 2011-09-03 DIAGNOSIS — W19XXXA Unspecified fall, initial encounter: Secondary | ICD-10-CM | POA: Insufficient documentation

## 2011-09-03 DIAGNOSIS — S1093XA Contusion of unspecified part of neck, initial encounter: Secondary | ICD-10-CM | POA: Insufficient documentation

## 2011-09-03 DIAGNOSIS — I10 Essential (primary) hypertension: Secondary | ICD-10-CM | POA: Insufficient documentation

## 2011-09-03 DIAGNOSIS — E119 Type 2 diabetes mellitus without complications: Secondary | ICD-10-CM | POA: Insufficient documentation

## 2011-09-03 DIAGNOSIS — S0003XA Contusion of scalp, initial encounter: Secondary | ICD-10-CM | POA: Insufficient documentation

## 2011-09-03 MED ORDER — HYDROCODONE-ACETAMINOPHEN 5-325 MG PO TABS: 1.0000 | ORAL_TABLET | Freq: Four times a day (QID) | ORAL | Status: AC | PRN

## 2011-09-03 MED ORDER — HYDROCODONE-ACETAMINOPHEN 5-325 MG PO TABS: 1.0000 | ORAL_TABLET | ORAL | Status: DC

## 2011-09-03 NOTE — ED Notes (Signed)
Friend and niece have been contacted to take pt home but no answer.Pt ambulates to bathroom with assistance and normally uses walker at home.Pt remains alert and oriented.

## 2011-09-03 NOTE — ED Notes (Signed)
Patient  brought to ED by EMS with history of fall.She is normaly on plavix .No active bleeding.Haematoma seen in the left forehead and scratch marks on both knees.

## 2011-09-03 NOTE — ED Notes (Signed)
Spoke to pt niece about pt being discharged. Niece or family member will come get pt and take pt home. Pt aware and report given to [place] RN in CDU.

## 2011-09-03 NOTE — ED Provider Notes (Signed)
History     CSN: [id number]  Arrival date & time 09/03/11  2052   None     Chief Complaint  Patient presents with  . Fall    (Consider location/radiation/quality/duration/timing/severity/associated sxs/prior treatment) Patient is a 78 y.o. female presenting with fall. The history is provided by the patient.  Fall The accident occurred less than 1 hour ago. The fall occurred while walking. Distance fallen: standing. She landed on concrete. The volume of blood lost was minimal. The point of impact was the head. The pain is present in the head. The pain is mild. She was not ambulatory at the scene. There was no entrapment after the fall. There was no drug use involved in the accident. There was no alcohol use involved in the accident. Pertinent negatives include no fever, no abdominal pain, no nausea, no vomiting, no hematuria and no headaches. The symptoms are aggravated by activity. Treatment on scene includes a c-collar and a backboard. She has tried nothing for the symptoms. The treatment provided no relief.    Past Medical History  Diagnosis Date  . CHF (congestive heart failure)   . Coronary artery disease   . Hypertension   . Pacemaker   . Hyperlipidemia   . TIA (transient ischemic attack)   . Hypothyroidism   . Insulin dependent diabetes mellitus     Past Surgical History  Procedure Date  . Cardiac surgery 09/20/2009    Pacemaker    History reviewed. No pertinent family history.  History  Substance Use Topics  . Smoking status: Former Games developer  . Smokeless tobacco: Not on file  . Alcohol Use: No    OB History    Grav Para Term Preterm Abortions TAB SAB Ect Mult Living                  Review of Systems  Constitutional: Negative for fever and fatigue.  HENT: Negative for congestion, drooling and neck pain.   Eyes: Negative for pain.  Respiratory: Negative for cough and shortness of breath.   Cardiovascular: Negative for chest pain.  Gastrointestinal:  Negative for nausea, vomiting, abdominal pain and diarrhea.  Genitourinary: Negative for dysuria and hematuria.  Musculoskeletal: Negative for back pain and gait problem.  Skin: Negative for color change.  Neurological: Negative for dizziness and headaches.  Hematological: Negative for adenopathy.  Psychiatric/Behavioral: Negative for behavioral problems.  All other systems reviewed and are negative.    Allergies  Review of patient's allergies indicates no known allergies.  Home Medications   Current Outpatient Rx  Name Route Sig Dispense Refill  . ASPIRIN 81 MG PO CHEW Oral Chew 81 mg by mouth daily.      Marland Kitchen CLOPIDOGREL BISULFATE 75 MG PO TABS Oral Take 75 mg by mouth daily.    . FUROSEMIDE 40 MG PO TABS Oral Take 20 mg by mouth daily.     Marland Kitchen GABAPENTIN 300 MG PO CAPS Oral Take 300 mg by mouth 3 (three) times daily.      . INSULIN ISOPHANE HUMAN 100 UNIT/ML [place] SUSP Subcutaneous Inject 20-54 Units into the skin 2 (two) times daily. Takes 54 units in the morning & 20 units at night    . ISOSORBIDE MONONITRATE ER 30 MG PO TB24 Oral Take 30 mg by mouth daily.    Marland Kitchen LEVOTHYROXINE SODIUM 25 MCG PO TABS Oral Take 25 mcg by mouth daily.      Marland Kitchen METOPROLOL TARTRATE 25 MG PO TABS Oral Take 25 mg by mouth 2 (  two) times daily.      . ICAPS PO Oral Take 1 tablet by mouth daily.      Marland Kitchen NITROGLYCERIN 0.4 MG SL SUBL Sublingual Place 0.4 mg under the tongue every 5 (five) minutes as needed. Chest pain     . OXYBUTYNIN CHLORIDE ER 10 MG PO TB24 Oral Take 10 mg by mouth every other day.     Marland Kitchen SIMVASTATIN 20 MG PO TABS Oral Take 20 mg by mouth every evening.    Marland Kitchen HYDROCODONE-ACETAMINOPHEN 5-325 MG PO TABS Oral Take 1 tablet by mouth every 6 (six) hours as needed for pain. 10 tablet 0    BP 120/84  Pulse 68  Temp(Src) 97.4 F (36.3 C) (Oral)  Resp 20  SpO2 95%  Physical Exam  Constitutional: She is oriented to person, place, and time. She appears well-developed and well-nourished.  HENT:  Head:  Normocephalic.  Mouth/Throat: No oropharyngeal exudate.       Small hematoma to left forehead and several small abrasions.  Eyes: Conjunctivae and EOM are normal. Pupils are equal, round, and reactive to light.  Neck: Normal range of motion. Neck supple.  Cardiovascular: Normal rate, regular rhythm, normal heart sounds and intact distal pulses.  Exam reveals no gallop and no friction rub.   No murmur heard. Pulmonary/Chest: Effort normal and breath sounds normal. No respiratory distress. She has no wheezes.  Abdominal: Soft. Bowel sounds are normal. There is no tenderness.  Musculoskeletal: Normal range of motion. She exhibits no edema and no tenderness.       Mild bilateral knee abrasions. Pt has normal rom of LE's bilaterally.   Neurological: She is alert and oriented to person, place, and time.  Skin: Skin is warm and dry.  Psychiatric: She has a normal mood and affect. Her behavior is normal.    ED Course  Procedures (including critical care time)  Labs Reviewed - No data to display Ct Head Wo Contrast  09/03/2011  *RADIOLOGY REPORT*  Clinical Data:  History of a fall.  Hematoma on the left forehead.  CT HEAD WITHOUT CONTRAST CT CERVICAL SPINE WITHOUT CONTRAST  Technique:  Multidetector CT imaging of the head and cervical spine was performed following the standard protocol without intravenous contrast.  Multiplanar CT image reconstructions of the cervical spine were also generated.  Comparison:  CT head and cervical spine 04/21/2006.  CT neck 01/09/2008.  CT cervical spine 09/14/2006.  CT cervical spine 06/19/2006.  CT HEAD  Findings: Diffuse cerebral atrophy.  Ventricular dilatation consistent with central atrophy.  Low attenuation change in the deep white matter consistent with small vessel ischemia.  There are old lacunar infarcts in the left periventricular white matter, right cerebellum, and pons.  No mass effect or midline shift.  No abnormal extra-axial fluid collections.  Gray-white  matter junctions are distinct.  Basal cisterns are not effaced.  Small left anterior frontal subcutaneous scalp hematoma.  No underlying skull fractures.  Visualized paranasal sinuses and mastoid air cells are not opacified.  Vascular calcifications.  IMPRESSION: No acute intracranial abnormalities.  Stable appearance of old lacunar infarcts, atrophy, and small vessel disease.  CT CERVICAL SPINE  Findings: Diffuse bone demineralization.  Diffuse degenerative change throughout the cervical spine.  There is mild anterior subluxation of C6 on C7 with perched facet sac is 06/07 on the right to and C5-6 on the left.  Associated widening of the C5-6 posterior interspinous space.  Mild rotation and angulation at this level.  These changes appear stable  since the previous study. There have been interval increased degenerative changes since that time.  No acute vertebral compression fracture is demonstrated. No prevertebral soft tissue swelling.  No focal bone lesion or bone destruction.  Prominent degenerative changes are present throughout the cervical spine with prominent disc osteophyte complex and hypertrophic changes causing anterior effacement of the thecal sac at C4-5 with right greater than left neural foraminal narrowing. Prominent right neural foraminal narrowing at C5-6.  Effacement the anterior thecal sac and bilateral neural foraminal narrowing at C6- 7.  IMPRESSION: Old appearing injury with anterior subluxation of C6-7 and first facets at C5-6 on the left and C6-7 on the right.  These changes appear to be chronic and stable since the previous study with increased interval degenerative change.  Degenerative changes throughout the cervical spine as described.  No acute displaced fractures are identified.  Original Report Authenticated By: Marlon Pel, M.D.   Ct Cervical Spine Wo Contrast  09/03/2011  *RADIOLOGY REPORT*  Clinical Data:  History of a fall.  Hematoma on the left forehead.  CT HEAD WITHOUT  CONTRAST CT CERVICAL SPINE WITHOUT CONTRAST  Technique:  Multidetector CT imaging of the head and cervical spine was performed following the standard protocol without intravenous contrast.  Multiplanar CT image reconstructions of the cervical spine were also generated.  Comparison:  CT head and cervical spine 04/21/2006.  CT neck 01/09/2008.  CT cervical spine 09/14/2006.  CT cervical spine 06/19/2006.  CT HEAD  Findings: Diffuse cerebral atrophy.  Ventricular dilatation consistent with central atrophy.  Low attenuation change in the deep white matter consistent with small vessel ischemia.  There are old lacunar infarcts in the left periventricular white matter, right cerebellum, and pons.  No mass effect or midline shift.  No abnormal extra-axial fluid collections.  Gray-white matter junctions are distinct.  Basal cisterns are not effaced.  Small left anterior frontal subcutaneous scalp hematoma.  No underlying skull fractures.  Visualized paranasal sinuses and mastoid air cells are not opacified.  Vascular calcifications.  IMPRESSION: No acute intracranial abnormalities.  Stable appearance of old lacunar infarcts, atrophy, and small vessel disease.  CT CERVICAL SPINE  Findings: Diffuse bone demineralization.  Diffuse degenerative change throughout the cervical spine.  There is mild anterior subluxation of C6 on C7 with perched facet sac is 06/07 on the right to and C5-6 on the left.  Associated widening of the C5-6 posterior interspinous space.  Mild rotation and angulation at this level.  These changes appear stable since the previous study. There have been interval increased degenerative changes since that time.  No acute vertebral compression fracture is demonstrated. No prevertebral soft tissue swelling.  No focal bone lesion or bone destruction.  Prominent degenerative changes are present throughout the cervical spine with prominent disc osteophyte complex and hypertrophic changes causing anterior effacement  of the thecal sac at C4-5 with right greater than left neural foraminal narrowing. Prominent right neural foraminal narrowing at C5-6.  Effacement the anterior thecal sac and bilateral neural foraminal narrowing at C6- 7.  IMPRESSION: Old appearing injury with anterior subluxation of C6-7 and first facets at C5-6 on the left and C6-7 on the right.  These changes appear to be chronic and stable since the previous study with increased interval degenerative change.  Degenerative changes throughout the cervical spine as described.  No acute displaced fractures are identified.  Original Report Authenticated By: Marlon Pel, M.D.     1. Fall from standing     Date:  09/04/2011  Rate: 78  Rhythm: normal sinus rhythm  QRS Axis: normal  Intervals: normal  ST/T Wave abnormalities: normal  Conduction Disutrbances:none  Narrative Interpretation: No ST or T wave changes cw ischemia  Old EKG Reviewed: unchanged     MDM  12:26 AM 78 y.o. female pw mechanical fall while getting out of her car. Pt fell from standing onto cement hitting her head. Pt was down for approx prior to EMS arrival. Pt AFVSS here, A/O x3, appears well. Will get CT head/neck.    12:26 AM: Imaging non-contrib. Pt is ambulatory. Will d/c home. I have discussed the diagnosis/risks/treatment options with the patient and believe the pt to be eligible for discharge home to follow-up with pcp as needed. We also discussed returning to the ED immediately if new or worsening sx occur. We discussed the sx which are most concerning (e.g., worsening pain) that necessitate immediate return. Any new prescriptions provided to the patient are listed below.  New Prescriptions   HYDROCODONE-ACETAMINOPHEN (NORCO) 5-325 MG PER TABLET    Take 1 tablet by mouth every 6 (six) hours as needed for pain.   Clinical Impression 1. Fall from standing      Purvis Sheffield, MD 09/04/11 1610  Purvis Sheffield, MD 09/04/11 [id number]

## 2011-09-03 NOTE — ED Notes (Signed)
Pt put on C collor.

## 2011-09-03 NOTE — ED Notes (Signed)
Forehead abrassion cleaned by nurse tech

## 2011-09-04 NOTE — ED Provider Notes (Signed)
I saw and evaluated the patient, reviewed the resident's note and I agree with the findings and plan. Pt with a mechanical fall and facial trauma.  Is on antioagulation but no LOC.  Pt is well appearing and scans are wnl.  Pt is able to ambulate in the dept without difficulty and d/ced home.  Gwyneth Sprout, MD 09/04/11 1400

## 2011-11-11 ENCOUNTER — Other Ambulatory Visit: Payer: Self-pay | Admitting: Dermatology

## 2012-02-15 ENCOUNTER — Encounter (HOSPITAL_COMMUNITY): Payer: Self-pay | Admitting: Emergency Medicine

## 2012-02-15 ENCOUNTER — Emergency Department (HOSPITAL_COMMUNITY): Payer: Medicare Other

## 2012-02-15 ENCOUNTER — Inpatient Hospital Stay (HOSPITAL_COMMUNITY)
Admission: EM | Admit: 2012-02-15 | Discharge: 2012-02-17 | DRG: 638 | Disposition: A | Discharge status: Home Health | Payer: Medicare Other | Attending: Family Medicine | Admitting: Family Medicine

## 2012-02-15 DIAGNOSIS — I10 Essential (primary) hypertension: Secondary | ICD-10-CM

## 2012-02-15 DIAGNOSIS — G319 Degenerative disease of nervous system, unspecified: Secondary | ICD-10-CM | POA: Diagnosis present

## 2012-02-15 DIAGNOSIS — E039 Hypothyroidism, unspecified: Secondary | ICD-10-CM

## 2012-02-15 DIAGNOSIS — G459 Transient cerebral ischemic attack, unspecified: Secondary | ICD-10-CM

## 2012-02-15 DIAGNOSIS — E785 Hyperlipidemia, unspecified: Secondary | ICD-10-CM

## 2012-02-15 DIAGNOSIS — I5032 Chronic diastolic (congestive) heart failure: Secondary | ICD-10-CM | POA: Diagnosis present

## 2012-02-15 DIAGNOSIS — Z950 Presence of cardiac pacemaker: Secondary | ICD-10-CM

## 2012-02-15 DIAGNOSIS — J4489 Other specified chronic obstructive pulmonary disease: Secondary | ICD-10-CM | POA: Diagnosis present

## 2012-02-15 DIAGNOSIS — IMO0001 Reserved for inherently not codable concepts without codable children: Secondary | ICD-10-CM

## 2012-02-15 DIAGNOSIS — J449 Chronic obstructive pulmonary disease, unspecified: Secondary | ICD-10-CM | POA: Diagnosis present

## 2012-02-15 DIAGNOSIS — Z8673 Personal history of transient ischemic attack (TIA), and cerebral infarction without residual deficits: Secondary | ICD-10-CM

## 2012-02-15 DIAGNOSIS — Z91199 Patient's noncompliance with other medical treatment and regimen due to unspecified reason: Secondary | ICD-10-CM

## 2012-02-15 DIAGNOSIS — Z79899 Other long term (current) drug therapy: Secondary | ICD-10-CM

## 2012-02-15 DIAGNOSIS — E871 Hypo-osmolality and hyponatremia: Secondary | ICD-10-CM | POA: Diagnosis present

## 2012-02-15 DIAGNOSIS — Z794 Long term (current) use of insulin: Secondary | ICD-10-CM

## 2012-02-15 DIAGNOSIS — Z9861 Coronary angioplasty status: Secondary | ICD-10-CM

## 2012-02-15 DIAGNOSIS — I509 Heart failure, unspecified: Secondary | ICD-10-CM

## 2012-02-15 DIAGNOSIS — N179 Acute kidney failure, unspecified: Secondary | ICD-10-CM | POA: Diagnosis present

## 2012-02-15 DIAGNOSIS — E869 Volume depletion, unspecified: Secondary | ICD-10-CM | POA: Diagnosis present

## 2012-02-15 DIAGNOSIS — N182 Chronic kidney disease, stage 2 (mild): Secondary | ICD-10-CM | POA: Diagnosis present

## 2012-02-15 DIAGNOSIS — Z9181 History of falling: Secondary | ICD-10-CM

## 2012-02-15 DIAGNOSIS — I129 Hypertensive chronic kidney disease with stage 1 through stage 4 chronic kidney disease, or unspecified chronic kidney disease: Secondary | ICD-10-CM | POA: Diagnosis present

## 2012-02-15 DIAGNOSIS — I2510 Atherosclerotic heart disease of native coronary artery without angina pectoris: Secondary | ICD-10-CM

## 2012-02-15 DIAGNOSIS — E875 Hyperkalemia: Secondary | ICD-10-CM | POA: Diagnosis present

## 2012-02-15 DIAGNOSIS — E1101 Type 2 diabetes mellitus with hyperosmolarity with coma: Principal | ICD-10-CM

## 2012-02-15 DIAGNOSIS — Z7982 Long term (current) use of aspirin: Secondary | ICD-10-CM

## 2012-02-15 DIAGNOSIS — I252 Old myocardial infarction: Secondary | ICD-10-CM

## 2012-02-15 DIAGNOSIS — Z9119 Patient's noncompliance with other medical treatment and regimen: Secondary | ICD-10-CM

## 2012-02-15 DIAGNOSIS — Z87891 Personal history of nicotine dependence: Secondary | ICD-10-CM

## 2012-02-15 HISTORY — DX: Cerebral infarction, unspecified: I63.9

## 2012-02-15 LAB — URINALYSIS, ROUTINE W REFLEX MICROSCOPIC
Ketones, ur: NEGATIVE mg/dL
Nitrite: NEGATIVE
Specific Gravity, Urine: 1.021 (ref 1.005–1.030)
Urobilinogen, UA: 0.2 mg/dL (ref 0.0–1.0)
pH: 5.5 (ref 5.0–8.0)

## 2012-02-15 LAB — BASIC METABOLIC PANEL
BUN: 38 mg/dL — ABNORMAL HIGH (ref 6–23)
CO2: 24 mEq/L (ref 19–32)
CO2: 23 mEq/L (ref 19–32)
Calcium: 9.6 mg/dL (ref 8.4–10.5)
Chloride: 104 mEq/L (ref 96–112)
Creatinine, Ser: 1.8 mg/dL — ABNORMAL HIGH (ref 0.50–1.10)
GFR calc Af Amer: 30 mL/min — ABNORMAL LOW (ref >90)
GFR calc non Af Amer: 25 mL/min — ABNORMAL LOW (ref >90)
Glucose, Bld: 337 mg/dL — ABNORMAL HIGH (ref 70–99)
Potassium: 4.5 mEq/L (ref 3.5–5.1)
Sodium: 137 mEq/L (ref 135–145)

## 2012-02-15 LAB — URINE MICROSCOPIC-ADD ON

## 2012-02-15 LAB — GLUCOSE, CAPILLARY
Glucose-Capillary: 517 mg/dL — ABNORMAL HIGH (ref 70–99)
Glucose-Capillary: 358 mg/dL — ABNORMAL HIGH (ref 70–99)
Glucose-Capillary: 263 mg/dL — ABNORMAL HIGH (ref 70–99)

## 2012-02-15 LAB — COMPREHENSIVE METABOLIC PANEL
ALT: 16 U/L (ref 0–35)
AST: 19 U/L (ref 0–37)
CO2: 25 mEq/L (ref 19–32)
Chloride: 98 mEq/L (ref 96–112)
GFR calc non Af Amer: 24 mL/min — ABNORMAL LOW (ref >90)
Sodium: 133 mEq/L — ABNORMAL LOW (ref 135–145)
Total Bilirubin: 1.1 mg/dL (ref 0.3–1.2)

## 2012-02-15 LAB — CBC WITH DIFFERENTIAL/PLATELET
Basophils Absolute: 0.1 10*3/uL (ref 0.0–0.1)
Basophils Relative: 1 % (ref 0–1)
Eosinophils Absolute: 0.2 10*3/uL (ref 0.0–0.7)
Hemoglobin: 14.2 g/dL (ref 12.0–15.0)
MCHC: 34.1 g/dL (ref 30.0–36.0)
Monocytes Relative: 7 % (ref 3–12)
Neutro Abs: 8.3 10*3/uL — ABNORMAL HIGH (ref 1.7–7.7)
Neutrophils Relative %: 69 % (ref 43–77)
Platelets: 151 10*3/uL (ref 150–400)

## 2012-02-15 LAB — APTT: aPTT: 31 seconds (ref 24–37)

## 2012-02-15 LAB — TYPE AND SCREEN: Antibody Screen: NEGATIVE

## 2012-02-15 LAB — POCT I-STAT TROPONIN I

## 2012-02-15 LAB — CK TOTAL AND CKMB (NOT AT ARMC): Total CK: 171 U/L (ref 7–177)

## 2012-02-15 LAB — TROPONIN I: Troponin I: <0.3 ng/mL (ref <0.30)

## 2012-02-15 MED ORDER — SODIUM CHLORIDE 0.9 % IV BOLUS (SEPSIS)
1000.0000 mL | Freq: Once | INTRAVENOUS | Status: AC
  Administered 2012-02-15: 1000 mL via INTRAVENOUS

## 2012-02-15 MED ORDER — METOPROLOL TARTRATE 25 MG PO TABS
25.0000 mg | ORAL_TABLET | Freq: Two times a day (BID) | ORAL | Status: DC
  Administered 2012-02-15 – 2012-02-17 (×4): 25 mg via ORAL
  Filled 2012-02-15 (×5): qty 1

## 2012-02-15 MED ORDER — SODIUM CHLORIDE 0.9 % IV SOLN
INTRAVENOUS | Status: DC
  Filled 2012-02-15: qty 1

## 2012-02-15 MED ORDER — SODIUM CHLORIDE 0.9 % IV BOLUS (SEPSIS)
500.0000 mL | Freq: Once | INTRAVENOUS | Status: AC
  Administered 2012-02-15: 500 mL via INTRAVENOUS

## 2012-02-15 MED ORDER — INSULIN GLARGINE 100 UNIT/ML ~~LOC~~ SOLN
10.0000 [IU] | Freq: Every day | SUBCUTANEOUS | Status: DC
  Administered 2012-02-15 – 2012-02-16 (×2): 10 [IU] via SUBCUTANEOUS

## 2012-02-15 MED ORDER — INSULIN ASPART 100 UNIT/ML ~~LOC~~ SOLN
0.0000 [IU] | Freq: Three times a day (TID) | SUBCUTANEOUS | Status: DC
  Administered 2012-02-15: 3 [IU] via SUBCUTANEOUS
  Administered 2012-02-16 (×2): 2 [IU] via SUBCUTANEOUS
  Administered 2012-02-16 – 2012-02-17 (×3): 3 [IU] via SUBCUTANEOUS

## 2012-02-15 MED ORDER — SODIUM CHLORIDE 0.9 % IV SOLN
INTRAVENOUS | Status: DC
  Administered 2012-02-15: 125 mL/h via INTRAVENOUS

## 2012-02-15 MED ORDER — HEPARIN SODIUM (PORCINE) 5000 UNIT/ML IJ SOLN
5000.0000 [IU] | Freq: Three times a day (TID) | INTRAMUSCULAR | Status: DC
  Administered 2012-02-15 – 2012-02-17 (×5): 5000 [IU] via SUBCUTANEOUS
  Filled 2012-02-15 (×8): qty 1

## 2012-02-15 MED ORDER — INSULIN ASPART 100 UNIT/ML ~~LOC~~ SOLN
4.0000 [IU] | Freq: Three times a day (TID) | SUBCUTANEOUS | Status: DC
Start: 2012-02-15 — End: 2012-02-17
  Administered 2012-02-15 – 2012-02-17 (×6): 4 [IU] via SUBCUTANEOUS

## 2012-02-15 MED ORDER — INSULIN ASPART 100 UNIT/ML ~~LOC~~ SOLN
10.0000 [IU] | Freq: Once | SUBCUTANEOUS | Status: AC
  Administered 2012-02-15: 10 [IU] via INTRAVENOUS
  Filled 2012-02-15: qty 1

## 2012-02-15 MED ORDER — ISOSORBIDE MONONITRATE 15 MG HALF TABLET
15.0000 mg | ORAL_TABLET | Freq: Every day | ORAL | Status: DC
  Administered 2012-02-15 – 2012-02-17 (×3): 15 mg via ORAL
  Filled 2012-02-15 (×3): qty 1

## 2012-02-15 MED ORDER — SODIUM CHLORIDE 0.9 % IV SOLN
1000.0000 mL | INTRAVENOUS | Status: DC
  Administered 2012-02-15: 1000 mL via INTRAVENOUS

## 2012-02-15 MED ORDER — SODIUM BICARBONATE 8.4 % IV SOLN
50.0000 meq | Freq: Once | INTRAVENOUS | Status: DC
  Filled 2012-02-15: qty 50

## 2012-02-15 MED ORDER — ASPIRIN 81 MG PO CHEW
81.0000 mg | CHEWABLE_TABLET | Freq: Every day | ORAL | Status: DC
  Administered 2012-02-16 – 2012-02-17 (×2): 81 mg via ORAL
  Filled 2012-02-15 (×3): qty 1

## 2012-02-15 MED ORDER — LEVOTHYROXINE SODIUM 25 MCG PO TABS
25.0000 ug | ORAL_TABLET | Freq: Every day | ORAL | Status: DC
  Administered 2012-02-17: 25 ug via ORAL
  Filled 2012-02-15 (×3): qty 1

## 2012-02-15 MED ORDER — CLOPIDOGREL BISULFATE 75 MG PO TABS
75.0000 mg | ORAL_TABLET | Freq: Every day | ORAL | Status: DC
  Administered 2012-02-16 – 2012-02-17 (×2): 75 mg via ORAL
  Filled 2012-02-15 (×3): qty 1

## 2012-02-15 NOTE — ED Notes (Signed)
Pt transported back to room from being out of the department by radiology tech; pt placed on monitor, continuous pulse oximetry and blood pressure cuff

## 2012-02-15 NOTE — ED Provider Notes (Addendum)
Care assumed at sign out. This is a 78 yo F hx of DM here with weakness and inability to get off the commode. Was found to be hyperglycemic by EMS. Here initial FS was 453. No AG on CMP but K 5.9 with no EKG changes and Cr mildly elevated compared to baseline. She was given NS 1L bolus, insulin, bicarb. I discussed with hospitalist, who will admit the patient to step down. Concerned for possible hyperosmolar state.   CRITICAL CARE Performed by: Silverio Lay, DAVID   Total critical care time: 30 minutes   Critical care time was exclusive of separately billable procedures and treating other patients.  Critical care was necessary to treat or prevent imminent or life-threatening deterioration.  Critical care was time spent personally by me on the following activities: development of treatment plan with patient and/or surrogate as well as nursing, discussions with consultants, evaluation of patient's response to treatment, examination of patient, obtaining history from patient or surrogate, ordering and performing treatments and interventions, ordering and review of laboratory studies, ordering and review of radiographic studies, pulse oximetry and re-evaluation of patient's condition.     Results for orders placed during the hospital encounter of 02/15/12  GLUCOSE, CAPILLARY      Component Value Range   Glucose-Capillary 517 (*) 70 - 99 mg/dL  COMPREHENSIVE METABOLIC PANEL      Component Value Range   Sodium 133 (*) 135 - 145 mEq/L   Potassium 5.9 (*) 3.5 - 5.1 mEq/L   Chloride 98  96 - 112 mEq/L   CO2 25  19 - 32 mEq/L   Glucose, Bld 493 (*) 70 - 99 mg/dL   BUN 43 (*) 6 - 23 mg/dL   Creatinine, Ser 8.29 (*) 0.50 - 1.10 mg/dL   Calcium 9.9  8.4 - 56.2 mg/dL   Total Protein 7.0  6.0 - 8.3 g/dL   Albumin 3.6  3.5 - 5.2 g/dL   AST 19  0 - 37 U/L   ALT 16  0 - 35 U/L   Alkaline Phosphatase 119 (*) 39 - 117 U/L   Total Bilirubin 1.1  0.3 - 1.2 mg/dL   GFR calc non Af Amer 24 (*) >90 mL/min   GFR  calc Af Amer 28 (*) >90 mL/min  PROTIME-INR      Component Value Range   Prothrombin Time 14.4  11.6 - 15.2 seconds   INR 1.14  0.00 - 1.49  APTT      Component Value Range   aPTT 31  24 - 37 seconds  TYPE AND SCREEN      Component Value Range   ABO/RH(D) A POS     Antibody Screen NEG     Sample Expiration 02/18/2012    URINALYSIS, ROUTINE W REFLEX MICROSCOPIC      Component Value Range   Color, Urine YELLOW  YELLOW   APPearance HAZY (*) CLEAR   Specific Gravity, Urine 1.021  1.005 - 1.030   pH 5.5  5.0 - 8.0   Glucose, UA >1000 (*) NEGATIVE mg/dL   Hgb urine dipstick TRACE (*) NEGATIVE   Bilirubin Urine NEGATIVE  NEGATIVE   Ketones, ur NEGATIVE  NEGATIVE mg/dL   Protein, ur NEGATIVE  NEGATIVE mg/dL   Urobilinogen, UA 0.2  0.0 - 1.0 mg/dL   Nitrite NEGATIVE  NEGATIVE   Leukocytes, UA SMALL (*) NEGATIVE  CBC WITH DIFFERENTIAL      Component Value Range   WBC 12.1 (*) 4.0 - 10.5 K/uL  RBC 4.75  3.87 - 5.11 MIL/uL   Hemoglobin 14.2  12.0 - 15.0 g/dL   HCT 16.1  09.6 - 04.5 %   MCV 87.8  78.0 - 100.0 fL   MCH 29.9  26.0 - 34.0 pg   MCHC 34.1  30.0 - 36.0 g/dL   RDW 40.9  81.1 - 91.4 %   Platelets 151  150 - 400 K/uL   Neutrophils Relative 69  43 - 77 %   Neutro Abs 8.3 (*) 1.7 - 7.7 K/uL   Lymphocytes Relative 22  12 - 46 %   Lymphs Abs 2.7  0.7 - 4.0 K/uL   Monocytes Relative 7  3 - 12 %   Monocytes Absolute 0.8  0.1 - 1.0 K/uL   Eosinophils Relative 2  0 - 5 %   Eosinophils Absolute 0.2  0.0 - 0.7 K/uL   Basophils Relative 1  0 - 1 %   Basophils Absolute 0.1  0.0 - 0.1 K/uL  GLUCOSE, CAPILLARY      Component Value Range   Glucose-Capillary 435 (*) 70 - 99 mg/dL  URINE MICROSCOPIC-ADD ON      Component Value Range   Squamous Epithelial / LPF FEW (*) RARE   WBC, UA 11-20  <3 WBC/hpf   RBC / HPF 0-2  <3 RBC/hpf   Bacteria, UA RARE  RARE   Ct Head Wo Contrast  02/15/2012  *RADIOLOGY REPORT*  Clinical Data: Fall because of lower extremity weakness.  CT HEAD  WITHOUT CONTRAST  Technique:  Contiguous axial images were obtained from the base of the skull through the vertex without contrast.  Comparison: 09/03/2011  Findings: The brain shows chronic generalized atrophy.  There are extensive chronic small vessel changes throughout the hemispheric deep white matter, with focal infarction in the left basal ganglia. No sign of acute infarction, mass lesion, hemorrhage, hydrocephalus or extra-axial collection.  No skull fracture.  No fluid in the sinuses, middle ears or mastoids.  There is extensive atherosclerotic calcification of the major vessels at the base of the brain.  IMPRESSION: Atrophy and chronic small vessel disease.  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  No acute or reversible findings.   Original Report Authenticated By: Paulina Fusi, M.D.       Richardean Canal, MD 02/15/12 1722  Richardean Canal, MD 02/15/12 [phone]

## 2012-02-15 NOTE — H&P (Signed)
[facility] History and Physical  [name] ZOX:[id number] DOB: 28-Oct-1928 DOA: 02/15/2012  Referring physician: Silverio Lay, MD PCP: No primary provider on file.  Specialists: None  Chief Complaint: Fall, syncopy  HPI: [name] is a 78 y.o. female  Who was at home this am and was sitting on the commode and tried to get up couldn't get up. She's not really able to relate what she felt but states specifically and categorically that she did not have any chest pain. She does say that she did have some weakness on 11/10 2013 and felt as if she could not move her left side and her left leg as well as she normally does. She has an aide at home and when he was found at 11 AM on the morning of admission 03/03/2012 that she could not get up to 8 and her called life alert and was noted that her blood sugars in the 500s. Patient admits to taking her insulin only in the morning and not in the evening. She is supposed to be on NPH insulin 20-54 units, 54 units in the morning and 20 units at the evening and for the past 3 months patient has not taken the evening dosages of her insulin. She states that she has not had any recent illnesses or been around anyone that's been sick and has been fine She relates that she's had multiple small strokes about 15 years ago and because of this has some deficits.  Emergency room evaluation revealed slightly elevated specific gravity 1.0-1 glucose of about 1000 trace hemoglobin small leukocytes negative for ketones Sodium 133 potassium 5.9 BUN and creatinine elevated at 43/1.9 which is above her baseline of 1.6 alkaline phosphatase of 119 random glucose on admission of around 500 which came down after IV fluid and 10 units of insulin to 358. 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  Review of Systems: The patient denies chest pain at present however had some chest pain 2 nights ago  wherein she took nitroglycerin. She has not had any further recurrence of chest pain. Denies any diarrhea any vomiting any cough any sputum and cold any chills any records any weakness in any one side of the body although the left leg appears to be weak to her subjectively. She has a headache change in vision but does have some poor vision at baseline. No ringing in the air is no nausea no vomiting.     Past Medical History  Diagnosis Date  . CHF (congestive heart failure)   . Coronary artery disease   . Hypertension   . Pacemaker   . Hyperlipidemia   . TIA (transient ischemic attack)   . Hypothyroidism   . Insulin dependent diabetes mellitus    Chart review  Extensive CAd h/o [stent 2004, DEs2011, 2012=NSTEMI]  H/o pacer placed [[employer]] 09/21/10  Admit 03/22/11 for CP-Myoview neg, ef 65%  Admit 05/27/10 with accelerated angina  40 pack yr h/o Tob use  Admission 06/05/09 c CP resulting in stent placement  Admission 04/21/06 with fall resulting in c6 facet injury  Admit for hypoxia 2/2 to COPD  Admit 10/15/02 c Unstable angina-Cypher stent placed that admit  Admit 11/27/01 c UA  Past Surgical History  Procedure Date  . Cardiac surgery 09/20/2009    Pacemaker   Social History:  reports that she has quit smoking. She does not have any smokeless tobacco history on file.  She reports that she does not drink alcohol or use illicit drugs. History  Substance Use Topics  . Smoking status: Former Games developer  . Smokeless tobacco: Not on file  . Alcohol Use: No   History   Social History Narrative   Patient lives alone at home.     No Known Allergies  Family History  Problem Relation Age of Onset  . Asthma Mother   . Diabetes Father   . Heart failure Father     Prior to Admission medications   Medication Sig Start Date End Date Taking? Authorizing Provider  aspirin 81 MG chewable tablet Chew 81 mg by mouth daily.     Yes Historical Provider, MD  clopidogrel (PLAVIX) 75  MG tablet Take 75 mg by mouth daily.   Yes Historical Provider, MD  insulin NPH (HUMULIN N,NOVOLIN N) 100 UNIT/ML injection Inject 20-54 Units into the skin 2 (two) times daily. Takes 54 units in the morning & 20 units at night   Yes Historical Provider, MD  isosorbide mononitrate (IMDUR) 30 MG 24 hr tablet Take 30 mg by mouth daily. 03/25/11 03/24/12 Yes Nada Boozer, NP  levothyroxine (SYNTHROID, LEVOTHROID) 25 MCG tablet Take 25 mcg by mouth daily.     Yes Historical Provider, MD  metoprolol tartrate (LOPRESSOR) 25 MG tablet Take 25 mg by mouth 2 (two) times daily.     Yes Historical Provider, MD  nitroGLYCERIN (NITROSTAT) 0.4 MG SL tablet Place 0.4 mg under the tongue every 5 (five) minutes as needed. Chest pain    Yes Historical Provider, MD  oxybutynin (DITROPAN-XL) 10 MG 24 hr tablet Take 10 mg by mouth every other day.    Yes Historical Provider, MD  simvastatin (ZOCOR) 20 MG tablet Take 20 mg by mouth every evening.   Yes Historical Provider, MD  furosemide (LASIX) 40 MG tablet Take 20 mg by mouth daily.     Historical Provider, MD  gabapentin (NEURONTIN) 300 MG capsule Take 300 mg by mouth 3 (three) times daily.      Historical Provider, MD  Multiple Vitamins-Minerals (ICAPS PO) Take 1 tablet by mouth daily.      Historical Provider, MD   Physical Exam: Filed Vitals:   02/15/12 1600 02/15/12 1625 02/15/12 1626 02/15/12 1628  BP: 138/63 138/63 158/83 152/84  Pulse: 61 70 68 70  Temp:      TempSrc:      Resp: 19     SpO2: 97%        General:  Alert pleasant oriented very hard of hearing Caucasian female  Eyes: Arcus senilis  ENT: Moderate dentition, stoic keratosis over her scalp  Neck: Full supple? Murmur in right carotid  Cardiovascular: S1-S2 no murmur rub or gallop  Respiratory: Clinically clear  Abdomen: Soft obese nondistended nontender  Skin: Grade 1 lower extremity edema without pitting  Musculoskeletal: Range of motion intact bilaterally  Psychiatric:  Euthymic  Neurologic: Motor is 515 in all 4 extremities with some subjective weakness on the left hip flexors however patient has full dorsi and plantar flexion of the same site. Sensation seems grossly intact. There is no pronator drift, extraocular movements are intact uvula is midline smile is symmetric shoulder power is 5 out 5 bilaterally reflexes are 2/3 with a brisk this she does have osteoarthritic changes in her knees however brachioradialis she is 2/3 as well gait was not assessed however she has no evidences of dysdiadochokinesia.  Labs on Admission:  Basic Metabolic Panel:  Lab 02/15/12 1610  NA 133*  K 5.9*  CL 98  CO2 25  GLUCOSE 493*  BUN 43*  CREATININE 1.89*  CALCIUM 9.9  MG --  PHOS --   Liver Function Tests:  Lab 02/15/12 1539  AST 19  ALT 16  ALKPHOS 119*  BILITOT 1.1  PROT 7.0  ALBUMIN 3.6   No results found for this basename: LIPASE:5,AMYLASE:5 in the last 168 hours No results found for this basename: AMMONIA:5 in the last 168 hours CBC:  Lab 02/15/12 1539  WBC 12.1*  NEUTROABS 8.3*  HGB 14.2  HCT 41.7  MCV 87.8  PLT 151   Cardiac Enzymes: No results found for this basename: CKTOTAL:5,CKMB:5,CKMBINDEX:5,TROPONINI:5 in the last 168 hours  BNP (last 3 results)  Basename 03/22/11 1555  PROBNP 127.0   CBG:  Lab 02/15/12 1655 02/15/12 1430  GLUCAP 435* 517*    Radiological Exams on Admission: Ct Head Wo Contrast  02/15/2012  *RADIOLOGY REPORT*  Clinical Data: Fall because of lower extremity weakness.  CT HEAD WITHOUT CONTRAST  Technique:  Contiguous axial images were obtained from the base of the skull through the vertex without contrast.  Comparison: 09/03/2011  Findings: The brain shows chronic generalized atrophy.  There are extensive chronic small vessel changes throughout the hemispheric deep white matter, with focal infarction in the left basal ganglia. No sign of acute infarction, mass lesion, hemorrhage, hydrocephalus or extra-axial  collection.  No skull fracture.  No fluid in the sinuses, middle ears or mastoids.  There is extensive atherosclerotic calcification of the major vessels at the base of the brain.  IMPRESSION: Atrophy and chronic small vessel disease.  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  No acute or reversible findings.   Original Report Authenticated By: Paulina Fusi, M.D.     EKG: Independently reviewed. EKG reveals normal sinus rhythm with PR interval of 0.12 QRS axis of 20 date does appear to be some peaked T waves in V2 V4 and 5 hour these are not new. I can appreciate and Q waves in lead 3 patient is now me any LVH criteria and although there is early RS transition across precordial leads.  Assessment/Plan Principal Problem:  *DM hyperosmolar coma,type 2, not at goal Active Problems:  CHF (congestive heart failure) chronic and stable  Coronary artery disease  Hypertension  Hypothyroidism  Hyperlipidemia  TIA (transient ischemic attack)  Pacemaker  CAD (coronary artery disease)   1. Syncopy-likely more metabolic>>neurologic.  No deficit therefore with neg Ct would not work-up 2. Hyper osmolar nonketotic diabetic state-patient's blood sugars responded nicely to just 10 units of insulin and copious IV fluids. We will continue IV fluids after another liter bolus in the ED at 125 cc per hour until 8 AM 02/16/2012. I do not feel inclined at this time start the patient on insulin stabilizer-she needs more fluid than anything else. She does not have an anion gap and no ketones [10 = anion gap]-she will need diabetic education and monitoring and potentially will need someone to monitor her meds. 3. Significant coronary artery disease history-given her chest pain 2-3 days ago and no inciting event for her hyperosmolar state, I will get one set of cardiac enzymes. Her EKG is not very impressive for any changes 4. Mild hyponatremia-likely dilutional secondary to hyperosmolar state-repeat basic metabolic panel in the  morning 5. Hyperkalemia-patient was given sodium bicarbonate by ED physician-I am inclined to repeat the basic metabolic panel to ensure that this is actually a true hyperkalemia not hemolysis, and will give Kayexalate  if needed. Patient will need overall potassium replacement given acid-base shift with severe diabetes however in her case am less inclined to aggressively control given her EKG findings and not impressive for significant hyperkalemia. She'll be kept on telemetry and monitored closely 6. Hypertension continue home dosages of metoprolol 25 twice a day, I changed her isosorbide from 30-15 mg every 24 hourly, we will hold her Lasix as well as her gabapentin oxybutynin and multivitamins for now. 7. ? TIA history-CT scan of the head is negative-given she has no gross deficit, I am less inclined at this stage to order a MRI of the brain. 8. Volume depletion-acute kidney injury on a baseline of CTD stage II to 3-aggressive rehydration with the caveat that patient has significant coronary artery disease-see above will get basic metabolic panel q. 6-8 hourly to ensure her volume status improving however will also need to ensure that she does not develop an anion gap and back it unlikely given she has hyperosmolar state]   Code Status: Full Family Communication: Diwscussed with neivce in detial on phone-Mary craver Disposition Plan: SDU  Time spent: 8   Mahala Menghini [facility] Pager [phone]  If 7PM-7AM, please contact night-coverage [email or web address] Password [facility] 02/15/2012, 5:19 PM

## 2012-02-15 NOTE — ED Notes (Signed)
cbg 155 

## 2012-02-15 NOTE — ED Provider Notes (Signed)
History     CSN: [id number] Arrival date & time 02/15/12  1410 First MD Initiated Contact with Patient 02/15/12 1420     Chief Complaint  Patient presents with  . Hyperglycemia  . Fall   HPI Patient presents to the emergency room today after having an episode of weakness and falling off the commode. Patient went to the bathroom today but was unable to stand up. She called 911 for assistance and was found to have elevated blood sugar.  She was brought to the emergency room.  She felt like she was having trouble moving her left leg earlier but is not having trouble with that now. She denies any back pain, headache or injury. She does not have any chest pain or shortness of breath but does admit she had an episode of chest pain a couple of days ago that required her to take one of her nitroglycerin tablets.  Past Medical History  Diagnosis Date  . CHF (congestive heart failure)   . Coronary artery disease   . Hypertension   . Pacemaker   . Hyperlipidemia   . TIA (transient ischemic attack)   . Hypothyroidism   . Insulin dependent diabetes mellitus     Past Surgical History  Procedure Date  . Cardiac surgery 09/20/2009    Pacemaker    History reviewed. No pertinent family history.  History  Substance Use Topics  . Smoking status: Former Games developer  . Smokeless tobacco: Not on file  . Alcohol Use: No    OB History    Grav Para Term Preterm Abortions TAB SAB Ect Mult Living                  Review of Systems  All other systems reviewed and are negative.    Allergies  Review of patient's allergies indicates no known allergies.  Home Medications   Current Outpatient Rx  Name  Route  Sig  Dispense  Refill  . ASPIRIN 81 MG PO CHEW   Oral   Chew 81 mg by mouth daily.           Marland Kitchen CLOPIDOGREL BISULFATE 75 MG PO TABS   Oral   Take 75 mg by mouth daily.         . FUROSEMIDE 40 MG PO TABS   Oral   Take 20 mg by mouth daily.          Marland Kitchen GABAPENTIN 300 MG PO CAPS   Oral   Take 300 mg by mouth 3 (three) times daily.           . INSULIN ISOPHANE HUMAN 100 UNIT/ML [place] SUSP   Subcutaneous   Inject 20-54 Units into the skin 2 (two) times daily. Takes 54 units in the morning & 20 units at night         . ISOSORBIDE MONONITRATE ER 30 MG PO TB24   Oral   Take 30 mg by mouth daily.         Marland Kitchen LEVOTHYROXINE SODIUM 25 MCG PO TABS   Oral   Take 25 mcg by mouth daily.           Marland Kitchen METOPROLOL TARTRATE 25 MG PO TABS   Oral   Take 25 mg by mouth 2 (two) times daily.           . ICAPS PO   Oral   Take 1 tablet by mouth daily.           Marland Kitchen NITROGLYCERIN 0.4  MG SL SUBL   Sublingual   Place 0.4 mg under the tongue every 5 (five) minutes as needed. Chest pain          . OXYBUTYNIN CHLORIDE ER 10 MG PO TB24   Oral   Take 10 mg by mouth every other day.          Marland Kitchen SIMVASTATIN 20 MG PO TABS   Oral   Take 20 mg by mouth every evening.           BP 157/58  Pulse 61  Temp 97.6 F (36.4 C) (Oral)  Resp 15  SpO2 96%  Physical Exam  Nursing note and vitals reviewed. Constitutional: She appears well-developed and well-nourished. No distress.  HENT:  Head: Normocephalic and atraumatic.  Right Ear: External ear normal.  Left Ear: External ear normal.  Mouth/Throat: No oropharyngeal exudate (mucous membranes are dry).  Eyes: Conjunctivae normal are normal. Right eye exhibits no discharge. Left eye exhibits no discharge. No scleral icterus.  Neck: Neck supple. No tracheal deviation present.  Cardiovascular: Normal rate, regular rhythm and intact distal pulses.   Pulmonary/Chest: Effort normal and breath sounds normal. No stridor. No respiratory distress. She has no wheezes. She has no rales.  Abdominal: Soft. Bowel sounds are normal. She exhibits no distension. There is no tenderness. There is no rebound and no guarding.  Musculoskeletal: She exhibits no edema and no tenderness.  Neurological: She is alert. She has normal strength. No  sensory deficit. Cranial nerve deficit:  no gross defecits noted. She exhibits normal muscle tone. She displays no seizure activity. Coordination normal.       5 out of 5 grip strength bilaterally, able to lift both legs off the bed for 5 seconds bilaterally, 5 over 5 strength plantar flexion, normal sensation to light touch bilateral upper extremities and lower extremities  Skin: Skin is warm and dry. No rash noted.  Psychiatric: She has a normal mood and affect.    ED Course  Procedures (including critical care time)  Rate: 67  Rhythm: normal sinus rhythm  QRS Axis: normal  Intervals: normal  ST/T Wave abnormalities: normal  Conduction Disutrbances:none  Narrative Interpretation: Early precordial RS transition , inferior Q waves  Old EKG Reviewed: Q-wave changes are new  Labs Reviewed  GLUCOSE, CAPILLARY - Abnormal; Notable for the following:    Glucose-Capillary 517 (*)     All other components within normal limits  COMPREHENSIVE METABOLIC PANEL  PROTIME-INR  APTT  TYPE AND SCREEN  URINALYSIS, ROUTINE W REFLEX MICROSCOPIC  CBC WITH DIFFERENTIAL   Ct Head Wo Contrast  02/15/2012  *RADIOLOGY REPORT*  Clinical Data: Fall because of lower extremity weakness.  CT HEAD WITHOUT CONTRAST  Technique:  Contiguous axial images were obtained from the base of the skull through the vertex without contrast.  Comparison: 09/03/2011  Findings: The brain shows chronic generalized atrophy.  There are extensive chronic small vessel changes throughout the hemispheric deep white matter, with focal infarction in the left basal ganglia. No sign of acute infarction, mass lesion, hemorrhage, hydrocephalus or extra-axial collection.  No skull fracture.  No fluid in the sinuses, middle ears or mastoids.  There is extensive atherosclerotic calcification of the major vessels at the base of the brain.  IMPRESSION: Atrophy and chronic small vessel disease.  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  No acute or reversible  findings.   Original Report Authenticated By: Paulina Fusi, M.D.      MDM  Pt with near syncope and  hyperglycemia.  No signs of stroke or focal weakness on exam.   Will check labs and re- evaluate.  Discussed with Dr Silverio Lay.  May require admission for her weakness and near syncope.        Celene Kras, MD 02/15/12 [phone]

## 2012-02-15 NOTE — ED Notes (Addendum)
Pt niece Heloise Purpura called and left phone [phone]. She states she is Pt's power of attorney and asked to be called when pt get discharged or admitted. Pt identifies the niece states "you'll can call her."

## 2012-02-15 NOTE — ED Notes (Addendum)
Pt here from home via EMS; EMS reports that pt CBG was 500+. EMS was called because fall while going to the bathroom and pt states " my left gave out." Vitals per EMS O2 97% on room air, HR 87, RR 16, BP 142/82.

## 2012-02-16 ENCOUNTER — Inpatient Hospital Stay (HOSPITAL_COMMUNITY): Payer: Medicare Other

## 2012-02-16 LAB — BASIC METABOLIC PANEL
BUN: 32 mg/dL — ABNORMAL HIGH (ref 6–23)
CO2: 22 mEq/L (ref 19–32)
CO2: 21 mEq/L (ref 19–32)
Chloride: 105 mEq/L (ref 96–112)
Creatinine, Ser: 1.66 mg/dL — ABNORMAL HIGH (ref 0.50–1.10)
Glucose, Bld: 176 mg/dL — ABNORMAL HIGH (ref 70–99)
Glucose, Bld: 229 mg/dL — ABNORMAL HIGH (ref 70–99)
Potassium: 4.6 mEq/L (ref 3.5–5.1)
Sodium: 137 mEq/L (ref 135–145)
Sodium: 142 mEq/L (ref 135–145)

## 2012-02-16 LAB — CBC
Hemoglobin: 13.7 g/dL (ref 12.0–15.0)
MCH: 30 pg (ref 26.0–34.0)
MCV: 86.8 fL (ref 78.0–100.0)
RBC: 4.56 MIL/uL (ref 3.87–5.11)

## 2012-02-16 LAB — GLUCOSE, CAPILLARY
Glucose-Capillary: 143 mg/dL — ABNORMAL HIGH (ref 70–99)
Glucose-Capillary: 149 mg/dL — ABNORMAL HIGH (ref 70–99)
Glucose-Capillary: 170 mg/dL — ABNORMAL HIGH (ref 70–99)
Glucose-Capillary: 275 mg/dL — ABNORMAL HIGH (ref 70–99)
Glucose-Capillary: 156 mg/dL — ABNORMAL HIGH (ref 70–99)
Glucose-Capillary: 191 mg/dL — ABNORMAL HIGH (ref 70–99)
Glucose-Capillary: 133 mg/dL — ABNORMAL HIGH (ref 70–99)
Glucose-Capillary: 101 mg/dL — ABNORMAL HIGH (ref 70–99)

## 2012-02-16 LAB — CK TOTAL AND CKMB (NOT AT ARMC)
CK, MB: 3.5 ng/mL (ref 0.3–4.0)
CK, MB: 3.3 ng/mL (ref 0.3–4.0)
Relative Index: 1.9 (ref 0.0–2.5)
Relative Index: 1.8 (ref 0.0–2.5)

## 2012-02-16 LAB — MRSA PCR SCREENING: MRSA by PCR: NEGATIVE

## 2012-02-16 MED ORDER — SIMVASTATIN 20 MG PO TABS
20.0000 mg | ORAL_TABLET | Freq: Every evening | ORAL | Status: DC
  Administered 2012-02-16: 20 mg via ORAL
  Filled 2012-02-16 (×2): qty 1

## 2012-02-16 MED ORDER — GABAPENTIN 300 MG PO CAPS
300.0000 mg | ORAL_CAPSULE | Freq: Three times a day (TID) | ORAL | Status: DC
  Administered 2012-02-16 – 2012-02-17 (×3): 300 mg via ORAL
  Filled 2012-02-16 (×5): qty 1

## 2012-02-16 MED ORDER — OXYBUTYNIN CHLORIDE ER 10 MG PO TB24
10.0000 mg | ORAL_TABLET | ORAL | Status: DC
  Administered 2012-02-16: 10 mg via ORAL
  Filled 2012-02-16 (×2): qty 1

## 2012-02-16 NOTE — Progress Notes (Signed)
PT Cancellation Note  Patient Details Name: SAMIA KUKLA MRN: [id number] DOB: May 02, 1928   Cancelled Treatment:    Reason Eval/Treat Not Completed: Patient at procedure or test/unavailable. Pt leaving the floor for x-ray. Will f/u as time allows.    [facility] HELEN 02/16/2012, 2:31 PM Pager: [phone]

## 2012-02-16 NOTE — Evaluation (Signed)
Physical Therapy Evaluation Patient Details Name: [name] MRN: [id number] DOB: [birth year] Today's Date: 02/16/2012 Time: 7829-5621 PT Time Calculation (min): 20 min  PT Assessment / Plan / Recommendation Clinical Impression  Pt admitted s/p fall, no acute findings. Pt refused mobility past sitting at EOB. Discussed with pt the need for further mobility prior to discharging to determine SNF vs HH. Pt adamantly refuses to go to a SNF but she lives at home alone and has had multiple falls. Discussed with patient's family friend in the room the need for increased supervision if patient goes home.     PT Assessment  Patient needs continued PT services    Follow Up Recommendations  Home health PT;Supervision/Assistance - 24 hour (HH 24/7  vs SNF)    Does the patient have the potential to tolerate intense rehabilitation      Barriers to Discharge Decreased caregiver support      Equipment Recommendations  None recommended by PT    Recommendations for Other Services     Frequency Min 3X/week    Precautions / Restrictions Precautions Precautions: Fall Restrictions Weight Bearing Restrictions: No   Pertinent Vitals/Pain Pain in L leg 5/10      Mobility  Bed Mobility Bed Mobility: Supine to Sit;Sitting - Scoot to Edge of Bed;Sit to Supine Supine to Sit: 4: Min assist;With rails Sitting - Scoot to [place] of Bed: 4: Min guard Sit to Supine: 4: Min assist Details for Bed Mobility Assistance: VC for safe technique Transfers Transfers: Not assessed (pt refused)    Shoulder Instructions     Exercises     PT Diagnosis: Difficulty walking;Acute pain  PT Problem List: Decreased activity tolerance;Decreased mobility;Decreased knowledge of use of DME;Decreased safety awareness;Decreased knowledge of precautions PT Treatment Interventions: DME instruction;Gait training;Stair training;Functional mobility training;Therapeutic activities;Therapeutic exercise;Balance  training;Patient/family education   PT Goals Acute Rehab PT Goals PT Goal Formulation: With patient Time For Goal Achievement: 03/01/12 Potential to Achieve Goals: Good Pt will go Supine/Side to Sit: with modified independence PT Goal: Supine/Side to Sit - Progress: Goal set today Pt will go Sit to Supine/Side: with modified independence PT Goal: Sit to Supine/Side - Progress: Goal set today Pt will go Sit to Stand: with supervision PT Goal: Sit to Stand - Progress: Goal set today Pt will go Stand to Sit: with supervision PT Goal: Stand to Sit - Progress: Goal set today Pt will Transfer Bed to Chair/Chair to Bed: with supervision PT Transfer Goal: Bed to Chair/Chair to Bed - Progress: Goal set today Pt will Ambulate: >150 feet;with supervision;with least restrictive assistive device PT Goal: Ambulate - Progress: Goal set today  Visit Information  Last PT Received On: 02/16/12 Assistance Needed: +1 Reason Eval/Treat Not Completed: Patient at procedure or test/unavailable    Subjective Data  Patient Stated Goal: to start moving   Prior Functioning  Home Living Lives With: Alone Available Help at Discharge: Other (Comment) (see below) Type of Home: House Home Access: Stairs to enter [employer] of Steps: 3 Entrance Stairs-Rails: Right;Left;Can reach both Home Layout: One level Bathroom Shower/Tub: Door;Walk-in Stage manager: Standard Bathroom Accessibility: Yes How Accessible: Accessible via walker Home Adaptive Equipment: Walker - rolling;Straight cane;Built-in shower seat Additional Comments: niece gets groceries; Aide comes 4x a week for 3 hours Prior Function Level of Independence: Independent with assistive device(s) Able to Take Stairs?: Yes Driving: Yes Vocation: Retired Musician: No difficulties Dominant Hand: Right    Cognition  Overall Cognitive Status: Appears within functional limits  for tasks  assessed/performed Arousal/Alertness: Awake/alert Orientation Level: Appears intact for tasks assessed Behavior During Session: [facility] for tasks performed    Extremity/Trunk Assessment Right Lower Extremity Assessment RLE ROM/Strength/Tone: Within functional levels Left Lower Extremity Assessment LLE ROM/Strength/Tone: Within functional levels   Balance Balance Balance Assessed: Yes Static Sitting Balance Static Sitting - Balance Support: Bilateral upper extremity supported;Feet supported Static Sitting - Level of Assistance: 7: Independent  End of Session PT - End of Session Activity Tolerance: Patient limited by fatigue Patient left: in bed;with call bell/phone within reach;with bed alarm set;with family/visitor present Nurse Communication: Mobility status  GP     Milana Kidney 02/16/2012, 5:26 PM  02/16/2012 Milana Kidney DPT PAGER: [phone] OFFICE: [phone]

## 2012-02-16 NOTE — Progress Notes (Signed)
Inpatient Diabetes Program Recommendations  AACE/ADA: New Consensus Statement on Inpatient Glycemic Control (2013)  Target Ranges:  Prepandial:   less than 140 mg/dL      Peak postprandial:   less than 180 mg/dL (1-2 hours)      Critically ill patients:  140 - 180 mg/dL   Reason for Visit: Referral received.  Patient admitted with HHNK.  Discussed insulin regimen with patient.  She states that she takes NPH 35 units in AM and 18 units in the PM.  She admits to only checking her CBG's once daily but says that her doctor wants her to check 2 times a day. When asked about what her CBG's usually run, she states that they are between 170 and 180 mg/dL.  She admits also to missing insulin doses and eating "lots of sweets".  According to RN she has help that comes in 2-3 hours daily.  Her PCP is Dr. Pete Glatter.  Made referral to [facility] case manager.  Amy also benefit from home health referral for further education/follow up for diabetes. Please check A1C to determine prehospitalization glycemic control.  Will follow.

## 2012-02-16 NOTE — Progress Notes (Addendum)
Pt to TX to 5507, VSS, called report. Called [phone] to get meal tray sent to 5500, informed that 5500 would have to call upon TX.

## 2012-02-16 NOTE — Progress Notes (Signed)
PROGRESS NOTE  [name] ZOX:[id number] DOB: 1928/05/22 DOA: 02/15/2012 PCP: No primary provider on file.  Brief narrative: 15 year Caucasian female admitted 02/15/2012 with history of a mechanical fall. She categorically stated for the past 2-3 days she was feeling weaker, more so on the left side than on the right. She stated that she was on the commode at time that she fell and it was noted by EMS picked her up at her blood sugars in the 500s. Patient admits to noncompliance on her insulin regimen. Emergency room evaluation revealed slightly elevated specific gravity 1.0-1 glucose of about 1000 trace hemoglobin small leukocytes negative for ketones  Sodium 133 potassium 5.9 BUN and creatinine elevated at 43/1.9 which is above her baseline of 1.6 alkaline phosphatase of 119 random glucose on admission of around 500 which came down after IV fluid and 10 units of insulin to 358.  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   Past medical history-As per Problem list Chart reviewed as below- Chart review  Extensive CAd h/o [stent 2004, DES 2011, 2012=NSTEMI]  H/o pacer placed [Medtronic] 09/21/10  Admit 03/22/11 for CP-Myoview neg, ef 65%  Admit 05/27/10 with accelerated angina  40 pack yr h/o Tob use  Admission 06/05/09 c CP resulting in stent placement  Admission 04/21/06 with fall resulting in c6 facet injury  Admit for hypoxia 2/2 to COPD  Admit 10/15/02 c Unstable angina-Cypher stent placed that admit  Admit 11/27/01 c UA  Consultants:  None  Procedures:  CT scan head = no new infarcts, 02/15/2012  Antibiotics:  None currently   Subjective  States she has left lower extremity pain medial aspect of the left knee. States that this is potentially new States that this might have occurred when she fell onto the tiling of her bathroom. Tolerating diet fairly well. Has not been mobile as yet.   Objective     Interim History: Nursing reports no concerns  Telemetry: Normal sinus rhythm with no red alarms-telemetry discontinued 02/16/2012  Objective: Filed Vitals:   02/15/12 2209 02/16/12 0026 02/16/12 0430 02/16/12 0800  BP: 141/76 154/54 138/92 132/58  Pulse: 73 67 65   Temp:  97.6 F (36.4 C) 97.6 F (36.4 C) 97.8 F (36.6 C)  TempSrc:  Oral Oral Oral  Resp:  14 18   Height:      Weight:      SpO2:  96% 96%     Intake/Output Summary (Last 24 hours) at 02/16/12 8119 Last data filed at 02/16/12 0730  Gross per 24 hour  Intake 977.08 ml  Output    450 ml  Net 527.08 ml    Exam:  General: Alert pleasant oriented Caucasian female looking younger than stated age, arcus senilis, no pallor, no icterus Cardiovascular: S1-S2,? Bruit right carotid, no other murmur is heard Respiratory: Clinically clear Abdomen: Soft nontender nondistended Skin pitting edema lower extremities swelling, 1-2, medial joint line tenderness on the left knee. Neuro cranial nerves II through XII grossly intact motor grossly intact   Data Reviewed: Basic Metabolic Panel:  Lab 02/16/12 1478 02/15/12 2021 02/15/12 1748 02/15/12 1539  NA 137 139 137 133*  K 4.2 4.5 -- --  CL 105 104 102 98  CO2 22 23 24 25   GLUCOSE 176* 147* 337* 493*  BUN 36* 38* 40* 43*  CREATININE 1.66* 1.74* 1.80* 1.89*  CALCIUM 9.0 9.7 9.6 9.9  MG -- -- -- --  PHOS -- -- -- --   Liver Function Tests:  Lab 02/15/12 1539  AST 19  ALT 16  ALKPHOS 119*  BILITOT 1.1  PROT 7.0  ALBUMIN 3.6   No results found for this basename: LIPASE:5,AMYLASE:5 in the last 168 hours No results found for this basename: AMMONIA:5 in the last 168 hours CBC:  Lab 02/15/12 1539  WBC 12.1*  NEUTROABS 8.3*  HGB 14.2  HCT 41.7  MCV 87.8  PLT 151   Cardiac Enzymes:  Lab 02/16/12 0203 02/15/12 1836  CKTOTAL 183* 171  CKMB 3.5 3.6  CKMBINDEX -- --  TROPONINI <0.30 <0.30   BNP: No components found with this basename:  POCBNP:5 CBG:  Lab 02/16/12 0807 02/16/12 0548 02/16/12 0346 02/16/12 0220 02/15/12 2355  GLUCAP 156* 149* 143* 170* 275*    Recent Results (from the past 240 hour(s))  MRSA PCR SCREENING     Status: Normal   Collection Time   02/16/12  2:21 AM      Component Value Range Status Comment   MRSA by PCR NEGATIVE  NEGATIVE Final      Studies:              All Imaging reviewed and is as per above notation   Scheduled Meds:   . aspirin  81 mg Oral Daily  . clopidogrel  75 mg Oral Q breakfast  . heparin  5,000 Units Subcutaneous Q8H  . insulin aspart  0-15 Units Subcutaneous TID WC  . [COMPLETED] insulin aspart  10 Units Intravenous Once  . insulin aspart  4 Units Subcutaneous TID WC  . insulin glargine  10 Units Subcutaneous QHS  . isosorbide mononitrate  15 mg Oral Daily  . levothyroxine  25 mcg Oral QAC breakfast  . metoprolol tartrate  25 mg Oral BID  . [COMPLETED] sodium chloride  1,000 mL Intravenous Once  . [COMPLETED] sodium chloride  500 mL Intravenous Once  . [DISCONTINUED] sodium bicarbonate  50 mEq Intravenous Once   Continuous Infusions:   . [DISCONTINUED] sodium chloride 1,000 mL (02/15/12 1452)  . [DISCONTINUED] sodium chloride 125 mL/hr (02/15/12 2111)  . [DISCONTINUED] insulin (NOVOLIN-R) infusion       Assessment/Plan: 1. Syncopy-likely more metabolic>>neurologic. No deficit therefore with neg Ct would not work-up 2. Hyper osmolar nonketotic diabetic state-patient's blood sugars responded nicely to just 10 units of insulin and copious IV fluids. She does not have an anion gap and no ketones [10 = anion gap]-she will need diabetic education and monitoring and potentially will need someone to monitor her meds-ordered.  She has stable blood sugars [140-170's] and will be transferred to Tele today 3. Significant coronary artery disease history-given her chest pain 2-3 days ago and no inciting event for her hyperosmolar state, I will get one set of cardiac enzymes.  Her EKG is not very impressive for any changes 4. Mild hyponatremia-likely dilutional secondary to hyperosmolar state-repeat basic metabolic panel in the morning 5. Hyperkalemia-patient was given sodium bicarbonate by ED physician-I am inclined to repeat the basic metabolic panel to ensure that this is actually a true hyperkalemia not hemolysis, and will give Kayexalate if needed. Resolved-K 4.9 6. Hypertension continue home dosages of metoprolol 25 twice a day, I changed her isosorbide from 30-15 mg every 24 hourly, we will hold her Lasix as well as her gabapentin oxybutynin and multivitamins for now. 7. ? TIA history-CT scan of the head is negative-given she has no gross deficit, I am less inclined at this stage to  order a MRI of the brain. 8. Volume depletion-acute kidney injury on a baseline of CTD stage II to 3-aggressive rehydration with the caveat that patient has significant coronary artery disease-IVf d/c 11/14.  Code Status: Full Family Communication: None at bedside-patient conversant Disposition Plan: Tele-potential d/c once Pt/Ot and DM coordinator see her   Pleas Koch, MD  [facility] Pager [phone] 02/16/2012, 9:28 AM    LOS: 1 day

## 2012-02-16 NOTE — Progress Notes (Signed)
Pt. Admitted to 3300; CBG 173, vss.  Call light within reach.  Pt. Oriented to room and unit.  Will continue to monitor.  Vivi Martens RN

## 2012-02-16 NOTE — Progress Notes (Signed)
Utilization review completed.  

## 2012-02-16 NOTE — Progress Notes (Signed)
Patient evaluated for community based chronic disease management services with [facility] Care Management Program as a benefit of patient's [employer]. Patient will receive a post discharge transition of care call and will be evaluated for monthly home visits for assessments and diabetes disease process education. Spoke with patient at bedside to explain [facility] Care Management services. Left contact information and [facility] literature at bedside. Made inpatient Case Manager aware that [facility] Care Management following. Of note, [facility] Care Management services does not replace or interfere with any services that are arranged by inpatient case management or social work.  For additional questions or referrals please contact Anibal Henderson BSN RN [facility] Liaison at [phone].

## 2012-02-17 LAB — GLUCOSE, CAPILLARY: Glucose-Capillary: 153 mg/dL — ABNORMAL HIGH (ref 70–99)

## 2012-02-17 MED ORDER — INSULIN GLARGINE 100 UNIT/ML ~~LOC~~ SOLN: 20.0000 [IU] | Freq: Every day | SUBCUTANEOUS | Status: DC

## 2012-02-17 MED ORDER — FUROSEMIDE 40 MG PO TABS: 20.0000 mg | ORAL_TABLET | ORAL | Status: DC

## 2012-02-17 NOTE — Progress Notes (Signed)
Physical Therapy Treatment Patient Details Name: [name] MRN: [id number] DOB: 13-Sep-1928 Today's Date: 02/17/2012 Time: 9147-8295 PT Time Calculation (min): 28 min  PT Assessment / Plan / Recommendation Comments on Treatment Session  Pt adm after fall at home.  Pt with history of frequent falls at home and lives alone.  Spoke to pt about ST-SNF and pt asking questions about it.  Will have social worker talk to pt.  It pt agrees believe ST-SNF would be best option. If pt refuses SNF then HHPT.    Follow Up Recommendations  SNF     Does the patient have the potential to tolerate intense rehabilitation     Barriers to Discharge        Equipment Recommendations  None recommended by PT    Recommendations for Other Services    Frequency Min 3X/week   Plan Discharge plan needs to be updated;Frequency remains appropriate    Precautions / Restrictions Precautions Precautions: Fall   Pertinent Vitals/Pain Pain in lt leg but pt unable to rate.    Mobility  Transfers Transfers: Sit to Stand;Stand to Sit Sit to Stand: 5: Supervision;With upper extremity assist;With armrests;From chair/3-in-1 Stand to Sit: 5: Supervision;With upper extremity assist;With armrests;To chair/3-in-1 Ambulation/Gait Ambulation/Gait Assistance: 4: Min guard Ambulation Distance (Feet): 225 Feet Assistive device: Rolling walker Ambulation/Gait Assistance Details: verbal cues to stay closer to walker and stand more erect Gait Pattern: Step-through pattern;Decreased step length - left;Trunk flexed    Exercises General Exercises - Upper Extremity Shoulder Flexion: AROM;Both;10 reps;Seated General Exercises - Lower Extremity Long Arc Quad: AROM;Both;10 reps;Seated Hip Flexion/Marching: AAROM;Both;10 reps;Seated   PT Diagnosis:    PT Problem List:   PT Treatment Interventions:     PT Goals Acute Rehab PT Goals Pt will go Sit to Stand: with modified independence PT Goal: Sit to Stand -  Progress: Updated due to goal met Pt will go Stand to Sit: with modified independence PT Goal: Stand to Sit - Progress: Updated due to goals met PT Goal: Ambulate - Progress: Progressing toward goal  Visit Information  Last PT Received On: 02/17/12 Assistance Needed: +1    Subjective Data  Subjective: "How did I do?"   Cognition  Overall Cognitive Status: Appears within functional limits for tasks assessed/performed Arousal/Alertness: Awake/alert Orientation Level: Appears intact for tasks assessed Behavior During Session: [facility] for tasks performed    Balance  Static Standing Balance Static Standing - Balance Support: Bilateral upper extremity supported (on walker) Static Standing - Level of Assistance: 5: Stand by assistance  End of Session PT - End of Session Equipment Utilized During Treatment: Gait belt Activity Tolerance: Patient tolerated treatment well Patient left: in chair Nurse Communication: Mobility status   GP     Fany Cavanaugh 02/17/2012, 10:11 AM  Skip Mayer PT [phone]

## 2012-02-17 NOTE — Discharge Summary (Signed)
Physician Discharge Summary  [name] EAV:[id number] DOB: Oct 03, 1928 DOA: 02/15/2012  PCP: No primary provider on file.  Admit date: 02/15/2012 Discharge date: 02/17/2012  Time spent: 35 minutes  Recommendations for Outpatient Follow-up:  1. Needs out-patient DM teaching 2. Recommend A1c when her sugars are stable  3. OT to follow c [facility] RN 4. Close cardiology/PCP f/u   Discharge Diagnoses:  Principal Problem:  *DM hyperosmolar coma,type 2, not at goal Active Problems:  CHF (congestive heart failure) chronic and stable  Coronary artery disease  Hypertension  Hypothyroidism  Hyperlipidemia  TIA (transient ischemic attack)  Pacemaker  CAD (coronary artery disease)   Discharge Condition: stable  Diet recommendation: Diabetic heart healthy  Filed Weights   02/15/12 1950  Weight: 89.5 kg (197 lb 5 oz)    History of present illness:  Brief narrative:  64 year Caucasian female admitted 02/15/2012 with history of a mechanical fall. She categorically stated for the past 2-3 days she was feeling weaker, more so on the left side than on the right. She stated that she was on the commode at time that she fell and it was noted by EMS picked her up at her blood sugars in the 500s. Patient admits to noncompliance on her insulin regimen.  Emergency room evaluation revealed slightly elevated specific gravity 1.0-1 glucose of about 1000 trace hemoglobin small leukocytes negative for ketones  Sodium 133 potassium 5.9 BUN and creatinine elevated at 43/1.9 which is above her baseline of 1.6 alkaline phosphatase of 119 random glucose on admission of around 500 which came down after IV fluid and 10 units of insulin to 358.  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   Past medical history-As per Problem list  Chart reviewed as below-  Chart review  Extensive CAd h/o [stent 2004, DES 2011, 2012=NSTEMI]  H/o pacer  placed [Medtronic] 09/21/10  Admit 03/22/11 for CP-Myoview neg, ef 65%  Admit 05/27/10 with accelerated angina  40 pack yr h/o Tob use  Admission 06/05/09 c CP resulting in stent placement  Admission 04/21/06 with fall resulting in c6 facet injury  Admit for hypoxia 2/2 to COPD  Admit 10/15/02 c Unstable angina-Cypher stent placed that admit  Admit 11/27/01 c UA  Hospital Course:  1. Syncopy-likely more metabolic>>neurologic. No deficit therefore with neg Ct would not work-up 2. Hyper osmolar nonketotic diabetic state-patient's blood sugars responded nicely to just 10 units of insulin in the ED and copious IV fluids. She does not have an anion gap and no ketones [10 = anion gap]-she will need diabetic education and monitoring and potentially will need someone to monitor her meds-ordered. She has stable blood sugars [140-170's] and will be transferred to Tele today-She was transitioned to Lantus 20 units from NPH bid for ease of use and will get out-patient DM teaching and a Home health RN to check on her-[facility] has been set up for her as well 3. Significant coronary artery disease history-given her chest pain 2-3 days ago and no inciting event for her hyperosmolar state, I will get one set of cardiac enzymes. Her EKG is not very impressive for any changes--Out-patient follow-up c Dr. Allyson Sabal noted soon per patient 4. Mild hyponatremia-likely dilutional secondary to hyperosmolar state--resolvedg 5. Hyperkalemia-patient was given sodium bicarbonate by ED physician-I am inclined to repeat the basic metabolic panel to ensure that this is actually a true hyperkalemia not hemolysis, and will give Kayexalate if needed.  Resolved-K 4.9 6. Hypertension continue home dosages of metoprolol 25 twice a day, I changed her isosorbide from 30->>>mg every 24 hourly on admission which, due to elevated blood pressure was changed back to 30 mg on d/c, we will hold her Lasix as well as her gabapentin oxybutynin and multivitamins for  now.  Lasix qod 20 mg implemented at D/c 7. 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  Recommend consideration of nephrology input out-patient 8. ? TIA history-CT scan of the head is negative-given she has no gross deficit, I am less inclined at this stage to order a MRI of the brain. 9. Volume depletion-acute kidney injury on a baseline of CTD stage II to 3-aggressive rehydration with the caveat that patient has significant coronary artery disease-IVf d/c 11/14.    Discharge Exam: Filed Vitals:   02/16/12 1233 02/16/12 1326 02/16/12 2109 02/17/12 0509  BP: 141/60 184/78 150/67 148/64  Pulse: 54 68 67 73  Temp: 98 F (36.7 C) 97.5 F (36.4 C) 98.3 F (36.8 C) 97.7 F (36.5 C)  TempSrc: Oral Oral Oral Oral  Resp: 20 20 20 18   Height:      Weight:      SpO2: 93% 97% 93% 94%    Well, a little sleepy and slightly confused No other issues.  Reading newspaper  General: alert, pleasant Cardiovascular: s1 s2 no m/r/g Respiratory: clear  Discharge Instructions  Discharge Orders    Future Orders Please Complete By Expires   Diet - low sodium heart healthy      Increase activity slowly      Call MD for:  persistant nausea and vomiting      Call MD for:  redness, tenderness, or signs of infection (pain, swelling, redness, odor or green/yellow discharge around incision site)      Call MD for:  difficulty breathing, headache or visual disturbances      Call MD for:  hives      Call MD for:  extreme fatigue          Medication List     As of 02/17/2012  9:26 AM    STOP taking these medications         insulin NPH 100 UNIT/ML injection   Commonly known as: HUMULIN N,NOVOLIN N      TAKE these medications         aspirin 81 MG chewable tablet   Chew 81 mg by mouth daily.      clopidogrel 75 MG tablet   Commonly known as: PLAVIX   Take 75 mg by mouth daily.      furosemide 40 MG tablet   Commonly known as: LASIX   Take 0.5 tablets (20 mg total) by mouth every other day.       gabapentin 300 MG capsule   Commonly known as: NEURONTIN   Take 300 mg by mouth 3 (three) times daily.      ICAPS PO   Take 1 tablet by mouth daily.      insulin glargine 100 UNIT/ML injection   Commonly known as: LANTUS   Inject 20 Units into the skin at bedtime.      isosorbide mononitrate 30 MG 24 hr tablet   Commonly known as: IMDUR   Take 30 mg by mouth daily.      levothyroxine 25 MCG tablet   Commonly known as: SYNTHROID, LEVOTHROID   Take 25 mcg by mouth daily.      metoprolol tartrate 25 MG tablet   Commonly known  as: LOPRESSOR   Take 25 mg by mouth 2 (two) times daily.      nitroGLYCERIN 0.4 MG SL tablet   Commonly known as: NITROSTAT   Place 0.4 mg under the tongue every 5 (five) minutes as needed. Chest pain      oxybutynin 10 MG 24 hr tablet   Commonly known as: DITROPAN-XL   Take 10 mg by mouth every other day.      simvastatin 20 MG tablet   Commonly known as: ZOCOR   Take 20 mg by mouth every evening.             Follow-up Information    Follow up with Ginette Otto, MD. Schedule an appointment as soon as possible for a visit in 1 week.   Contact information:   [address] [place] [address] [phone]           The results of significant diagnostics from this hospitalization (including imaging, microbiology, ancillary and laboratory) are listed below for reference.    Significant Diagnostic Studies: Ct Head Wo Contrast  02/15/2012  *RADIOLOGY REPORT*  Clinical Data: Fall because of lower extremity weakness.  CT HEAD WITHOUT CONTRAST  Technique:  Contiguous axial images were obtained from the base of the skull through the vertex without contrast.  Comparison: 09/03/2011  Findings: The brain shows chronic generalized atrophy.  There are extensive chronic small vessel changes throughout the hemispheric deep white matter, with focal infarction in the left basal ganglia. No sign of acute infarction, mass  lesion, hemorrhage, hydrocephalus or extra-axial collection.  No skull fracture.  No fluid in the sinuses, middle ears or mastoids.  There is extensive atherosclerotic calcification of the major vessels at the base of the brain.  IMPRESSION: Atrophy and chronic small vessel disease.  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  No acute or reversible findings.   Original Report Authenticated By: Paulina Fusi, M.D.    Dg Knee 4 Views W/patella Left  02/16/2012  *RADIOLOGY REPORT*  Clinical Data: To place Left knee pain s/p fall  LEFT KNEE - COMPLETE 4+ VIEW  Comparison: None.  Findings: Marginal spurring noted in the medial compartment and probably along the intercondylar notch.  Patellofemoral spurring is present.  Atherosclerosis in the SFA and popliteal artery noted.  No knee effusion observed.  IMPRESSION:  1.  Degenerative spurring, without acute findings.   Original Report Authenticated By: Gaylyn Rong, M.D.     Microbiology: Recent Results (from the past 240 hour(s))  MRSA PCR SCREENING     Status: Normal   Collection Time   02/16/12  2:21 AM      Component Value Range Status Comment   MRSA by PCR NEGATIVE  NEGATIVE Final      Labs: Basic Metabolic Panel:  Lab 02/16/12 0102 02/16/12 0204 02/15/12 2021 02/15/12 1748 02/15/12 1539  NA 142 137 139 137 133*  K 4.6 4.2 4.5 4.4 5.9*  CL 108 105 104 102 98  CO2 21 22 23 24 25   GLUCOSE 229* 176* 147* 337* 493*  BUN 32* 36* 38* 40* 43*  CREATININE 1.57* 1.66* 1.74* 1.80* 1.89*  CALCIUM 9.0 9.0 9.7 9.6 9.9  MG -- -- -- -- --  PHOS -- -- -- -- --   Liver Function Tests:  Lab 02/15/12 1539  AST 19  ALT 16  ALKPHOS 119*  BILITOT 1.1  PROT 7.0  ALBUMIN 3.6   No results found for this basename: LIPASE:5,AMYLASE:5 in the last 168 hours  No results found for this basename: AMMONIA:5 in the last 168 hours CBC:  Lab 02/16/12 0901 02/15/12 1539  WBC 9.6 12.1*  NEUTROABS -- 8.3*  HGB 13.7 14.2  HCT 39.6 41.7  MCV 86.8 87.8  PLT 159 151    Cardiac Enzymes:  Lab 02/16/12 0900 02/16/12 0203 02/15/12 1836  CKTOTAL 183* 183* 171  CKMB 3.3 3.5 3.6  CKMBINDEX -- -- --  TROPONINI <0.30 <0.30 <0.30   BNP: BNP (last 3 results)  Basename 03/22/11 1555  PROBNP 127.0   CBG:  Lab 02/17/12 0752 02/16/12 2122 02/16/12 1712 02/16/12 1231 02/16/12 0807  GLUCAP 152* 101* 133* 191* 156*       Signed:  Rhetta Mura  [facility] 02/17/2012, 9:16 AM

## 2012-02-17 NOTE — Progress Notes (Signed)
Clinical Social Work Department BRIEF PSYCHOSOCIAL ASSESSMENT 02/17/2012  Patient:  [name], [name]     Account Number:  [id number]     Admit date:  02/15/2012  Clinical Social Worker:  Dennison Bulla  Date/Time:  02/17/2012 12:15 PM  Referred by:  Physician  Date Referred:  02/17/2012 Referred for  SNF Placement   Other Referral:   Interview type:  Patient Other interview type:    PSYCHOSOCIAL DATA Living Status:  ALONE Admitted from facility:   Level of care:   Primary support name:  Mary Primary support relationship to patient:  FAMILY Degree of support available:   Strong    CURRENT CONCERNS Current Concerns  Post-Acute Placement   Other Concerns:    SOCIAL WORK ASSESSMENT / PLAN CSW received referral to assist with SNF placement. CSW reviewed chart and spoke with PT who recommends SNF. CSW met with patient, niece and friend at bedside. Patient agreeable to family involvement.    CSW introduced myself and explained role. Niece reports she and patient discussed SNF and patient is agreeable. CSW spoke with patient and family regarding SNF options and explained that Medicare would not cover SNF stay due to not having a qualifying stay. Family understanding of process and reports that patient cannot afford to pay privately for SNF. CSW offered for patient to consider [facility] but patient is not interested. Family agreeable to support patient and agreeable to [facility]. CM aware of HH needs.    CSW is signing off but available if needed.   Assessment/plan status:  No Further Intervention Required Other assessment/ plan:   Information/referral to community resources:   SNF information  [facility] information    PATIENT'S/FAMILY'S RESPONSE TO PLAN OF CARE: Patient alert and oriented. Patient and family engaged throughout assessment Patient and family agreeable to CSW consult and understanding of Medicare benefits.

## 2012-02-17 NOTE — Care Management Note (Signed)
    Page 1 of 2   02/17/2012     12:10:44 PM   CARE MANAGEMENT NOTE 02/17/2012  Patient:  [name], [name]   Account Number:  [id number]  Date Initiated:  02/16/2012  Documentation initiated by:  Donn Pierini  Subjective/Objective Assessment:   Pt admitted wtih hyperglycemic     Action/Plan:   PTA pt lived at home alone, has an aide that come to assist-  referral to be made to [facility] to see if they can follow pt at discharge   Anticipated DC Date:  02/18/2012   Anticipated DC Plan:  HOME W HOME HEALTH SERVICES  In-house referral  Clinical Social Worker      DC Associate Professor  CM consult      [facility] Choice  HOME HEALTH   Choice offered to / List presented to:  C-1 Patient        HH arranged  HH-1 RN  HH-2 PT  HH-3 OT  HH-4 NURSE'S AIDE  HH-6 SOCIAL WORKER      HH agency  [facility]   Status of service:  Completed, signed off Medicare Important Message given?   (If response is "NO", the following Medicare IM given date fields will be blank) Date Medicare IM given:   Date Additional Medicare IM given:    Discharge Disposition:  HOME W HOME HEALTH SERVICES  Per UR Regulation:  Reviewed for med. necessity/level of care/duration of stay  If discussed at Long Length of Stay Meetings, dates discussed:    Comments:  02/17/12 10:54 [place] Ladona Ridgel RN, BSN [phone] patient lives alone, patient was asking about snf, CSW to talk to patient and physical therapy recs are for snf, but if she chooses to go home she would like [facility], await decision for hh or snf.  Per floor RN, patient has been discharged so [facility] services have been set up for patient, CSW will speak with neice regarding her wanting patient to go to snf, which patient does not have a 3 day stay etc. Referral was made to [facility], Hilda Lias notified for [facility], PT, OT, aide and CSW.  Soc will begin 24-48 hrs post discharge.

## 2012-02-17 NOTE — Progress Notes (Signed)
[facility]  Patient Status: New  [facility] is providing the following services: RN, PT, OT, MSW and HHA  If patient discharges after hours, please call [phone].   Wynelle Bourgeois 02/17/2012, 11:42 AM

## 2012-02-17 NOTE — Progress Notes (Signed)
Pt. discharged to floor,verbalized understanding of discharged instruction,medication,restriction,diet and follow up appointment.Baseline Vitals sign stable,Pt comfortable,no sign and symptom of distress. 

## 2012-08-14 ENCOUNTER — Other Ambulatory Visit: Payer: Self-pay | Admitting: Dermatology

## 2012-08-28 ENCOUNTER — Encounter: Payer: Self-pay | Admitting: *Deleted

## 2012-08-29 ENCOUNTER — Encounter: Payer: Self-pay | Admitting: Cardiovascular Disease

## 2012-08-29 ENCOUNTER — Ambulatory Visit (INDEPENDENT_AMBULATORY_CARE_PROVIDER_SITE_OTHER): Payer: Medicare Other | Admitting: Cardiovascular Disease

## 2012-08-29 VITALS — BP 140/70 | HR 67 | Ht 63.0 in | Wt 194.0 lb

## 2012-08-29 DIAGNOSIS — I2510 Atherosclerotic heart disease of native coronary artery without angina pectoris: Secondary | ICD-10-CM

## 2012-08-29 DIAGNOSIS — I10 Essential (primary) hypertension: Secondary | ICD-10-CM

## 2012-08-29 DIAGNOSIS — E785 Hyperlipidemia, unspecified: Secondary | ICD-10-CM

## 2012-08-29 NOTE — Assessment & Plan Note (Signed)
Followed by her primary care doctor, Dr. Pete Glatter

## 2012-08-29 NOTE — Patient Instructions (Signed)
Your physician wants you to follow-up in: 12 months with Dr Berry. You will receive a reminder letter in the mail two months in advance. If you don't receive a letter, please call our office to schedule the follow-up appointment.  

## 2012-08-29 NOTE — Assessment & Plan Note (Signed)
The patient is stable from a cardiac point of view and denies chest pain or shortness of breath

## 2012-08-29 NOTE — Progress Notes (Signed)
08/29/2012 [name]   1929-01-29  [id number]  Primary Physician Natasha Otto, MD Primary Cardiologist: Natasha Gess MD [name]   HPI:  The patient is an 79 year old, mildly overweight, single Caucasian female with no children whom I last saw in the office on January 10, 2011. She lives at the [facility]. Her problems include continued tobacco abuse, hypertension, and hyperlipidemia. The patient has had mini strokes in the past. She has known CAD status post RCA stenting by Dr. Jacinto Higgins on October 16, 2010, with normal LV function at that time. She was catheterized by Dr. Royann Higgins in March 2011. I put in two Promus drug-eluting stents at that time with an excellent result. Dr. Tresa Higgins re-catheterized her on May 28, 2010, revealing patent stents with normal LV function. She was readmitted with chest pain and had a non-ST-segment elevation myocardial infraction in June of 2012 and was re-catheterized revealing occluded RCA which was re-stented using two Promus drug-eluting stents. At that time, she was found to be bradycardic, and a pacemaker was placed by Dr. Royann Higgins. She saw Dr. Royann Higgins back in August for a pacer check. She was readmitted to [facility] on December 18th through 21st with shortness of breath and chest pain, which she developed while shopping. A Myoview stress test was performed on December 19 which was normal. Her medicines were adjusted, and she was discharged home. She has had no recurrent symptoms. She was put on low-dose diuretic.since I saw her last she denies chest pain or shortness of breath. She has mild lotion edema.      Current Outpatient Prescriptions  Medication Sig Dispense Refill  . aspirin 81 MG chewable tablet Chew 81 mg by mouth daily.        . clopidogrel (PLAVIX) 75 MG tablet Take 75 mg by mouth daily.      . furosemide (LASIX) 40 MG tablet Take 0.5 tablets (20 mg total) by mouth every other day.  30 tablet    . gabapentin  (NEURONTIN) 300 MG capsule Take 300 mg by mouth 3 (three) times daily.        . insulin glargine (LANTUS) 100 UNIT/ML injection Inject 20 Units into the skin at bedtime.  10 mL  0  . levothyroxine (SYNTHROID, LEVOTHROID) 25 MCG tablet Take 25 mcg by mouth daily.        . metoprolol tartrate (LOPRESSOR) 25 MG tablet Take 25 mg by mouth 2 (two) times daily.        . Multiple Vitamins-Minerals (ICAPS PO) Take 1 tablet by mouth daily.        . nitroGLYCERIN (NITROSTAT) 0.4 MG SL tablet Place 0.4 mg under the tongue every 5 (five) minutes as needed. Chest pain       . oxybutynin (DITROPAN-XL) 10 MG 24 hr tablet Take 10 mg by mouth every other day.       . simvastatin (ZOCOR) 20 MG tablet Take 20 mg by mouth every evening.      . isosorbide mononitrate (IMDUR) 30 MG 24 hr tablet Take 30 mg by mouth daily.       No current facility-administered medications for this visit.    No Known Allergies  History   Social History  . Marital Status: Single    Spouse Name: N/A    Number of Children: N/A  . Years of Education: N/A   Occupational History  . Not on file.   Social History Main Topics  . Smoking status: Former Smoker  Quit date: 03/01/2012  . Smokeless tobacco: Not on file  . Alcohol Use: No  . Drug Use: No  . Sexually Active: No   Other Topics Concern  . Not on file   Social History Narrative   Patient lives alone at home.   Next of kin and have her power of attorney is [name] at phone number listed     Review of Systems: General: negative for chills, fever, night sweats or weight changes.  Cardiovascular: negative for chest pain, dyspnea on exertion, edema, orthopnea, palpitations, paroxysmal nocturnal dyspnea or shortness of breath Dermatological: negative for rash Respiratory: negative for cough or wheezing Urologic: negative for hematuria Abdominal: negative for nausea, vomiting, diarrhea, bright red blood per rectum, melena, or hematemesis Neurologic: negative  for visual changes, syncope, or dizziness All other systems reviewed and are otherwise negative except as noted above.    Blood pressure 140/70, pulse 67, height 5\' 3"  (1.6 m), weight 194 lb (87.998 kg).  General appearance: alert and no distress Neck: no adenopathy, no carotid bruit, no JVD, supple, symmetrical, trachea midline and thyroid not enlarged, symmetric, no tenderness/mass/nodules Lungs: clear to auscultation bilaterally Heart: regular rate and rhythm, S1, S2 normal, no murmur, click, rub or gallop Extremities: trace bilateral lower extremity edema  EKG normal sinus rhythm at 67, atrially paced  ASSESSMENT AND PLAN:   CAD (coronary artery disease) The patient is stable from a cardiac point of view and denies chest pain or shortness of breath  Hyperlipidemia Followed by her primary care doctor, Dr. Pete Higgins  Hypertension Controlled on current medications      Natasha Gess MD [name] [facility] 08/29/2012 12:40 PM

## 2012-08-29 NOTE — Assessment & Plan Note (Signed)
Controlled on current medications 

## 2012-10-04 ENCOUNTER — Other Ambulatory Visit: Payer: Self-pay

## 2012-10-04 MED ORDER — ISOSORBIDE MONONITRATE ER 30 MG PO TB24: 30.0000 mg | ORAL_TABLET | Freq: Every day | ORAL | Status: DC

## 2012-10-04 NOTE — Telephone Encounter (Signed)
Rx was sent to pharmacy electronically. 

## 2012-10-24 ENCOUNTER — Telehealth: Payer: Self-pay | Admitting: Cardiovascular Disease

## 2012-10-24 MED ORDER — NITROGLYCERIN 0.4 MG SL SUBL: 0.4000 mg | SUBLINGUAL_TABLET | SUBLINGUAL | Status: DC | PRN

## 2012-10-24 NOTE — Telephone Encounter (Signed)
Refills sent to pharmacy. 

## 2012-10-24 NOTE — Telephone Encounter (Signed)
Need a new prescription for nitrogylcerin.Didnt know the mg

## 2012-10-30 ENCOUNTER — Other Ambulatory Visit: Payer: Self-pay | Admitting: Cardiovascular Disease

## 2012-10-30 DIAGNOSIS — I441 Atrioventricular block, second degree: Secondary | ICD-10-CM

## 2012-10-30 DIAGNOSIS — I498 Other specified cardiac arrhythmias: Secondary | ICD-10-CM

## 2012-11-02 ENCOUNTER — Encounter: Payer: Self-pay | Admitting: *Deleted

## 2012-11-02 LAB — REMOTE PACEMAKER DEVICE
AL IMPEDENCE PM: 530 Ohm
BAMS-0001: 150 {beats}/min
BATTERY VOLTAGE: 2.79 V
RV LEAD AMPLITUDE: 16 mv
VENTRICULAR PACING PM: 0.4

## 2012-11-07 ENCOUNTER — Other Ambulatory Visit: Payer: Self-pay | Admitting: Cardiovascular Disease

## 2012-11-07 NOTE — Telephone Encounter (Signed)
Rx was sent to pharmacy electronically. 

## 2012-11-23 ENCOUNTER — Encounter: Payer: Self-pay | Admitting: Cardiovascular Disease

## 2012-11-23 ENCOUNTER — Ambulatory Visit: Payer: Medicare Other | Admitting: Cardiovascular Disease

## 2012-11-23 ENCOUNTER — Ambulatory Visit (INDEPENDENT_AMBULATORY_CARE_PROVIDER_SITE_OTHER): Payer: Medicare Other | Admitting: Cardiovascular Disease

## 2012-11-23 VITALS — BP 152/80 | HR 69 | Ht 62.5 in | Wt 190.2 lb

## 2012-11-23 DIAGNOSIS — E785 Hyperlipidemia, unspecified: Secondary | ICD-10-CM

## 2012-11-23 DIAGNOSIS — I2510 Atherosclerotic heart disease of native coronary artery without angina pectoris: Secondary | ICD-10-CM

## 2012-11-23 DIAGNOSIS — I509 Heart failure, unspecified: Secondary | ICD-10-CM

## 2012-11-23 DIAGNOSIS — I10 Essential (primary) hypertension: Secondary | ICD-10-CM

## 2012-11-23 NOTE — Patient Instructions (Addendum)
Remote monitoring is used to monitor your Pacemaker of ICD from home. This monitoring reduces the number of office visits required to check your device to one time per year. It allows [place] to keep an eye on the functioning of your device to ensure it is working properly. You are scheduled for a device check from home on 01-31-2013. You may send your transmission at any time that day. If you have a wireless device, the transmission will be sent automatically. After your physician reviews your transmission, you will receive a postcard with your next transmission date.  Your physician recommends that you schedule a follow-up appointment in: 12 months   Follow up with Dr.Berry as scheduled.

## 2012-11-28 ENCOUNTER — Other Ambulatory Visit: Payer: Self-pay | Admitting: Cardiology

## 2012-11-28 NOTE — Telephone Encounter (Signed)
Rx was sent to pharmacy electronically. 

## 2012-12-03 NOTE — Progress Notes (Signed)
Patient ID: [name], female   DOB: 05/04/1928, 79 y.o.   MRN: [id number]     Reason for office visit Pacemaker followup, CAD  [name] falls [name] for problems with coronary disease and is here today to followup on her dual chamber permanent pacemaker. This is a Medtronic Adapta L implanted in June of 2012 for symptomatic bradycardia due to sinus node dysfunction.   She has normal atrioventricular conduction. She has been monitored via the remote care link system and has had no recorded sustained tachyarrhythmia since a 35 second episode of atrial tachycardia that occurred in April. Her last check was a care link download July 29. Lead and battery parameters are all normal. Please refer to the separate note.    No Known Allergies  Current Outpatient Prescriptions  Medication Sig Dispense Refill  . aspirin 81 MG chewable tablet Chew 81 mg by mouth daily.        . clopidogrel (PLAVIX) 75 MG tablet TAKE ONE TABLET BY MOUTH DAILY  30 tablet  9  . furosemide (LASIX) 40 MG tablet Take 0.5 tablets (20 mg total) by mouth every other day.  30 tablet    . gabapentin (NEURONTIN) 300 MG capsule Take 300 mg by mouth 3 (three) times daily.        . insulin glargine (LANTUS) 100 UNIT/ML injection Inject 20 Units into the skin at bedtime.  10 mL  0  . levothyroxine (SYNTHROID, LEVOTHROID) 25 MCG tablet Take 25 mcg by mouth daily.        . metoprolol tartrate (LOPRESSOR) 25 MG tablet Take 25 mg by mouth 2 (two) times daily.        . Multiple Vitamins-Minerals (ICAPS PO) Take 1 tablet by mouth daily.        . nitroGLYCERIN (NITROSTAT) 0.4 MG SL tablet Place 1 tablet (0.4 mg total) under the tongue every 5 (five) minutes as needed. Chest pain  25 tablet  1  . oxybutynin (DITROPAN-XL) 10 MG 24 hr tablet Take 10 mg by mouth every other day.       . simvastatin (ZOCOR) 20 MG tablet Take 20 mg by mouth every evening.      . isosorbide mononitrate (IMDUR) 30 MG 24 hr tablet TAKE 1 TABLET  BY MOUTH DAILY  30 tablet  11   No current facility-administered medications for this visit.    Past Medical History  Diagnosis Date  . CHF (congestive heart failure)   . Coronary artery disease     multiple PCI; echo 09/15/10-EF45-50%, aortic valve sclerosis without stenosis; myoview 03/23/11-no reversible defects, EF66%  . Hypertension   . Pacemaker 09/2010    sinus node dysfunction, sinus bradycardia  . Hyperlipidemia   . Hypothyroidism   . Insulin dependent diabetes mellitus   . Stroke about 20 years ago     "left sided weakness at times"  . Carotid bruit 10/25/2002    carotid doppler- normal    Past Surgical History  Procedure Laterality Date  . Insert / replace / remove pacemaker  09/21/10    medtronic Adapta   . Coronary angioplasty with stent placement  09/13/10    preserved LV function, PTCA/stenting of the RCA with insertion of two stents, 2.75x31mm stent proximal RCA and 2.25x42mm at the distal RCA, post stent dilatation up to 2.95mm  . Cardiac catheterization  05/28/10    EF 65%, medical therapy  . Coronary angioplasty with stent placement  06/04/09    PCI, stenting of the  mid and prox RCA with Promus DES  . Cardiac catheterization  06/03/09    PCI, stenting of the major oblique marginal arteries off the circ  . Coronary angioplasty with stent placement  10/16/2002    stenting of mid RCA with 2.75x52mm Cypher stent deployed at 16 atmospheres; EF 60  . Coronary angioplasty with stent placement  11/29/2001    2.75x54mm cutting balloon angioplasty of the in stent restenosis of RCA, bracytherapy of the in stend restenotic segment of mid RCA  . Cardiac catheterization  11/28/2001    high grade isten restenosis of RCA stent placed 03/02/01, proceed with cutting balloon 8/28  . Coronary angioplasty with stent placement  03/02/2001    ptca and stenting of RCA (2.75x6mm Ceta) after predilatation with a 2.5x18mm CrossSail and OpenSail balloon; EF 60%    Family History  Problem  Relation Age of Onset  . Asthma Mother   . Diabetes Father   . Heart failure Father     History   Social History  . Marital Status: Single    Spouse Name: N/A    Number of Children: N/A  . Years of Education: N/A   Occupational History  . Not on file.   Social History Main Topics  . Smoking status: Former Smoker    Quit date: 03/01/2012  . Smokeless tobacco: Not on file  . Alcohol Use: No  . Drug Use: No  . Sexual Activity: No   Other Topics Concern  . Not on file   Social History Narrative   Patient lives alone at home.   Next of kin and have her power of attorney is Heloise Purpura at phone number listed    Review of systems: The patient specifically denies any chest pain at rest or with exertion, dyspnea at rest or with exertion, orthopnea, paroxysmal nocturnal dyspnea, syncope, palpitations, focal neurological deficits, intermittent claudication, lower extremity edema, unexplained weight gain, cough, hemoptysis or wheezing.  The patient also denies abdominal pain, nausea, vomiting, dysphagia, diarrhea, constipation, polyuria, polydipsia, dysuria, hematuria, frequency, urgency, abnormal bleeding or bruising, fever, chills, unexpected weight changes, mood swings, change in skin or hair texture, change in voice quality, auditory or visual problems, allergic reactions or rashes, new musculoskeletal complaints other than usual "aches and pains".   PHYSICAL EXAM BP 152/80  Pulse 69  Ht 5' 2.5" (1.588 m)  Wt 190 lb 3.2 oz (86.274 kg)  BMI 34.21 kg/m2  General: Alert, oriented x3, no distress Head: no evidence of trauma, PERRL, EOMI, no exophtalmos or lid lag, no myxedema, no xanthelasma; normal ears, nose and oropharynx Neck: normal jugular venous pulsations and no hepatojugular reflux; brisk carotid pulses without delay and no carotid bruits Chest: clear to auscultation, no signs of consolidation by percussion or palpation, normal fremitus, symmetrical and full respiratory  excursions Cardiovascular: normal position and quality of the apical impulse, regular rhythm, normal first and second heart sounds, no murmurs, rubs or gallops Abdomen: no tenderness or distention, no masses by palpation, no abnormal pulsatility or arterial bruits, normal bowel sounds, no hepatosplenomegaly Extremities: no clubbing, cyanosis or edema; 2+ radial, ulnar and brachial pulses bilaterally; 2+ right femoral, posterior tibial and dorsalis pedis pulses; 2+ left femoral, posterior tibial and dorsalis pedis pulses; no subclavian or femoral bruits Neurological: grossly nonfocal, quite hard of hearing   EKG: atrial paced ventricular sensed otherwise normal   Lipid Panel     Component Value Date/Time   CHOL 128 03/23/2011 0618   TRIG 126 03/23/2011 0618  HDL 59 03/23/2011 0618   CHOLHDL 2.2 03/23/2011 0618   VLDL 25 03/23/2011 0618   LDLCALC 44 03/23/2011 0618    BMET    Component Value Date/Time   NA 142 02/16/2012 0901   K 4.6 02/16/2012 0901   CL 108 02/16/2012 0901   CO2 21 02/16/2012 0901   GLUCOSE 229* 02/16/2012 0901   BUN 32* 02/16/2012 0901   CREATININE 1.57* 02/16/2012 0901   CALCIUM 9.0 02/16/2012 0901   GFRNONAA 30* 02/16/2012 0901   GFRAA 35* 02/16/2012 0901     ASSESSMENT AND PLAN Normal dual chamber permanent pacemaker function Continue CareLink checks every 3 months  Coronary artery disease Not currently symptomatic. Her most recent event was a non-ST segment elevation myocardial infarction 2012 when she was found to have an occluded right coronary artery and received 2 drug-eluting stents. Her resting electrocardiogram today is normal  Hypertension Slightly high BP on presentation today. Recheck was still little elevated at 142/76. No changes were made her medications on this visit. Note the presence of chronic kidney disease. Target blood pressure should be around 130/70  CHF (congestive heart failure) chronic and stable Clinically euvolemic, [place] functional class I (rather sedentary). Receiving loop diuretics and a low dose, beta blockers.. Note mildly depressed left ventricular systolic function. Consider adding an angiotensin receptor blocker or ACE inhibitor, but will require close monitoring of renal function  Hyperlipidemia Good lipid parameters on the current regime,n no changes recommended  Orders Placed This Encounter  Procedures  . EKG 12-Lead   Aleece Loyd  Thurmon Fair, MD, [facility] [phone] office [phone] pager

## 2012-12-03 NOTE — Assessment & Plan Note (Signed)
Slightly high BP on presentation today. Recheck was still little elevated at 142/76. No changes were made her medications on this visit. Note the presence of chronic kidney disease. Target blood pressure should be around 130/70

## 2012-12-03 NOTE — Assessment & Plan Note (Signed)
Good lipid parameters on the current regime,n no changes recommended

## 2012-12-03 NOTE — Assessment & Plan Note (Signed)
Clinically euvolemic, [place] functional class I (rather sedentary). Receiving loop diuretics and a low dose, beta blockers.. Note mildly depressed left ventricular systolic function. Consider adding an angiotensin receptor blocker or ACE inhibitor, but will require close monitoring of renal function

## 2012-12-03 NOTE — Assessment & Plan Note (Signed)
Not currently symptomatic. Her most recent event was a non-ST segment elevation myocardial infarction 2012 when she was found to have an occluded right coronary artery and received 2 drug-eluting stents. Her resting electrocardiogram today is normal

## 2013-02-06 ENCOUNTER — Ambulatory Visit (INDEPENDENT_AMBULATORY_CARE_PROVIDER_SITE_OTHER): Payer: Medicare Other

## 2013-02-06 DIAGNOSIS — I498 Other specified cardiac arrhythmias: Secondary | ICD-10-CM

## 2013-02-06 DIAGNOSIS — I441 Atrioventricular block, second degree: Secondary | ICD-10-CM

## 2013-02-06 LAB — PACEMAKER DEVICE OBSERVATION

## 2013-02-18 ENCOUNTER — Encounter: Payer: Self-pay | Admitting: *Deleted

## 2013-02-18 LAB — MDC_IDC_ENUM_SESS_TYPE_REMOTE
Battery Remaining Longevity: 12.5
Brady Statistic AP VP Percent: 0 %
Brady Statistic AP VS Percent: 46 %
Brady Statistic AS VP Percent: 0 %
Brady Statistic AS VS Percent: 54 %
Lead Channel Impedance Value: 558 Ohm
Lead Channel Pacing Threshold Amplitude: 0.5 V
Lead Channel Pacing Threshold Amplitude: 0.875 V
Lead Channel Pacing Threshold Pulse Width: 0.4 ms
Lead Channel Sensing Intrinsic Amplitude: 16 mV
Lead Channel Sensing Intrinsic Amplitude: 2.8 mV
Lead Channel Setting Pacing Pulse Width: 0.4 ms

## 2013-05-10 ENCOUNTER — Ambulatory Visit (INDEPENDENT_AMBULATORY_CARE_PROVIDER_SITE_OTHER): Payer: Medicare Other | Admitting: *Deleted

## 2013-05-10 ENCOUNTER — Encounter: Payer: Self-pay | Admitting: Cardiovascular Disease

## 2013-05-10 DIAGNOSIS — I441 Atrioventricular block, second degree: Secondary | ICD-10-CM

## 2013-05-10 LAB — MDC_IDC_ENUM_SESS_TYPE_REMOTE
Brady Statistic AP VS Percent: 46.5 %
Brady Statistic AS VP Percent: 0.3 %
Brady Statistic AS VS Percent: 52.9 %
Lead Channel Impedance Value: 487 Ohm
Lead Channel Pacing Threshold Amplitude: 0.5 V
Lead Channel Pacing Threshold Amplitude: 0.875 V
Lead Channel Pacing Threshold Pulse Width: 0.4 ms
Lead Channel Sensing Intrinsic Amplitude: 2.8 mV
MDC IDC MSMT LEADCHNL RV IMPEDANCE VALUE: 518 Ohm
MDC IDC MSMT LEADCHNL RV PACING THRESHOLD PULSEWIDTH: 0.4 ms
MDC IDC MSMT LEADCHNL RV SENSING INTR AMPL: 22.4 mV
MDC IDC STAT BRADY AP VP PERCENT: 0.3 %

## 2013-06-03 ENCOUNTER — Encounter: Payer: Self-pay | Admitting: *Deleted

## 2013-06-05 ENCOUNTER — Other Ambulatory Visit (HOSPITAL_COMMUNITY): Payer: Self-pay | Admitting: Cardiology

## 2013-06-05 DIAGNOSIS — I743 Embolism and thrombosis of arteries of the lower extremities: Secondary | ICD-10-CM

## 2013-06-06 ENCOUNTER — Encounter (HOSPITAL_COMMUNITY): Payer: Medicare Other

## 2013-06-11 ENCOUNTER — Ambulatory Visit (HOSPITAL_COMMUNITY): Payer: Medicare Other | Attending: Geriatric Medicine | Admitting: Cardiology

## 2013-06-11 DIAGNOSIS — R0989 Other specified symptoms and signs involving the circulatory and respiratory systems: Secondary | ICD-10-CM

## 2013-06-11 DIAGNOSIS — I743 Embolism and thrombosis of arteries of the lower extremities: Secondary | ICD-10-CM | POA: Insufficient documentation

## 2013-06-11 DIAGNOSIS — I739 Peripheral vascular disease, unspecified: Secondary | ICD-10-CM

## 2013-06-11 DIAGNOSIS — E1159 Type 2 diabetes mellitus with other circulatory complications: Secondary | ICD-10-CM

## 2013-06-11 NOTE — Progress Notes (Signed)
Lower arterial doppler complete

## 2013-06-13 ENCOUNTER — Encounter (HOSPITAL_COMMUNITY): Payer: Self-pay | Admitting: Interventional Cardiology

## 2013-06-13 ENCOUNTER — Other Ambulatory Visit (HOSPITAL_COMMUNITY): Payer: Self-pay

## 2013-06-18 ENCOUNTER — Other Ambulatory Visit: Payer: Self-pay

## 2013-06-18 MED ORDER — NITROGLYCERIN 0.4 MG SL SUBL: 0.4000 mg | SUBLINGUAL_TABLET | SUBLINGUAL | Status: AC | PRN

## 2013-06-18 NOTE — Telephone Encounter (Signed)
Rx was sent to pharmacy electronically. 

## 2013-08-12 ENCOUNTER — Ambulatory Visit (INDEPENDENT_AMBULATORY_CARE_PROVIDER_SITE_OTHER): Payer: Medicare Other | Admitting: *Deleted

## 2013-08-12 ENCOUNTER — Encounter: Payer: Self-pay | Admitting: Cardiovascular Disease

## 2013-08-12 DIAGNOSIS — R001 Bradycardia, unspecified: Secondary | ICD-10-CM

## 2013-08-12 DIAGNOSIS — I441 Atrioventricular block, second degree: Secondary | ICD-10-CM

## 2013-08-12 DIAGNOSIS — I498 Other specified cardiac arrhythmias: Secondary | ICD-10-CM

## 2013-08-12 NOTE — Progress Notes (Signed)
Remote pacemaker transmission.   

## 2013-08-15 LAB — MDC_IDC_ENUM_SESS_TYPE_REMOTE
Battery Impedance: 157 Ohm
Battery Remaining Longevity: 148 mo
Battery Voltage: 2.8 V
Brady Statistic AS VP Percent: 0 %
Date Time Interrogation Session: 20150511135519
Lead Channel Impedance Value: 493 Ohm
Lead Channel Pacing Threshold Amplitude: 0.625 V
Lead Channel Pacing Threshold Amplitude: 0.875 V
Lead Channel Pacing Threshold Pulse Width: 0.4 ms
Lead Channel Setting Pacing Amplitude: 2 V
Lead Channel Setting Pacing Pulse Width: 0.4 ms
MDC IDC MSMT LEADCHNL RA PACING THRESHOLD PULSEWIDTH: 0.4 ms
MDC IDC MSMT LEADCHNL RA SENSING INTR AMPL: 2.8 mV
MDC IDC MSMT LEADCHNL RV IMPEDANCE VALUE: 522 Ohm
MDC IDC MSMT LEADCHNL RV SENSING INTR AMPL: 16 mV
MDC IDC SET LEADCHNL RA PACING AMPLITUDE: 2 V
MDC IDC SET LEADCHNL RV SENSING SENSITIVITY: 5.6 mV
MDC IDC STAT BRADY AP VP PERCENT: 0 %
MDC IDC STAT BRADY AP VS PERCENT: 46 %
MDC IDC STAT BRADY AS VS PERCENT: 53 %

## 2013-08-22 ENCOUNTER — Encounter: Payer: Self-pay | Admitting: Cardiology

## 2013-08-22 ENCOUNTER — Other Ambulatory Visit: Payer: Self-pay | Admitting: *Deleted

## 2013-08-22 MED ORDER — CLOPIDOGREL BISULFATE 75 MG PO TABS: 75.0000 mg | ORAL_TABLET | Freq: Every day | ORAL | Status: DC

## 2013-08-27 ENCOUNTER — Encounter: Payer: Self-pay | Admitting: Cardiovascular Disease

## 2013-08-27 ENCOUNTER — Ambulatory Visit (INDEPENDENT_AMBULATORY_CARE_PROVIDER_SITE_OTHER): Payer: Medicare Other | Admitting: Cardiovascular Disease

## 2013-08-27 VITALS — BP 104/72 | HR 78 | Ht 62.0 in | Wt 189.0 lb

## 2013-08-27 DIAGNOSIS — I10 Essential (primary) hypertension: Secondary | ICD-10-CM

## 2013-08-27 DIAGNOSIS — I2510 Atherosclerotic heart disease of native coronary artery without angina pectoris: Secondary | ICD-10-CM

## 2013-08-27 DIAGNOSIS — E785 Hyperlipidemia, unspecified: Secondary | ICD-10-CM

## 2013-08-27 DIAGNOSIS — I743 Embolism and thrombosis of arteries of the lower extremities: Secondary | ICD-10-CM

## 2013-08-27 DIAGNOSIS — Z950 Presence of cardiac pacemaker: Secondary | ICD-10-CM

## 2013-08-27 NOTE — Progress Notes (Signed)
08/27/2013 [name]   1928/10/09  [id number]  Primary Physician Mathews Argyle, MD Primary Cardiologist: Lorretta Harp MD Renae Gloss   HPI:  The patient is an 80 year old, mildly overweight, single Caucasian female with no children whom I last saw in the office on January 10, 2011. She lives at the [facility] Her problems include continued tobacco abuse, hypertension, and hyperlipidemia. The patient has had mini strokes in the past. She has known CAD status post RCA stenting by Dr. Einar Gip on October 16, 2010, with normal LV function at that time. She was catheterized by Dr. Sallyanne Kuster in March 2011. I put in two Promus drug-eluting stents at that time with an excellent result. Dr. Claiborne Billings re-catheterized her on May 28, 2010, revealing patent stents with normal LV function. She was readmitted with chest pain and had a non-ST-segment elevation myocardial infraction in June of 2012 and was re-catheterized revealing occluded RCA which was re-stented using two Promus drug-eluting stents. At that time, she was found to be bradycardic, and a pacemaker was placed by Dr. Sallyanne Kuster. She saw Dr. Sallyanne Kuster back in August for a pacer check. She was readmitted to [facility] on December 18th through 21st with shortness of breath and chest pain, which she developed while shopping. A Myoview stress test was performed on December 19 which was normal. Her medicines were adjusted, and she was discharged home. She has had no recurrent symptoms. She was put on low-dose diuretic.since I saw her last one year ago  she denies chest pain or shortness of breath.   Current Outpatient Prescriptions  Medication Sig Dispense Refill  . aspirin 81 MG chewable tablet Chew 81 mg by mouth daily.        . clopidogrel (PLAVIX) 75 MG tablet Take 1 tablet (75 mg total) by mouth daily.  30 tablet  9  . furosemide (LASIX) 40 MG tablet Take 0.5 tablets (20 mg total) by mouth every other day.  30 tablet      . gabapentin (NEURONTIN) 300 MG capsule Take 300 mg by mouth 3 (three) times daily.        . insulin glargine (LANTUS) 100 UNIT/ML injection Inject 20 Units into the skin at bedtime.  10 mL  0  . isosorbide mononitrate (IMDUR) 30 MG 24 hr tablet TAKE 1 TABLET BY MOUTH DAILY  30 tablet  11  . levothyroxine (SYNTHROID, LEVOTHROID) 25 MCG tablet Take 25 mcg by mouth daily.        . metoprolol tartrate (LOPRESSOR) 25 MG tablet Take 25 mg by mouth 2 (two) times daily.        . Multiple Vitamins-Minerals (ICAPS PO) Take 1 tablet by mouth daily.        . nitroGLYCERIN (NITROSTAT) 0.4 MG SL tablet Place 1 tablet (0.4 mg total) under the tongue every 5 (five) minutes as needed. Chest pain  25 tablet  3  . oxybutynin (DITROPAN-XL) 10 MG 24 hr tablet Take 10 mg by mouth every other day.       . simvastatin (ZOCOR) 20 MG tablet Take 20 mg by mouth every evening.       No current facility-administered medications for this visit.    No Known Allergies  History   Social History  . Marital Status: Single    Spouse Name: N/A    Number of Children: N/A  . Years of Education: N/A   Occupational History  . Not on file.   Social History Main Topics  .  Smoking status: Former Smoker    Quit date: 03/01/2012  . Smokeless tobacco: Not on file  . Alcohol Use: No  . Drug Use: No  . Sexual Activity: No   Other Topics Concern  . Not on file   Social History Narrative   Patient lives alone at home.   Next of kin and have her power of attorney is Arliss Journey at phone number listed     Review of Systems: General: negative for chills, fever, night sweats or weight changes.  Cardiovascular: negative for chest pain, dyspnea on exertion, edema, orthopnea, palpitations, paroxysmal nocturnal dyspnea or shortness of breath Dermatological: negative for rash Respiratory: negative for cough or wheezing Urologic: negative for hematuria Abdominal: negative for nausea, vomiting, diarrhea, bright red blood per  rectum, melena, or hematemesis Neurologic: negative for visual changes, syncope, or dizziness All other systems reviewed and are otherwise negative except as noted above.    Blood pressure 104/72, pulse 78, height 5\' 2"  (1.575 m), weight 189 lb (85.73 kg).  General appearance: alert and no distress Neck: no adenopathy, no JVD, supple, symmetrical, trachea midline, thyroid not enlarged, symmetric, no tenderness/mass/nodules and soft bilateral carotid bruits Lungs: clear to auscultation bilaterally Heart: regular rate and rhythm, S1, S2 normal, no murmur, click, rub or gallop Extremities: extremities normal, atraumatic, no cyanosis or edema  EKG atrially  paced rhythm at 78  ASSESSMENT AND PLAN:   CAD (coronary artery disease) History of CAD status post RCA stenting 10/16/10. She was recatheterized 05/28/10 revealing patent stents normal LV function. She was remitted again in June 2012 and had restenting of the occluded RCA with 2 Promus overlapping drug-eluting stents. She's had a normal Myoview since that time. She denies chest pain or shortness of breath.  Pacemaker Status post pacemaker insertion by Dr. Sallyanne Kuster for symptomatic bradycardia which he follows using remote [employer] down loads as well as annual office visits.  Hypertension Controlled on current medications  Hyperlipidemia On statin therapy followed by her PCP      Lorretta Harp MD [facility], [facility] 08/27/2013 12:05 PM

## 2013-08-27 NOTE — Patient Instructions (Signed)
Dr Gwenlyn Found wants you to follow-up in 1 year. You will receive a reminder letter in the mail one months in advance. If you don't receive a letter, please call our office to schedule the follow-up appointment.

## 2013-08-27 NOTE — Assessment & Plan Note (Signed)
On statin therapy followed by her PCP 

## 2013-08-27 NOTE — Assessment & Plan Note (Signed)
Controlled on current medications 

## 2013-08-27 NOTE — Assessment & Plan Note (Signed)
History of CAD status post RCA stenting 10/16/10. She was recatheterized 05/28/10 revealing patent stents normal LV function. She was remitted again in June 2012 and had restenting of the occluded RCA with 2 Promus overlapping drug-eluting stents. She's had a normal Myoview since that time. She denies chest pain or shortness of breath.

## 2013-08-27 NOTE — Assessment & Plan Note (Signed)
Status post pacemaker insertion by Dr. Sallyanne Kuster for symptomatic bradycardia which he follows using remote [employer] down loads as well as annual office visits.

## 2013-09-04 ENCOUNTER — Telehealth: Payer: Self-pay | Admitting: Cardiovascular Disease

## 2013-09-04 NOTE — Telephone Encounter (Signed)
The only change in her medicine list is Lantus Insulin she takes 36 units every night,she no longer take the Ditropan-XL.She is also taking Lisinopril 5mg  once a day.Everything is still the same as listed.

## 2013-09-04 NOTE — Telephone Encounter (Signed)
Medication list updated.  Will forward to Dr. Gwenlyn Found as Juluis Rainier for review.

## 2013-09-09 ENCOUNTER — Other Ambulatory Visit: Payer: Self-pay | Admitting: Dermatology

## 2013-11-19 ENCOUNTER — Encounter: Payer: Self-pay | Admitting: Cardiovascular Disease

## 2013-11-19 ENCOUNTER — Ambulatory Visit (INDEPENDENT_AMBULATORY_CARE_PROVIDER_SITE_OTHER): Payer: Medicare Other | Admitting: Cardiovascular Disease

## 2013-11-19 VITALS — BP 134/73 | HR 82 | Resp 16 | Ht 62.0 in | Wt 179.6 lb

## 2013-11-19 DIAGNOSIS — I10 Essential (primary) hypertension: Secondary | ICD-10-CM

## 2013-11-19 DIAGNOSIS — I2510 Atherosclerotic heart disease of native coronary artery without angina pectoris: Secondary | ICD-10-CM

## 2013-11-19 DIAGNOSIS — Z950 Presence of cardiac pacemaker: Secondary | ICD-10-CM

## 2013-11-19 DIAGNOSIS — I743 Embolism and thrombosis of arteries of the lower extremities: Secondary | ICD-10-CM

## 2013-11-19 DIAGNOSIS — I441 Atrioventricular block, second degree: Secondary | ICD-10-CM

## 2013-11-19 DIAGNOSIS — I498 Other specified cardiac arrhythmias: Secondary | ICD-10-CM

## 2013-11-19 NOTE — Patient Instructions (Signed)
Remote monitoring is used to monitor your pacemaker from home. This monitoring reduces the number of office visits required to check your device to one time per year. It allows [place] to keep an eye on the functioning of your device to ensure it is working properly. You are scheduled for a device check from home on 02-20-2014. You may send your transmission at any time that day. If you have a wireless device, the transmission will be sent automatically. After your physician reviews your transmission, you will receive a postcard with your next transmission date.  Your physician recommends that you schedule a follow-up appointment in: 12 months with Dr.Croitoru

## 2013-11-19 NOTE — Progress Notes (Signed)
Reason for office visit Pacemaker check  This is a yearly followup for [name] who sees Dr. Quay Burow for coronary artery disease. She has a dual chamber Medtronic pacemaker implanted in June 2012 for symptomatic sinus bradycardia with a normal AV conduction.  Interrogation of her pacemaker shows normal lead and battery parameters. She has infrequent and brief episodes of paroxysmal atrial tachycardia. The overall burden of arrhythmia is well under 0.1%. The longest episode lasted for 22 minutes. Her underlying rhythm is marked sinus bradycardia. She has roughly 44% atrial pacing and less than 1% ventricular pacing. Heart rate histogram distribution is favorable.   No Known Allergies  Current Outpatient Prescriptions  Medication Sig Dispense Refill  . aspirin 81 MG chewable tablet Chew 81 mg by mouth daily.        . furosemide (LASIX) 40 MG tablet Take 0.5 tablets (20 mg total) by mouth every other day.  30 tablet    . gabapentin (NEURONTIN) 300 MG capsule Take 300 mg by mouth 3 (three) times daily.        . insulin glargine (LANTUS) 100 UNIT/ML injection Inject 36 Units into the skin at bedtime.      . isosorbide mononitrate (IMDUR) 30 MG 24 hr tablet TAKE 1 TABLET BY MOUTH DAILY  30 tablet  11  . levothyroxine (SYNTHROID, LEVOTHROID) 25 MCG tablet Take 25 mcg by mouth daily.        Marland Kitchen lisinopril (PRINIVIL,ZESTRIL) 5 MG tablet Take 5 mg by mouth daily.      . metoprolol tartrate (LOPRESSOR) 25 MG tablet Take 25 mg by mouth 2 (two) times daily.        . Multiple Vitamins-Minerals (ICAPS PO) Take 1 tablet by mouth daily.        . nitroGLYCERIN (NITROSTAT) 0.4 MG SL tablet Place 1 tablet (0.4 mg total) under the tongue every 5 (five) minutes as needed. Chest pain  25 tablet  3  . simvastatin (ZOCOR) 20 MG tablet Take 20 mg by mouth every evening.      . clopidogrel (PLAVIX) 75 MG tablet Take 1 tablet (75 mg total) by mouth daily.  30 tablet  9   No current  facility-administered medications for this visit.    Past Medical History  Diagnosis Date  . CHF (congestive heart failure)   . Coronary artery disease     multiple PCI; echo 09/15/10-EF45-50%, aortic valve sclerosis without stenosis; myoview 03/23/11-no reversible defects, EF66%  . Hypertension   . Pacemaker 09/2010    sinus node dysfunction, sinus bradycardia  . Hyperlipidemia   . Hypothyroidism   . Insulin dependent diabetes mellitus   . Stroke about 20 years ago     "left sided weakness at times"  . Carotid bruit 10/25/2002    carotid doppler- normal    Past Surgical History  Procedure Laterality Date  . Insert / replace / remove pacemaker  09/21/10    medtronic Adapta   . Coronary angioplasty with stent placement  09/13/10    preserved LV function, PTCA/stenting of the RCA with insertion of two stents, 2.75x74mm stent proximal RCA and 2.25x42mm at the distal RCA, post stent dilatation up to 2.31mm  . Cardiac catheterization  05/28/10    EF 65%, medical therapy  . Coronary angioplasty with stent placement  06/04/09    PCI, stenting of the mid and prox RCA with Promus DES  . Cardiac catheterization  06/03/09    PCI, stenting of the major oblique marginal  arteries off the circ  . Coronary angioplasty with stent placement  10/16/2002    stenting of mid RCA with 2.75x79mm Cypher stent deployed at 16 atmospheres; EF 60  . Coronary angioplasty with stent placement  11/29/2001    2.75x15mm cutting balloon angioplasty of the in stent restenosis of RCA, bracytherapy of the in stend restenotic segment of mid RCA  . Cardiac catheterization  11/28/2001    high grade isten restenosis of RCA stent placed 03/02/01, proceed with cutting balloon 8/28  . Coronary angioplasty with stent placement  03/02/2001    ptca and stenting of RCA (2.75x17mm Ceta) after predilatation with a 2.5x79mm CrossSail and OpenSail balloon; EF 60%    Family History  Problem Relation Age of Onset  . Asthma Mother   .  Diabetes Father   . Heart failure Father     History   Social History  . Marital Status: Single    Spouse Name: N/A    Number of Children: N/A  . Years of Education: N/A   Occupational History  . Not on file.   Social History Main Topics  . Smoking status: Former Smoker    Quit date: 03/01/2012  . Smokeless tobacco: Not on file  . Alcohol Use: No  . Drug Use: No  . Sexual Activity: No   Other Topics Concern  . Not on file   Social History Narrative   Patient lives alone at home.   Next of kin and have her power of attorney is Arliss Journey at phone number listed    Review of systems: The patient specifically denies any chest pain at rest or with exertion, dyspnea at rest or with exertion, orthopnea, paroxysmal nocturnal dyspnea, syncope, palpitations, focal neurological deficits, intermittent claudication, lower extremity edema, unexplained weight gain, cough, hemoptysis or wheezing.  The patient also denies abdominal pain, nausea, vomiting, dysphagia, diarrhea, constipation, polyuria, polydipsia, dysuria, hematuria, frequency, urgency, abnormal bleeding or bruising, fever, chills, unexpected weight changes, mood swings, change in skin or hair texture, change in voice quality, auditory or visual problems, allergic reactions or rashes, new musculoskeletal complaints other than usual "aches and pains".   PHYSICAL EXAM BP 134/73  Pulse 82  Resp 16  Ht 5\' 2"  (1.575 m)  Wt 179 lb 9.6 oz (81.466 kg)  BMI 32.84 kg/m2 General: Alert, oriented x3, no distress  Head: no evidence of trauma, PERRL, EOMI, no exophtalmos or lid lag, no myxedema, no xanthelasma; normal ears, nose and oropharynx  Neck: normal jugular venous pulsations and no hepatojugular reflux; brisk carotid pulses without delay and no carotid bruits  Chest: clear to auscultation, no signs of consolidation by percussion or palpation, normal fremitus, symmetrical and full respiratory excursions  Cardiovascular: normal  position and quality of the apical impulse, regular rhythm, normal first and second heart sounds, no murmurs, rubs or gallops  Abdomen: no tenderness or distention, no masses by palpation, no abnormal pulsatility or arterial bruits, normal bowel sounds, no hepatosplenomegaly  Extremities: no clubbing, cyanosis or edema; 2+ radial, ulnar and brachial pulses bilaterally; 2+ right femoral, posterior tibial and dorsalis pedis pulses; 2+ left femoral, posterior tibial and dorsalis pedis pulses; no subclavian or femoral bruits  Neurological: grossly nonfocal, quite hard of hearing    Lipid Panel     Component Value Date/Time   CHOL 128 03/23/2011 0618   TRIG 126 03/23/2011 0618   HDL 59 03/23/2011 0618   CHOLHDL 2.2 03/23/2011 0618   VLDL 25 03/23/2011 0618   LDLCALC 44 03/23/2011  6222    BMET    Component Value Date/Time   NA 142 02/16/2012 0901   K 4.6 02/16/2012 0901   CL 108 02/16/2012 0901   CO2 21 02/16/2012 0901   GLUCOSE 229* 02/16/2012 0901   BUN 32* 02/16/2012 0901   CREATININE 1.57* 02/16/2012 0901   CALCIUM 9.0 02/16/2012 0901   GFRNONAA 30* 02/16/2012 0901   GFRAA 35* 02/16/2012 0901     ASSESSMENT AND PLAN Normal dual chamber permanent pacemaker function  Continue CareLink checks every 3 months , office visit yearly  Coronary artery disease  Noangina Her most recent event was a non-ST segment elevation myocardial infarction 2012 when she was found to have an occluded right coronary artery and received 2 drug-eluting stents.    Hypertension  Good BP control  CHF (congestive heart failure) chronic and stable  Clinically euvolemic, [place] Association functional class I (rather sedentary, uses a walker). Receiving loop diuretics in a low dose, beta blockers and ACE inhibitor.  Holli Humbles, MD, [place] [phone] office [phone] pager

## 2013-11-20 LAB — MDC_IDC_ENUM_SESS_TYPE_INCLINIC
Brady Statistic AP VS Percent: 44 %
Brady Statistic AS VP Percent: 0 %
Brady Statistic AS VS Percent: 55 %
Date Time Interrogation Session: 20150818140138
Lead Channel Impedance Value: 546 Ohm
Lead Channel Pacing Threshold Amplitude: 0.5 V
Lead Channel Pacing Threshold Amplitude: 0.5 V
Lead Channel Pacing Threshold Pulse Width: 0.4 ms
Lead Channel Sensing Intrinsic Amplitude: 22.4 mV
Lead Channel Setting Sensing Sensitivity: 5.6 mV
MDC IDC MSMT BATTERY IMPEDANCE: 157 Ohm
MDC IDC MSMT BATTERY REMAINING LONGEVITY: 150 mo
MDC IDC MSMT BATTERY VOLTAGE: 2.79 V
MDC IDC MSMT LEADCHNL RA IMPEDANCE VALUE: 540 Ohm
MDC IDC MSMT LEADCHNL RA SENSING INTR AMPL: 4 mV
MDC IDC MSMT LEADCHNL RV PACING THRESHOLD PULSEWIDTH: 0.4 ms
MDC IDC SET LEADCHNL RA PACING AMPLITUDE: 2 V
MDC IDC SET LEADCHNL RV PACING AMPLITUDE: 2 V
MDC IDC SET LEADCHNL RV PACING PULSEWIDTH: 0.4 ms
MDC IDC STAT BRADY AP VP PERCENT: 0 %

## 2013-12-06 ENCOUNTER — Other Ambulatory Visit: Payer: Self-pay | Admitting: *Deleted

## 2013-12-06 MED ORDER — ISOSORBIDE MONONITRATE ER 30 MG PO TB24: 30.0000 mg | ORAL_TABLET | Freq: Every day | ORAL | Status: AC

## 2013-12-06 NOTE — Telephone Encounter (Signed)
Rx refill sent to patient pharmacy   

## 2013-12-16 ENCOUNTER — Encounter: Payer: Self-pay | Admitting: Cardiovascular Disease

## 2014-02-20 ENCOUNTER — Ambulatory Visit (INDEPENDENT_AMBULATORY_CARE_PROVIDER_SITE_OTHER): Payer: Medicare Other | Admitting: *Deleted

## 2014-02-20 ENCOUNTER — Telehealth: Payer: Self-pay | Admitting: Cardiology

## 2014-02-20 NOTE — Telephone Encounter (Signed)
LMOVM reminding pt to send remote transmission.   

## 2014-02-21 ENCOUNTER — Encounter: Payer: Self-pay | Admitting: Cardiology

## 2014-02-24 ENCOUNTER — Other Ambulatory Visit (HOSPITAL_COMMUNITY)
Admission: RE | Admit: 2014-02-24 | Discharge: 2014-02-24 | Disposition: A | Discharge status: Home / Self Care | Payer: Medicare Other | Source: Ambulatory Visit | Attending: Otolaryngology | Admitting: Otolaryngology

## 2014-02-24 ENCOUNTER — Other Ambulatory Visit: Payer: Self-pay | Admitting: Otolaryngology

## 2014-02-24 DIAGNOSIS — R229 Localized swelling, mass and lump, unspecified: Principal | ICD-10-CM

## 2014-02-24 DIAGNOSIS — IMO0002 Reserved for concepts with insufficient information to code with codable children: Secondary | ICD-10-CM

## 2014-02-24 DIAGNOSIS — K119 Disease of salivary gland, unspecified: Secondary | ICD-10-CM | POA: Diagnosis present

## 2014-02-26 ENCOUNTER — Inpatient Hospital Stay: Admission: RE | Admit: 2014-02-26 | Payer: Medicare Other | Source: Ambulatory Visit

## 2014-03-03 ENCOUNTER — Ambulatory Visit
Admission: RE | Admit: 2014-03-03 | Discharge: 2014-03-03 | Disposition: A | Discharge status: Home / Self Care | Payer: Medicare Other | Source: Ambulatory Visit | Attending: Otolaryngology | Admitting: Otolaryngology

## 2014-03-03 DIAGNOSIS — IMO0002 Reserved for concepts with insufficient information to code with codable children: Secondary | ICD-10-CM

## 2014-03-03 DIAGNOSIS — R229 Localized swelling, mass and lump, unspecified: Principal | ICD-10-CM

## 2014-03-04 ENCOUNTER — Telehealth: Payer: Self-pay | Admitting: Cardiovascular Disease

## 2014-03-04 DIAGNOSIS — C801 Malignant (primary) neoplasm, unspecified: Secondary | ICD-10-CM

## 2014-03-04 DIAGNOSIS — Z01818 Encounter for other preprocedural examination: Secondary | ICD-10-CM

## 2014-03-04 HISTORY — DX: Malignant (primary) neoplasm, unspecified: C80.1

## 2014-03-04 NOTE — Telephone Encounter (Signed)
[name], as patient requested. [name] said that she is the one that needs to be contacted so patient can get Lexiscan scheduled. This info will be passed to the scheduler.

## 2014-03-04 NOTE — Telephone Encounter (Signed)
-----   Message from Lorretta Harp, MD sent at 03/04/2014  4:40 PM EST ----- Regarding: Lexiscan for pre op clearance [name] needs a Lexi scan Myoview on Thursday of this week for preoperative clearance clearance before radical neck surgery scheduled to be performed by Dr. Radene Journey next week.if this is low risk she can be cleared for her surgery and can stop her aspirin and Plavix. She will need to be off dual antiplatelet therapy for at least one week prior to her surgery.Marland Kitchen  JJB

## 2014-03-04 NOTE — Telephone Encounter (Signed)
Called pt. She is extremely hard of hearing. Pt could not completely understand me. She asked me to call her friend [name] [phone] and discuss this. She stated that she is the one who assist her with her care.

## 2014-03-05 NOTE — Telephone Encounter (Signed)
-----   Message from Lorretta Harp, MD sent at 03/04/2014  4:40 PM EST ----- Regarding: Lexiscan for pre op clearance [name] needs a Lexi scan Myoview on Thursday of this week for preoperative clearance clearance before radical neck surgery scheduled to be performed by Dr. Radene Journey next week.if this is low risk she can be cleared for her surgery and can stop her aspirin and Plavix. She will need to be off dual antiplatelet therapy for at least one week prior to her surgery.Marland Kitchen  JJB

## 2014-03-05 NOTE — Telephone Encounter (Signed)
Appointment made and the order has been placed.

## 2014-03-06 ENCOUNTER — Ambulatory Visit (HOSPITAL_COMMUNITY)
Admission: RE | Admit: 2014-03-06 | Discharge: 2014-03-06 | Disposition: A | Discharge status: Home / Self Care | Payer: Medicare Other | Source: Ambulatory Visit | Attending: Cardiovascular Disease | Admitting: Cardiovascular Disease

## 2014-03-06 ENCOUNTER — Other Ambulatory Visit: Payer: Self-pay | Admitting: Otolaryngology

## 2014-03-06 DIAGNOSIS — R0609 Other forms of dyspnea: Secondary | ICD-10-CM | POA: Insufficient documentation

## 2014-03-06 DIAGNOSIS — E119 Type 2 diabetes mellitus without complications: Secondary | ICD-10-CM | POA: Diagnosis not present

## 2014-03-06 DIAGNOSIS — Z8249 Family history of ischemic heart disease and other diseases of the circulatory system: Secondary | ICD-10-CM | POA: Diagnosis not present

## 2014-03-06 DIAGNOSIS — E785 Hyperlipidemia, unspecified: Secondary | ICD-10-CM | POA: Insufficient documentation

## 2014-03-06 DIAGNOSIS — I2510 Atherosclerotic heart disease of native coronary artery without angina pectoris: Secondary | ICD-10-CM

## 2014-03-06 DIAGNOSIS — E669 Obesity, unspecified: Secondary | ICD-10-CM | POA: Diagnosis not present

## 2014-03-06 DIAGNOSIS — Z794 Long term (current) use of insulin: Secondary | ICD-10-CM | POA: Insufficient documentation

## 2014-03-06 DIAGNOSIS — I10 Essential (primary) hypertension: Secondary | ICD-10-CM | POA: Insufficient documentation

## 2014-03-06 DIAGNOSIS — Z01818 Encounter for other preprocedural examination: Secondary | ICD-10-CM

## 2014-03-06 DIAGNOSIS — Z01810 Encounter for preprocedural cardiovascular examination: Secondary | ICD-10-CM

## 2014-03-06 MED ORDER — TECHNETIUM TC 99M SESTAMIBI GENERIC - CARDIOLITE
29.8000 | Freq: Once | INTRAVENOUS | Status: AC | PRN
  Administered 2014-03-06: 29.8 via INTRAVENOUS

## 2014-03-06 MED ORDER — TECHNETIUM TC 99M SESTAMIBI GENERIC - CARDIOLITE
10.5000 | Freq: Once | INTRAVENOUS | Status: AC | PRN
  Administered 2014-03-06: 11 via INTRAVENOUS

## 2014-03-06 MED ORDER — REGADENOSON 0.4 MG/5ML IV SOLN
0.4000 mg | Freq: Once | INTRAVENOUS | Status: AC
  Administered 2014-03-06: 0.4 mg via INTRAVENOUS

## 2014-03-06 MED ORDER — AMINOPHYLLINE 25 MG/ML IV SOLN
75.0000 mg | Freq: Once | INTRAVENOUS | Status: AC
  Administered 2014-03-06: 75 mg via INTRAVENOUS

## 2014-03-06 NOTE — Procedures (Addendum)
[facility] [address] [place] [address] [phone]  Cardiology Nuclear Med Study  [name] is a 80 y.o. female     MRN : [id number]     DOB: [birth year]  Procedure Date: 03/06/2014  Nuclear Med Background Indication for Stress Test:  Surgical Clearance and Stent Patency History:  CAD;STENT/PTCA;PACER;Last NUC MPI on 03/23/2011-nonischemic;EF=66%;ECHO in 09/2010-EF=45-50%. Cardiac Risk Factors: Carotid Disease, CVA, Family History - CAD, Hypertension, IDDM Type 2, Lipids and Obesity  Symptoms:  DOE   Nuclear Pre-Procedure Caffeine/Decaff Intake:  1:00am NPO After: 11am   IV Site: R Forearm  IV 0.9% NS with Angio Cath:  22g  Chest Size (in):  n/a IV Started by: Rolene Course, RN  Height: 5\' 2"  (1.575 m)  Cup Size: B  BMI:  Body mass index is 32.73 kg/(m^2). Weight:  179 lb (81.194 kg)   Tech Comments:  n/a    Nuclear Med Study 1 or 2 day study: 1 day  Stress Test Type:  [place]  Order Authorizing Provider:  Quay Burow, MD   Resting Radionuclide: Technetium 57m Sestamibi  Resting Radionuclide Dose: 10.5 mCi   Stress Radionuclide:  Technetium 74m Sestamibi  Stress Radionuclide Dose: 29.8 mCi           Stress Protocol Rest HR: 79 Stress HR: 90  Rest BP: 125/71 Stress BP: 141/54  Exercise Time (min): n/a METS: n/a   Predicted Max HR: 137 bpm % Max HR: 68.61 bpm Rate Pressure Product: 15134  Dose of Adenosine (mg):  n/a Dose of Lexiscan: 0.4 mg  Dose of Atropine (mg): n/a Dose of Dobutamine: n/a mcg/kg/min (at max HR)  Stress Test Technologist: Leane Para, CCT Nuclear Technologist: Imagene Riches, CNMT   Rest Procedure:  Myocardial perfusion imaging was performed at rest 45 minutes following the intravenous administration of Technetium 24m Sestamibi. Stress Procedure:  The patient received IV Lexiscan 0.4 mg over 15-seconds.  Technetium 63m Sestamibi injected IV at 30-seconds.  Patient  experienced marked SOB and stomach discomfort and 75 mg Aminophylline IV was administered.There were no significant changes with Lexiscan.  Quantitative spect images were obtained after a 45 minute delay.  Transient Ischemic Dilatation (Normal <1.22):  0.98  QGS EDV:  62 ml QGS ESV:  21 ml LV Ejection Fraction: 67%   Rest ECG: NSR - Normal EKG  Stress ECG: No significant change from baseline ECG  QPS Raw Data Images:  Mild diaphragmatic attenuation.  Normal left ventricular size. Stress Images:  There is decreased uptake in the inferior wall. Rest Images:  Comparison with the stress images reveals no significant change. Subtraction (SDS):  There is a fixed inferior defect that is most consistent with diaphragmatic attenuation. LV Wall Motion:  NL LV Function; NL Wall Motion  Impression Exercise Capacity:  Lexiscan with no exercise. BP Response:  Normal blood pressure response. Clinical Symptoms:  No significant symptoms noted. ECG Impression:  No significant ECG changes with Lexiscan. Comparison with Prior Nuclear Study: No significant change from previous study   Overall Impression:  Low risk stress nuclear study with diaphragmatic attenuation artifact.   [name], MD  03/06/2014 5:34 PM

## 2014-03-06 NOTE — H&P (Signed)
PREOPERATIVE H&P  Chief Complaint: left parotid mass with pain  HPI: [name] is a 80 y.o. female who presents for evaluation of left parotid mass with pain over the last 2 months. This was felt initially to be an infection and was treated with antibiotics. It didn't respond and subsequent FNA was highly suspicious for malignancy. Subsequent CT scan demonstrated 4 cm parotid mass. 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 She's also had several skin cancers removed from the left side of her face. She's taken to the OR for left parotidectomy with removal of the mass and facial nerve dissection. She's aware of the risk to the facial nerve.  Past Medical History  Diagnosis Date  . CHF (congestive heart failure)   . Coronary artery disease     multiple PCI; echo 09/15/10-EF45-50%, aortic valve sclerosis without stenosis; myoview 03/23/11-no reversible defects, EF66%  . Hypertension   . Pacemaker 09/2010    sinus node dysfunction, sinus bradycardia  . Hyperlipidemia   . Hypothyroidism   . Insulin dependent diabetes mellitus   . Stroke about 20 years ago     "left sided weakness at times"  . Carotid bruit 10/25/2002    carotid doppler- normal   Past Surgical History  Procedure Laterality Date  . Insert / replace / remove pacemaker  09/21/10    medtronic Adapta   . Coronary angioplasty with stent placement  09/13/10    preserved LV function, PTCA/stenting of the RCA with insertion of two stents, 2.75x43mm stent proximal RCA and 2.25x11mm at the distal RCA, post stent dilatation up to 2.31mm  . Cardiac catheterization  05/28/10    EF 65%, medical therapy  . Coronary angioplasty with stent placement  06/04/09    PCI, stenting of the mid and prox RCA with Promus DES  . Cardiac catheterization  06/03/09    PCI, stenting of the major oblique marginal arteries off the circ  . Coronary angioplasty with stent placement  10/16/2002    stenting of  mid RCA with 2.75x36mm Cypher stent deployed at 16 atmospheres; EF 60  . Coronary angioplasty with stent placement  11/29/2001    2.75x73mm cutting balloon angioplasty of the in stent restenosis of RCA, bracytherapy of the in stend restenotic segment of mid RCA  . Cardiac catheterization  11/28/2001    high grade isten restenosis of RCA stent placed 03/02/01, proceed with cutting balloon 8/28  . Coronary angioplasty with stent placement  03/02/2001    ptca and stenting of RCA (2.75x54mm Ceta) after predilatation with a 2.5x70mm CrossSail and OpenSail balloon; EF 60%   History   Social History  . Marital Status: Single    Spouse Name: N/A    Number of Children: N/A  . Years of Education: N/A   Social History Main Topics  . Smoking status: Former Smoker    Quit date: 03/01/2012  . Smokeless tobacco: Not on file  . Alcohol Use: No  . Drug Use: No  . Sexual Activity: No   Other Topics Concern  . Not on file   Social History Narrative   Patient lives alone at home.   Next of kin and have her power of attorney is Arliss Journey at phone number listed   Family History  Problem Relation Age of Onset  . Asthma Mother   . Diabetes Father   . Heart failure Father    No Known Allergies Prior to Admission medications  Medication Sig Start Date End Date Taking? Authorizing Provider  aspirin 81 MG chewable tablet Chew 81 mg by mouth daily.      Historical Provider, MD  clopidogrel (PLAVIX) 75 MG tablet Take 1 tablet (75 mg total) by mouth daily. 08/22/13   Mihai Croitoru, MD  furosemide (LASIX) 40 MG tablet Take 0.5 tablets (20 mg total) by mouth every other day. 02/17/12   Nita Sells, MD  gabapentin (NEURONTIN) 300 MG capsule Take 300 mg by mouth 3 (three) times daily.      Historical Provider, MD  insulin glargine (LANTUS) 100 UNIT/ML injection Inject 36 Units into the skin at bedtime. 02/17/12   Nita Sells, MD  isosorbide mononitrate (IMDUR) 30 MG 24 hr tablet Take 1  tablet (30 mg total) by mouth daily. 12/06/13   Lorretta Harp, MD  levothyroxine (SYNTHROID, LEVOTHROID) 25 MCG tablet Take 25 mcg by mouth daily.      Historical Provider, MD  lisinopril (PRINIVIL,ZESTRIL) 5 MG tablet Take 5 mg by mouth daily.    Historical Provider, MD  metoprolol tartrate (LOPRESSOR) 25 MG tablet Take 25 mg by mouth 2 (two) times daily.      Historical Provider, MD  Multiple Vitamins-Minerals (ICAPS PO) Take 1 tablet by mouth daily.      Historical Provider, MD  nitroGLYCERIN (NITROSTAT) 0.4 MG SL tablet Place 1 tablet (0.4 mg total) under the tongue every 5 (five) minutes as needed. Chest pain 06/18/13   Mihai Croitoru, MD  simvastatin (ZOCOR) 20 MG tablet Take 20 mg by mouth every evening.    Historical Provider, MD     Positive ROS: pain around the left ear  All other systems have been reviewed and were otherwise negative with the exception of those mentioned in the HPI and as above.  Physical Exam: There were no vitals filed for this visit.  General: Alert, no acute distress Oral: Normal oral mucosa and tonsils Nasal: Clear nasal passages Neck: Firm mass in the left parotid region. Normal facial nerve function Ear: Ear canal is clear with normal appearing TMs Cardiovascular: Regular rate and rhythm, no murmur.  Respiratory: Clear to auscultation Neurologic: Alert and oriented x 3   Assessment/Plan: LEFT PAROTID CANCER Plan for Procedure(s): LEFT PAROTIDECTOMY WITH FACIAL NERVE DISSECTION  LEFT NECK DISSECTION   Melony Overly, MD 03/06/2014 3:14 PM

## 2014-03-07 ENCOUNTER — Encounter: Payer: Self-pay | Admitting: Cardiovascular Disease

## 2014-03-07 ENCOUNTER — Encounter (HOSPITAL_COMMUNITY): Payer: Medicare Other

## 2014-03-07 ENCOUNTER — Telehealth: Payer: Self-pay | Admitting: *Deleted

## 2014-03-07 DIAGNOSIS — I441 Atrioventricular block, second degree: Secondary | ICD-10-CM

## 2014-03-07 NOTE — Progress Notes (Signed)
I was unable to reach patient by phone.  I left  A message on voice mail.  I instructed the patient to arrive at [facility] entrance at 9:30  , nothing to eat or drink after midnight.   I instructed the patient to take the following medications in the am with just enough water to get them down:Imdur, Synthroid, Metoprolol, Gabapentin  I asked patient to not wear any lotions, powders, cologne, jewelry, piercing, make-up or nail polish.  I asked the patient to call [phone], in the am if there were any questions or problems.

## 2014-03-07 NOTE — Progress Notes (Signed)
Remote pacemaker transmission.   

## 2014-03-07 NOTE — Telephone Encounter (Signed)
-----   Message from Lorretta Harp, MD sent at 03/07/2014  9:27 AM EST ----- Essentially normal study. Repeat when clinically indicated. Cleared for her surgery next week at low risk

## 2014-03-07 NOTE — Telephone Encounter (Signed)
Verbal order for plavix to be held from Dr. Gwenlyn Found. Surgical clearance letter created, signed and faxed.

## 2014-03-09 ENCOUNTER — Encounter (HOSPITAL_COMMUNITY): Payer: Self-pay | Admitting: Anesthesiology

## 2014-03-09 MED ORDER — CEFAZOLIN SODIUM-DEXTROSE 2-3 GM-% IV SOLR: 2.0000 g | INTRAVENOUS | Status: DC

## 2014-03-09 NOTE — Anesthesia Preprocedure Evaluation (Deleted)
Anesthesia Evaluation  Patient identified by MRN, date of birth, ID band Patient awake    Reviewed: Allergy & Precautions, H&P , NPO status , Patient's Chart, lab work & pertinent test results, reviewed documented beta blocker date and time   Airway        Dental   Pulmonary former smoker (quit 2013),          Cardiovascular hypertension, Pt. on medications + CAD + pacemaker (checked regularly,  medtronic)  STENTS last 2012, EF at that cath 65%, Stress 03/2014 low risk   Neuro/Psych    GI/Hepatic   Endo/Other  diabetes, Well Controlled, Type 2, Insulin Dependent  Renal/GU Renal InsufficiencyRenal diseaseGFR 30 2 years ago     Musculoskeletal   Abdominal   Peds  Hematology   Anesthesia Other Findings   Reproductive/Obstetrics                            Anesthesia Physical Anesthesia Plan  ASA: III  Anesthesia Plan: General   Post-op Pain Management:    Induction: Intravenous  Airway Management Planned: Oral ETT  Additional Equipment:   Intra-op Plan:   Post-operative Plan: Extubation in OR  Informed Consent: I have reviewed the patients History and Physical, chart, labs and discussed the procedure including the risks, benefits and alternatives for the proposed anesthesia with the patient or authorized representative who has indicated his/her understanding and acceptance.     Plan Discussed with:   Anesthesia Plan Comments: (Check am labs, creat 2yearsagoelevated with GFR of 30, check to see if she still on plavix)       Anesthesia Quick Evaluation

## 2014-03-10 ENCOUNTER — Telehealth: Payer: Self-pay | Admitting: Cardiovascular Disease

## 2014-03-10 ENCOUNTER — Ambulatory Visit (HOSPITAL_COMMUNITY)
Admission: RE | Admit: 2014-03-10 | Discharge: 2014-03-10 | Disposition: A | Discharge status: Home / Self Care | Payer: Medicare Other | Source: Ambulatory Visit | Attending: Otolaryngology | Admitting: Otolaryngology

## 2014-03-10 DIAGNOSIS — Z79899 Other long term (current) drug therapy: Secondary | ICD-10-CM | POA: Diagnosis not present

## 2014-03-10 DIAGNOSIS — I2510 Atherosclerotic heart disease of native coronary artery without angina pectoris: Secondary | ICD-10-CM | POA: Insufficient documentation

## 2014-03-10 DIAGNOSIS — E785 Hyperlipidemia, unspecified: Secondary | ICD-10-CM | POA: Insufficient documentation

## 2014-03-10 DIAGNOSIS — Z950 Presence of cardiac pacemaker: Secondary | ICD-10-CM | POA: Insufficient documentation

## 2014-03-10 DIAGNOSIS — I10 Essential (primary) hypertension: Secondary | ICD-10-CM | POA: Insufficient documentation

## 2014-03-10 DIAGNOSIS — Z7982 Long term (current) use of aspirin: Secondary | ICD-10-CM | POA: Diagnosis not present

## 2014-03-10 DIAGNOSIS — Z87891 Personal history of nicotine dependence: Secondary | ICD-10-CM | POA: Insufficient documentation

## 2014-03-10 DIAGNOSIS — I509 Heart failure, unspecified: Secondary | ICD-10-CM | POA: Diagnosis not present

## 2014-03-10 DIAGNOSIS — E119 Type 2 diabetes mellitus without complications: Secondary | ICD-10-CM | POA: Insufficient documentation

## 2014-03-10 DIAGNOSIS — C07 Malignant neoplasm of parotid gland: Secondary | ICD-10-CM | POA: Insufficient documentation

## 2014-03-10 DIAGNOSIS — E039 Hypothyroidism, unspecified: Secondary | ICD-10-CM | POA: Insufficient documentation

## 2014-03-10 DIAGNOSIS — Z5309 Procedure and treatment not carried out because of other contraindication: Secondary | ICD-10-CM | POA: Insufficient documentation

## 2014-03-10 DIAGNOSIS — Z8673 Personal history of transient ischemic attack (TIA), and cerebral infarction without residual deficits: Secondary | ICD-10-CM | POA: Diagnosis not present

## 2014-03-10 DIAGNOSIS — Z794 Long term (current) use of insulin: Secondary | ICD-10-CM | POA: Diagnosis not present

## 2014-03-10 MED ORDER — ROCURONIUM BROMIDE 50 MG/5ML IV SOLN
INTRAVENOUS | Status: AC
  Filled 2014-03-10: qty 1

## 2014-03-10 MED ORDER — FENTANYL CITRATE 0.05 MG/ML IJ SOLN
INTRAMUSCULAR | Status: AC
  Filled 2014-03-10: qty 5

## 2014-03-10 MED ORDER — PROPOFOL 10 MG/ML IV BOLUS
INTRAVENOUS | Status: AC
  Filled 2014-03-10: qty 20

## 2014-03-10 MED ORDER — LIDOCAINE HCL (CARDIAC) 20 MG/ML IV SOLN
INTRAVENOUS | Status: AC
  Filled 2014-03-10: qty 10

## 2014-03-10 MED ORDER — MIDAZOLAM HCL 2 MG/2ML IJ SOLN
INTRAMUSCULAR | Status: AC
  Filled 2014-03-10: qty 2

## 2014-03-10 NOTE — Telephone Encounter (Signed)
Colletta Maryland was calling in to speak to a nurse about the the tumor that the pt has. It is painful to her and was instructed to stop taking her blood thinners on Saturday. Colletta Maryland would like to know if he surgery could be scheduled for Friday if possible. Please call  Thanks

## 2014-03-10 NOTE — Progress Notes (Signed)
Patient reported not stopping his plavix and aspirin - notified dr. Lucia Gaskins of this. Dr Lucia Gaskins called surgery and patient was to come into office at 11am today for followup and to reschedule procedure. Patient instructed to stop her plavix and aspirin per dr. Lucia Gaskins

## 2014-03-10 NOTE — Telephone Encounter (Signed)
Spoke with Colletta Maryland - patient's [place] nurse and "adopted granddaughter". She reports patient has a tumor under ear that is going across cheek bone under her skin. Dr. Lucia Gaskins was supposed to do surgery today but patient did not hold her medications as she was supposed to, so this had to be moved to 12/15 (per [place]). Colletta Maryland wants Dr. Lucia Gaskins to do the procedure on Friday 12/11 as this is painful for the patient. She took her last dose of aspirin & plavix on Saturday. Per clearance note sent on 12/4 - patient was to hold plavix x7 days prior to procedure. Informed Colletta Maryland, that patient would be in 7 day time frame of holding medications for procedure if this is going to be scheduled for Friday 12/11. She will reach out to Dr. Pollie Friar office to see if they can reschedule this procedure for the patient.   Sent to [place], Therapist, sports as Juluis Rainier

## 2014-03-11 ENCOUNTER — Telehealth: Payer: Self-pay | Admitting: *Deleted

## 2014-03-11 NOTE — Telephone Encounter (Signed)
[name] called in again - she would like a letter sent to Dr. Pollie Friar office (fax [phone]) stating that Dr. Gwenlyn Found is OK for patient to have surgery (on Friday 12/11). Clearance letter has already been sent but this will be forwarded to Dr. Kennon Holter nurse, [place] RN to address

## 2014-03-12 NOTE — Telephone Encounter (Signed)
Clearance letter has been sent to Dr. Lucia Gaskins. Looking at the patient's chart surgery is already scheduled for 12/11. Not sure what else is needed on this end. Called S. Hudnell and left a message to call back if need be.

## 2014-03-13 ENCOUNTER — Encounter (HOSPITAL_COMMUNITY): Payer: Self-pay

## 2014-03-13 ENCOUNTER — Encounter (HOSPITAL_COMMUNITY)
Admission: RE | Admit: 2014-03-13 | Discharge: 2014-03-13 | Disposition: A | Discharge status: Home / Self Care | Payer: Medicare Other | Source: Ambulatory Visit | Attending: Otolaryngology | Admitting: Otolaryngology

## 2014-03-13 DIAGNOSIS — Z955 Presence of coronary angioplasty implant and graft: Secondary | ICD-10-CM | POA: Insufficient documentation

## 2014-03-13 DIAGNOSIS — E785 Hyperlipidemia, unspecified: Secondary | ICD-10-CM

## 2014-03-13 DIAGNOSIS — I509 Heart failure, unspecified: Secondary | ICD-10-CM

## 2014-03-13 DIAGNOSIS — E039 Hypothyroidism, unspecified: Secondary | ICD-10-CM | POA: Insufficient documentation

## 2014-03-13 DIAGNOSIS — I129 Hypertensive chronic kidney disease with stage 1 through stage 4 chronic kidney disease, or unspecified chronic kidney disease: Secondary | ICD-10-CM

## 2014-03-13 DIAGNOSIS — Z950 Presence of cardiac pacemaker: Secondary | ICD-10-CM | POA: Insufficient documentation

## 2014-03-13 DIAGNOSIS — Z01818 Encounter for other preprocedural examination: Secondary | ICD-10-CM

## 2014-03-13 DIAGNOSIS — N184 Chronic kidney disease, stage 4 (severe): Secondary | ICD-10-CM | POA: Insufficient documentation

## 2014-03-13 DIAGNOSIS — I252 Old myocardial infarction: Secondary | ICD-10-CM | POA: Insufficient documentation

## 2014-03-13 DIAGNOSIS — R0989 Other specified symptoms and signs involving the circulatory and respiratory systems: Secondary | ICD-10-CM | POA: Insufficient documentation

## 2014-03-13 DIAGNOSIS — Z87891 Personal history of nicotine dependence: Secondary | ICD-10-CM

## 2014-03-13 DIAGNOSIS — I2510 Atherosclerotic heart disease of native coronary artery without angina pectoris: Secondary | ICD-10-CM

## 2014-03-13 DIAGNOSIS — Z8673 Personal history of transient ischemic attack (TIA), and cerebral infarction without residual deficits: Secondary | ICD-10-CM

## 2014-03-13 DIAGNOSIS — Z6830 Body mass index (BMI) 30.0-30.9, adult: Secondary | ICD-10-CM | POA: Insufficient documentation

## 2014-03-13 DIAGNOSIS — E119 Type 2 diabetes mellitus without complications: Secondary | ICD-10-CM | POA: Insufficient documentation

## 2014-03-13 HISTORY — DX: Acute myocardial infarction, unspecified: I21.9

## 2014-03-13 HISTORY — DX: Chronic kidney disease, unspecified: N18.9

## 2014-03-13 LAB — BASIC METABOLIC PANEL
ANION GAP: 16 — AB (ref 5–15)
BUN: 43 mg/dL — ABNORMAL HIGH (ref 6–23)
CHLORIDE: 100 meq/L (ref 96–112)
CO2: 21 meq/L (ref 19–32)
CREATININE: 1.72 mg/dL — AB (ref 0.50–1.10)
Calcium: 10.2 mg/dL (ref 8.4–10.5)
GFR calc non Af Amer: 26 mL/min — ABNORMAL LOW (ref >90)
GFR, EST AFRICAN AMERICAN: 30 mL/min — AB (ref >90)
Glucose, Bld: 323 mg/dL — ABNORMAL HIGH (ref 70–99)
Potassium: 5 mEq/L (ref 3.7–5.3)
SODIUM: 137 meq/L (ref 137–147)

## 2014-03-13 LAB — CBC
HCT: 43 % (ref 36.0–46.0)
Hemoglobin: 14.1 g/dL (ref 12.0–15.0)
MCH: 30.6 pg (ref 26.0–34.0)
MCHC: 32.8 g/dL (ref 30.0–36.0)
MCV: 93.3 fL (ref 78.0–100.0)
PLATELETS: 326 10*3/uL (ref 150–400)
RBC: 4.61 MIL/uL (ref 3.87–5.11)
RDW: 14.4 % (ref 11.5–15.5)
WBC: 12.7 10*3/uL — AB (ref 4.0–10.5)

## 2014-03-13 NOTE — Progress Notes (Addendum)
Primary - dr. Felipa Eth Cardiologist - dr. Gwenlyn Found  Stress test dec 2015 in epic, clearance on chart Stopped plavix dec 7th.   Patient stated will be unable to shower prior to being here at 530 tomorrow morning. Would rather use CHG wipes upon arrival to short stay in morning instead of being charged for soap for home.

## 2014-03-13 NOTE — Progress Notes (Signed)
Anesthesia Chart Review:  Pt is 80 year old female scheduled for L parotidectomy with facial nerve dissection on 03/14/2014 with Dr. Lucia Gaskins.   PCP is Dr. Felipa Eth, cardiologist is Dr. Gwenlyn Found.   PMH: CAD (s/p DES x4 (mid and proximal RCA)), MI, CHF, pacemaker, carotid bruit, HTN, stroke (1998), TIA, renal disease stage IV, hyperlipidemia, hypothyroidism, DM. BMI 30. Former smoker (?).   Preoperative labs reviewed.  Glucose 323. Pt's usual morning glucose is 160's. Will check CBG DOS.  Previous result from PCP 01/27/2014 was 126. Today BUN 43, Cr 1.72; this is consistent with results from PCP 01/27/2014 (BUN 45, Cr 1.85).    EKG 08/27/2013: Atrial paced rhythm with prolonged AV conduction.   Nuclear stress test 03/06/2014: Overall Impression: Low risk stress nuclear study with diaphragmatic attenuation artifact.  Echo 06/03/2009: - Left ventricle: The cavity size was normal. Systolic function was normal. The estimated ejection fraction was in the range of 60% to 65%. Wall motion was normal; there were no regional wall motion abnormalities. - Left atrium: The atrium was mildly dilated.  Pt has cardiac clearance for procedure at low risk from Dr. Gwenlyn Found in telephone encounter by [place] Williamson in [place] dated 03/07/2014.   Awaiting perioperative prescription for implanted cardiac device form.   If pt's CBG acceptable DOS, I anticipate pt will be able to proceed as scheduled.   Willeen Cass, FNP-BC [facility]/Anesthesiology Phone: [phone] 03/13/2014 3:53 PM

## 2014-03-13 NOTE — Interval H&P Note (Signed)
History and Physical Interval Note:  03/13/2014 9:16 PM  [name]  has presented today for surgery, with the diagnosis of LEFT PAROTID CANCER  The various methods of treatment have been discussed with the patient and family. After consideration of risks, benefits and other options for treatment, the patient has consented to  Procedure(s): LEFT PAROTIDECTOMY WITH FACIAL NERVE DISSECTION  (Left) LEFT NECK DISSECTION (Left) as a surgical intervention .  The patient's history has been reviewed, patient examined, no change in status, stable for surgery.  I have reviewed the patient's chart and labs.  Questions were answered to the patient's satisfaction.     Latissa Frick

## 2014-03-13 NOTE — Pre-Procedure Instructions (Signed)
Windi Toro Hosmer  03/13/2014   Your procedure is scheduled on:  Friday, December 11th  Report to [facility] Admitting at 530 AM.  Call this number if you have problems the morning of surgery: [phone]   Remember:   Do not eat food or drink liquids after midnight.   Take these medicines the morning of surgery with A SIP OF WATER: neurontin, imdur, synthroid, lopressor   Do not wear jewelry, make-up or nail polish.  Do not wear lotions, powders, or perfumes, deodorant.  Do not shave 48 hours prior to surgery. Men may shave face and neck.  Do not bring valuables to the hospital.  [facility] is not responsible for any belongings or valuables.               Contacts, dentures or bridgework may not be worn into surgery.  Leave suitcase in the car. After surgery it may be brought to your room.  For patients admitted to the hospital, discharge time is determined by your treatment team.               Patients discharged the day of surgery will not be allowed to drive home.  Please read over the following fact sheets that you were given: Pain Booklet, Coughing and Deep Breathing and Surgical Site Infection Prevention  [place] - Preparing for Surgery  Before surgery, you can play an important role.  Because skin is not sterile, your skin needs to be as free of germs as possible.  You can reduce the number of germs on you skin by washing with CHG (chlorahexidine gluconate) soap before surgery.  CHG is an antiseptic cleaner which kills germs and bonds with the skin to continue killing germs even after washing.  Please DO NOT use if you have an allergy to CHG or antibacterial soaps.  If your skin becomes reddened/irritated stop using the CHG and inform your nurse when you arrive at Short Stay.  Do not shave (including legs and underarms) for at least 48 hours prior to the first CHG shower.  You may shave your face.  Please follow these instructions carefully:   1.  Shower  with CHG Soap the night before surgery and the morning of Surgery.  2.  If you choose to wash your hair, wash your hair first as usual with your normal shampoo.  3.  After you shampoo, rinse your hair and body thoroughly to remove the shampoo.  4.  Use CHG as you would any other liquid soap.  You can apply CHG directly to the skin and wash gently with scrungie or a clean washcloth.  5.  Apply the CHG Soap to your body ONLY FROM THE NECK DOWN.  Do not use on open wounds or open sores.  Avoid contact with your eyes, ears, mouth and genitals (private parts).  Wash genitals (private parts) with your normal soap.  6.  Wash thoroughly, paying special attention to the area where your surgery will be performed.  7.  Thoroughly rinse your body with warm water from the neck down.  8.  DO NOT shower/wash with your normal soap after using and rinsing off the CHG Soap.  9.  Pat yourself dry with a clean towel.            10.  Wear clean pajamas.            11.  Place clean sheets on your bed the night of your first shower and  do not sleep with pets.  Day of Surgery  Do not apply any lotions/deoderants the morning of surgery.  Please wear clean clothes to the hospital/surgery center.

## 2014-03-13 NOTE — H&P (View-Only) (Signed)
PREOPERATIVE H&P  Chief Complaint: left parotid mass with pain  HPI: [name] is a 80 y.o. female who presents for evaluation of left parotid mass with pain over the last 2 months. This was felt initially to be an infection and was treated with antibiotics. It didn't respond and subsequent FNA was highly suspicious for malignancy. Subsequent CT scan demonstrated 4 cm parotid mass. 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 She's also had several skin cancers removed from the left side of her face. She's taken to the OR for left parotidectomy with removal of the mass and facial nerve dissection. She's aware of the risk to the facial nerve.  Past Medical History  Diagnosis Date  . CHF (congestive heart failure)   . Coronary artery disease     multiple PCI; echo 09/15/10-EF45-50%, aortic valve sclerosis without stenosis; myoview 03/23/11-no reversible defects, EF66%  . Hypertension   . Pacemaker 09/2010    sinus node dysfunction, sinus bradycardia  . Hyperlipidemia   . Hypothyroidism   . Insulin dependent diabetes mellitus   . Stroke about 20 years ago     "left sided weakness at times"  . Carotid bruit 10/25/2002    carotid doppler- normal   Past Surgical History  Procedure Laterality Date  . Insert / replace / remove pacemaker  09/21/10    medtronic Adapta   . Coronary angioplasty with stent placement  09/13/10    preserved LV function, PTCA/stenting of the RCA with insertion of two stents, 2.75x85mm stent proximal RCA and 2.25x72mm at the distal RCA, post stent dilatation up to 2.30mm  . Cardiac catheterization  05/28/10    EF 65%, medical therapy  . Coronary angioplasty with stent placement  06/04/09    PCI, stenting of the mid and prox RCA with Promus DES  . Cardiac catheterization  06/03/09    PCI, stenting of the major oblique marginal arteries off the circ  . Coronary angioplasty with stent placement  10/16/2002    stenting of  mid RCA with 2.75x79mm Cypher stent deployed at 16 atmospheres; EF 60  . Coronary angioplasty with stent placement  11/29/2001    2.75x15mm cutting balloon angioplasty of the in stent restenosis of RCA, bracytherapy of the in stend restenotic segment of mid RCA  . Cardiac catheterization  11/28/2001    high grade isten restenosis of RCA stent placed 03/02/01, proceed with cutting balloon 8/28  . Coronary angioplasty with stent placement  03/02/2001    ptca and stenting of RCA (2.75x54mm Ceta) after predilatation with a 2.5x53mm CrossSail and OpenSail balloon; EF 60%   History   Social History  . Marital Status: Single    Spouse Name: N/A    Number of Children: N/A  . Years of Education: N/A   Social History Main Topics  . Smoking status: Former Smoker    Quit date: 03/01/2012  . Smokeless tobacco: Not on file  . Alcohol Use: No  . Drug Use: No  . Sexual Activity: No   Other Topics Concern  . Not on file   Social History Narrative   Patient lives alone at home.   Next of kin and have her power of attorney is Arliss Journey at phone number listed   Family History  Problem Relation Age of Onset  . Asthma Mother   . Diabetes Father   . Heart failure Father    No Known Allergies Prior to Admission medications  Medication Sig Start Date End Date Taking? Authorizing Provider  aspirin 81 MG chewable tablet Chew 81 mg by mouth daily.      Historical Provider, MD  clopidogrel (PLAVIX) 75 MG tablet Take 1 tablet (75 mg total) by mouth daily. 08/22/13   Mihai Croitoru, MD  furosemide (LASIX) 40 MG tablet Take 0.5 tablets (20 mg total) by mouth every other day. 02/17/12   Nita Sells, MD  gabapentin (NEURONTIN) 300 MG capsule Take 300 mg by mouth 3 (three) times daily.      Historical Provider, MD  insulin glargine (LANTUS) 100 UNIT/ML injection Inject 36 Units into the skin at bedtime. 02/17/12   Nita Sells, MD  isosorbide mononitrate (IMDUR) 30 MG 24 hr tablet Take 1  tablet (30 mg total) by mouth daily. 12/06/13   Lorretta Harp, MD  levothyroxine (SYNTHROID, LEVOTHROID) 25 MCG tablet Take 25 mcg by mouth daily.      Historical Provider, MD  lisinopril (PRINIVIL,ZESTRIL) 5 MG tablet Take 5 mg by mouth daily.    Historical Provider, MD  metoprolol tartrate (LOPRESSOR) 25 MG tablet Take 25 mg by mouth 2 (two) times daily.      Historical Provider, MD  Multiple Vitamins-Minerals (ICAPS PO) Take 1 tablet by mouth daily.      Historical Provider, MD  nitroGLYCERIN (NITROSTAT) 0.4 MG SL tablet Place 1 tablet (0.4 mg total) under the tongue every 5 (five) minutes as needed. Chest pain 06/18/13   Mihai Croitoru, MD  simvastatin (ZOCOR) 20 MG tablet Take 20 mg by mouth every evening.    Historical Provider, MD     Positive ROS: pain around the left ear  All other systems have been reviewed and were otherwise negative with the exception of those mentioned in the HPI and as above.  Physical Exam: There were no vitals filed for this visit.  General: Alert, no acute distress Oral: Normal oral mucosa and tonsils Nasal: Clear nasal passages Neck: Firm mass in the left parotid region. Normal facial nerve function Ear: Ear canal is clear with normal appearing TMs Cardiovascular: Regular rate and rhythm, no murmur.  Respiratory: Clear to auscultation Neurologic: Alert and oriented x 3   Assessment/Plan: LEFT PAROTID CANCER Plan for Procedure(s): LEFT PAROTIDECTOMY WITH FACIAL NERVE DISSECTION  LEFT NECK DISSECTION   Melony Overly, MD 03/06/2014 3:14 PM

## 2014-03-14 ENCOUNTER — Encounter (HOSPITAL_COMMUNITY): Payer: Self-pay | Admitting: Surgery

## 2014-03-14 ENCOUNTER — Encounter (HOSPITAL_COMMUNITY)
Admission: RE | Disposition: A | Discharge status: Skilled Nursing Facility | Payer: Self-pay | Source: Ambulatory Visit | Attending: Otolaryngology

## 2014-03-14 ENCOUNTER — Ambulatory Visit (HOSPITAL_COMMUNITY): Payer: Medicare Other | Admitting: Certified Registered Nurse Anesthetist

## 2014-03-14 ENCOUNTER — Inpatient Hospital Stay (HOSPITAL_COMMUNITY)
Admission: RE | Admit: 2014-03-14 | Discharge: 2014-03-18 | DRG: 134 | Disposition: A | Discharge status: Skilled Nursing Facility | Payer: Medicare Other | Source: Ambulatory Visit | Attending: Otolaryngology | Admitting: Otolaryngology

## 2014-03-14 ENCOUNTER — Ambulatory Visit (HOSPITAL_COMMUNITY): Payer: Medicare Other | Admitting: Emergency Medicine

## 2014-03-14 DIAGNOSIS — C07 Malignant neoplasm of parotid gland: Principal | ICD-10-CM | POA: Diagnosis present

## 2014-03-14 DIAGNOSIS — I10 Essential (primary) hypertension: Secondary | ICD-10-CM | POA: Diagnosis present

## 2014-03-14 DIAGNOSIS — E119 Type 2 diabetes mellitus without complications: Secondary | ICD-10-CM | POA: Diagnosis present

## 2014-03-14 DIAGNOSIS — Z8673 Personal history of transient ischemic attack (TIA), and cerebral infarction without residual deficits: Secondary | ICD-10-CM | POA: Diagnosis not present

## 2014-03-14 DIAGNOSIS — Z95818 Presence of other cardiac implants and grafts: Secondary | ICD-10-CM | POA: Diagnosis not present

## 2014-03-14 DIAGNOSIS — Z7902 Long term (current) use of antithrombotics/antiplatelets: Secondary | ICD-10-CM

## 2014-03-14 DIAGNOSIS — Z7982 Long term (current) use of aspirin: Secondary | ICD-10-CM

## 2014-03-14 DIAGNOSIS — E785 Hyperlipidemia, unspecified: Secondary | ICD-10-CM | POA: Diagnosis present

## 2014-03-14 DIAGNOSIS — N189 Chronic kidney disease, unspecified: Secondary | ICD-10-CM | POA: Diagnosis present

## 2014-03-14 DIAGNOSIS — I2510 Atherosclerotic heart disease of native coronary artery without angina pectoris: Secondary | ICD-10-CM | POA: Diagnosis present

## 2014-03-14 DIAGNOSIS — I509 Heart failure, unspecified: Secondary | ICD-10-CM | POA: Diagnosis present

## 2014-03-14 DIAGNOSIS — E039 Hypothyroidism, unspecified: Secondary | ICD-10-CM | POA: Diagnosis present

## 2014-03-14 DIAGNOSIS — Z87891 Personal history of nicotine dependence: Secondary | ICD-10-CM | POA: Diagnosis not present

## 2014-03-14 DIAGNOSIS — R220 Localized swelling, mass and lump, head: Secondary | ICD-10-CM | POA: Diagnosis present

## 2014-03-14 DIAGNOSIS — Z794 Long term (current) use of insulin: Secondary | ICD-10-CM | POA: Diagnosis not present

## 2014-03-14 DIAGNOSIS — Z950 Presence of cardiac pacemaker: Secondary | ICD-10-CM

## 2014-03-14 DIAGNOSIS — Z79899 Other long term (current) drug therapy: Secondary | ICD-10-CM

## 2014-03-14 DIAGNOSIS — Z9889 Other specified postprocedural states: Secondary | ICD-10-CM

## 2014-03-14 HISTORY — PX: PAROTIDECTOMY: SHX2163

## 2014-03-14 HISTORY — PX: RADICAL NECK DISSECTION: SHX2284

## 2014-03-14 LAB — GLUCOSE, CAPILLARY
GLUCOSE-CAPILLARY: 195 mg/dL — AB (ref 70–99)
GLUCOSE-CAPILLARY: 341 mg/dL — AB (ref 70–99)
Glucose-Capillary: 149 mg/dL — ABNORMAL HIGH (ref 70–99)
Glucose-Capillary: 236 mg/dL — ABNORMAL HIGH (ref 70–99)

## 2014-03-14 SURGERY — EXCISION, PAROTID GLAND
Anesthesia: General | Site: Neck | Laterality: Left

## 2014-03-14 MED ORDER — PROPOFOL 10 MG/ML IV BOLUS
INTRAVENOUS | Status: AC
  Filled 2014-03-14: qty 20

## 2014-03-14 MED ORDER — CEFAZOLIN SODIUM-DEXTROSE 2-3 GM-% IV SOLR
INTRAVENOUS | Status: AC
  Filled 2014-03-14: qty 50

## 2014-03-14 MED ORDER — NEOSTIGMINE METHYLSULFATE 10 MG/10ML IV SOLN
INTRAVENOUS | Status: DC | PRN
  Administered 2014-03-14: 2 mg via INTRAVENOUS

## 2014-03-14 MED ORDER — ONDANSETRON HCL 4 MG/2ML IJ SOLN
INTRAMUSCULAR | Status: DC | PRN
  Administered 2014-03-14: 4 mg via INTRAVENOUS

## 2014-03-14 MED ORDER — OXYBUTYNIN CHLORIDE ER 10 MG PO TB24
10.0000 mg | ORAL_TABLET | ORAL | Status: DC
  Administered 2014-03-16 – 2014-03-18 (×2): 10 mg via ORAL
  Filled 2014-03-14 (×3): qty 1

## 2014-03-14 MED ORDER — SUCCINYLCHOLINE CHLORIDE 20 MG/ML IJ SOLN
INTRAMUSCULAR | Status: AC
  Filled 2014-03-14: qty 1

## 2014-03-14 MED ORDER — DEXAMETHASONE SODIUM PHOSPHATE 4 MG/ML IJ SOLN
INTRAMUSCULAR | Status: DC | PRN
  Administered 2014-03-14: 4 mg via INTRAVENOUS

## 2014-03-14 MED ORDER — SIMVASTATIN 20 MG PO TABS
20.0000 mg | ORAL_TABLET | Freq: Every evening | ORAL | Status: DC
  Administered 2014-03-14 – 2014-03-17 (×4): 20 mg via ORAL
  Filled 2014-03-14 (×5): qty 1
  Filled 2014-03-14: qty 2

## 2014-03-14 MED ORDER — ISOSORBIDE MONONITRATE ER 30 MG PO TB24
30.0000 mg | ORAL_TABLET | Freq: Every day | ORAL | Status: DC
  Administered 2014-03-15 – 2014-03-18 (×4): 30 mg via ORAL
  Filled 2014-03-14 (×4): qty 1

## 2014-03-14 MED ORDER — INSULIN GLARGINE 100 UNIT/ML ~~LOC~~ SOLN
38.0000 [IU] | Freq: Every day | SUBCUTANEOUS | Status: DC
Start: 2014-03-14 — End: 2014-03-18
  Administered 2014-03-14 – 2014-03-17 (×4): 38 [IU] via SUBCUTANEOUS
  Filled 2014-03-14 (×5): qty 0.38

## 2014-03-14 MED ORDER — LIDOCAINE HCL (CARDIAC) 20 MG/ML IV SOLN
INTRAVENOUS | Status: AC
  Filled 2014-03-14: qty 5

## 2014-03-14 MED ORDER — FENTANYL CITRATE 0.05 MG/ML IJ SOLN
INTRAMUSCULAR | Status: DC | PRN
  Administered 2014-03-14 (×2): 50 ug via INTRAVENOUS
  Administered 2014-03-14: 100 ug via INTRAVENOUS
  Administered 2014-03-14 (×2): 50 ug via INTRAVENOUS
  Administered 2014-03-14: 25 ug via INTRAVENOUS

## 2014-03-14 MED ORDER — SODIUM CHLORIDE 0.9 % IV SOLN
10.0000 mg | INTRAVENOUS | Status: DC | PRN
  Administered 2014-03-14: 20 ug/min via INTRAVENOUS

## 2014-03-14 MED ORDER — BACITRACIN ZINC 500 UNIT/GM EX OINT
TOPICAL_OINTMENT | CUTANEOUS | Status: AC
  Filled 2014-03-14: qty 15

## 2014-03-14 MED ORDER — ACETAMINOPHEN 650 MG RE SUPP: 650.0000 mg | RECTAL | Status: DC | PRN

## 2014-03-14 MED ORDER — LIDOCAINE-EPINEPHRINE 1 %-1:100000 IJ SOLN
INTRAMUSCULAR | Status: AC
  Filled 2014-03-14: qty 1

## 2014-03-14 MED ORDER — BACITRACIN ZINC 500 UNIT/GM EX OINT
1.0000 "application " | TOPICAL_OINTMENT | Freq: Three times a day (TID) | CUTANEOUS | Status: DC
  Administered 2014-03-14 – 2014-03-18 (×12): 1 via TOPICAL
  Filled 2014-03-14: qty 28.35

## 2014-03-14 MED ORDER — ONDANSETRON HCL 4 MG/2ML IJ SOLN: 4.0000 mg | INTRAMUSCULAR | Status: DC | PRN

## 2014-03-14 MED ORDER — CEFAZOLIN SODIUM-DEXTROSE 2-3 GM-% IV SOLR
2.0000 g | Freq: Once | INTRAVENOUS | Status: AC
  Administered 2014-03-14 (×2): 2 g via INTRAVENOUS

## 2014-03-14 MED ORDER — LISINOPRIL 5 MG PO TABS
5.0000 mg | ORAL_TABLET | Freq: Every day | ORAL | Status: DC
  Administered 2014-03-14 – 2014-03-18 (×5): 5 mg via ORAL
  Filled 2014-03-14 (×5): qty 1

## 2014-03-14 MED ORDER — GABAPENTIN 300 MG PO CAPS
300.0000 mg | ORAL_CAPSULE | Freq: Two times a day (BID) | ORAL | Status: DC
  Administered 2014-03-14 – 2014-03-18 (×8): 300 mg via ORAL
  Filled 2014-03-14 (×11): qty 1

## 2014-03-14 MED ORDER — FUROSEMIDE 20 MG PO TABS
20.0000 mg | ORAL_TABLET | ORAL | Status: DC
  Administered 2014-03-14 – 2014-03-17 (×3): 20 mg via ORAL
  Filled 2014-03-14 (×3): qty 1

## 2014-03-14 MED ORDER — METOPROLOL TARTRATE 25 MG PO TABS
25.0000 mg | ORAL_TABLET | Freq: Two times a day (BID) | ORAL | Status: DC
  Administered 2014-03-15 – 2014-03-18 (×6): 25 mg via ORAL
  Filled 2014-03-14 (×11): qty 1

## 2014-03-14 MED ORDER — ONDANSETRON HCL 4 MG/2ML IJ SOLN
INTRAMUSCULAR | Status: AC
  Filled 2014-03-14: qty 2

## 2014-03-14 MED ORDER — CALCITRIOL 0.25 MCG PO CAPS
0.2500 ug | ORAL_CAPSULE | Freq: Every day | ORAL | Status: DC
  Administered 2014-03-15 – 2014-03-18 (×4): 0.25 ug via ORAL
  Filled 2014-03-14 (×5): qty 1

## 2014-03-14 MED ORDER — FENTANYL CITRATE 0.05 MG/ML IJ SOLN
INTRAMUSCULAR | Status: AC
  Filled 2014-03-14: qty 5

## 2014-03-14 MED ORDER — BACITRACIN ZINC 500 UNIT/GM EX OINT
TOPICAL_OINTMENT | CUTANEOUS | Status: DC | PRN
  Administered 2014-03-14: 1 via TOPICAL

## 2014-03-14 MED ORDER — MIDAZOLAM HCL 2 MG/2ML IJ SOLN: 0.5000 mg | Freq: Once | INTRAMUSCULAR | Status: DC | PRN

## 2014-03-14 MED ORDER — CEFAZOLIN SODIUM 1-5 GM-% IV SOLN
1.0000 g | Freq: Two times a day (BID) | INTRAVENOUS | Status: DC
  Administered 2014-03-14 – 2014-03-16 (×5): 1 g via INTRAVENOUS
  Filled 2014-03-14 (×6): qty 50

## 2014-03-14 MED ORDER — MORPHINE SULFATE 2 MG/ML IJ SOLN
2.0000 mg | INTRAMUSCULAR | Status: DC | PRN
  Administered 2014-03-14 – 2014-03-17 (×3): 2 mg via INTRAVENOUS
  Filled 2014-03-14 (×3): qty 1

## 2014-03-14 MED ORDER — 0.9 % SODIUM CHLORIDE (POUR BTL) OPTIME
TOPICAL | Status: DC | PRN
  Administered 2014-03-14: 1000 mL

## 2014-03-14 MED ORDER — OXYCODONE HCL 5 MG PO TABS: 5.0000 mg | ORAL_TABLET | Freq: Once | ORAL | Status: DC | PRN

## 2014-03-14 MED ORDER — NITROGLYCERIN 0.4 MG SL SUBL
0.4000 mg | SUBLINGUAL_TABLET | SUBLINGUAL | Status: DC | PRN
Start: 2014-03-14 — End: 2014-03-18

## 2014-03-14 MED ORDER — POLYVINYL ALCOHOL 1.4 % OP SOLN
2.0000 [drp] | Freq: Three times a day (TID) | OPHTHALMIC | Status: DC
  Administered 2014-03-14 – 2014-03-18 (×11): 2 [drp] via OPHTHALMIC
  Filled 2014-03-14: qty 15

## 2014-03-14 MED ORDER — HYDROCODONE-ACETAMINOPHEN 5-325 MG PO TABS
1.0000 | ORAL_TABLET | ORAL | Status: DC | PRN
Start: 2014-03-14 — End: 2014-03-18
  Administered 2014-03-14 – 2014-03-18 (×13): 1 via ORAL
  Filled 2014-03-14 (×14): qty 1

## 2014-03-14 MED ORDER — OXYCODONE HCL 5 MG/5ML PO SOLN: 5.0000 mg | Freq: Once | ORAL | Status: DC | PRN

## 2014-03-14 MED ORDER — HYPROMELLOSE (GONIOSCOPIC) 2.5 % OP SOLN
2.0000 [drp] | Freq: Three times a day (TID) | OPHTHALMIC | Status: DC
  Filled 2014-03-14: qty 15

## 2014-03-14 MED ORDER — LIDOCAINE-EPINEPHRINE 1 %-1:100000 IJ SOLN
INTRAMUSCULAR | Status: DC | PRN
  Administered 2014-03-14: 7 mL

## 2014-03-14 MED ORDER — PROPOFOL INFUSION 10 MG/ML OPTIME
INTRAVENOUS | Status: DC | PRN
  Administered 2014-03-14: 50 mg/kg/h via INTRAVENOUS

## 2014-03-14 MED ORDER — PROPOFOL 10 MG/ML IV BOLUS
INTRAVENOUS | Status: DC | PRN
  Administered 2014-03-14: 20 mg via INTRAVENOUS
  Administered 2014-03-14: 150 mg via INTRAVENOUS

## 2014-03-14 MED ORDER — PHENYLEPHRINE HCL 10 MG/ML IJ SOLN
INTRAMUSCULAR | Status: DC | PRN
  Administered 2014-03-14: 40 ug via INTRAVENOUS

## 2014-03-14 MED ORDER — ARTIFICIAL TEARS OP OINT
TOPICAL_OINTMENT | OPHTHALMIC | Status: DC | PRN
  Administered 2014-03-14: 1 via OPHTHALMIC

## 2014-03-14 MED ORDER — DEXAMETHASONE SODIUM PHOSPHATE 4 MG/ML IJ SOLN
INTRAMUSCULAR | Status: AC
  Filled 2014-03-14: qty 1

## 2014-03-14 MED ORDER — LIDOCAINE HCL (CARDIAC) 20 MG/ML IV SOLN
INTRAVENOUS | Status: DC | PRN
Start: 2014-03-14 — End: 2014-03-14
  Administered 2014-03-14: 100 mg via INTRAVENOUS

## 2014-03-14 MED ORDER — LEVOTHYROXINE SODIUM 25 MCG PO TABS
25.0000 ug | ORAL_TABLET | Freq: Every day | ORAL | Status: DC
  Administered 2014-03-15 – 2014-03-18 (×4): 25 ug via ORAL
  Filled 2014-03-14 (×5): qty 1

## 2014-03-14 MED ORDER — MEPERIDINE HCL 25 MG/ML IJ SOLN: 6.2500 mg | INTRAMUSCULAR | Status: DC | PRN

## 2014-03-14 MED ORDER — ARTIFICIAL TEARS OP OINT
TOPICAL_OINTMENT | OPHTHALMIC | Status: AC
  Filled 2014-03-14: qty 3.5

## 2014-03-14 MED ORDER — ACETAMINOPHEN 160 MG/5ML PO SOLN: 650.0000 mg | ORAL | Status: DC | PRN

## 2014-03-14 MED ORDER — HYDROMORPHONE HCL 1 MG/ML IJ SOLN
INTRAMUSCULAR | Status: AC
  Administered 2014-03-14: 0.25 mg via INTRAVENOUS
  Filled 2014-03-14: qty 1

## 2014-03-14 MED ORDER — HYDROMORPHONE HCL 1 MG/ML IJ SOLN
0.2500 mg | INTRAMUSCULAR | Status: DC | PRN
  Administered 2014-03-14: 0.25 mg via INTRAVENOUS

## 2014-03-14 MED ORDER — ROCURONIUM BROMIDE 50 MG/5ML IV SOLN
INTRAVENOUS | Status: AC
  Filled 2014-03-14: qty 1

## 2014-03-14 MED ORDER — GLYCOPYRROLATE 0.2 MG/ML IJ SOLN
INTRAMUSCULAR | Status: DC | PRN
  Administered 2014-03-14: 0.4 mg via INTRAVENOUS

## 2014-03-14 MED ORDER — ONDANSETRON HCL 4 MG PO TABS: 4.0000 mg | ORAL_TABLET | ORAL | Status: DC | PRN

## 2014-03-14 MED ORDER — ASPIRIN 81 MG PO CHEW
81.0000 mg | CHEWABLE_TABLET | Freq: Every day | ORAL | Status: DC
Start: 2014-03-14 — End: 2014-03-18
  Administered 2014-03-14 – 2014-03-18 (×5): 81 mg via ORAL
  Filled 2014-03-14 (×5): qty 1

## 2014-03-14 MED ORDER — LACTATED RINGERS IV SOLN
INTRAVENOUS | Status: DC | PRN
  Administered 2014-03-14 (×3): via INTRAVENOUS

## 2014-03-14 MED ORDER — KCL IN DEXTROSE-NACL 20-5-0.45 MEQ/L-%-% IV SOLN
INTRAVENOUS | Status: DC
  Administered 2014-03-14 – 2014-03-16 (×4): via INTRAVENOUS
  Filled 2014-03-14 (×7): qty 1000

## 2014-03-14 MED ORDER — ROCURONIUM BROMIDE 100 MG/10ML IV SOLN
INTRAVENOUS | Status: DC | PRN
  Administered 2014-03-14: 10 mg via INTRAVENOUS
  Administered 2014-03-14: 40 mg via INTRAVENOUS

## 2014-03-14 MED ORDER — ZOLPIDEM TARTRATE 5 MG PO TABS: 5.0000 mg | ORAL_TABLET | Freq: Every evening | ORAL | Status: DC | PRN

## 2014-03-14 MED ORDER — PHENYLEPHRINE 40 MCG/ML (10ML) SYRINGE FOR IV PUSH (FOR BLOOD PRESSURE SUPPORT)
PREFILLED_SYRINGE | INTRAVENOUS | Status: AC
  Filled 2014-03-14: qty 10

## 2014-03-14 MED ORDER — PROMETHAZINE HCL 25 MG/ML IJ SOLN: 6.2500 mg | INTRAMUSCULAR | Status: DC | PRN

## 2014-03-14 SURGICAL SUPPLY — 72 items
ATTRACTOMAT 16X20 MAGNETIC DRP (DRAPES) ×1 IMPLANT
BLADE SCALPEL THERMAL 15 (MISCELLANEOUS) ×4 IMPLANT
BLADE SURG 10 STRL SS (BLADE) ×3 IMPLANT
BLADE SURG 12 STRL SS (BLADE) ×3 IMPLANT
BLADE SURG 15 STRL LF DISP TIS (BLADE) IMPLANT
BLADE SURG 15 STRL SS (BLADE) ×6
BLADE SURG ROTATE 9660 (MISCELLANEOUS) IMPLANT
BNDG GAUZE ELAST 4 BULKY (GAUZE/BANDAGES/DRESSINGS) IMPLANT
BUR SABER RD CUTTING 3.0 (BURR) ×1 IMPLANT
BUR SABER RD CUTTING 3.0MM (BURR) ×1
CANISTER SUCTION 2500CC (MISCELLANEOUS) ×3 IMPLANT
CLEANER TIP ELECTROSURG 2X2 (MISCELLANEOUS) ×3 IMPLANT
CONT SPEC 4OZ CLIKSEAL STRL BL (MISCELLANEOUS) ×3 IMPLANT
CORDS BIPOLAR (ELECTRODE) IMPLANT
COVER SURGICAL LIGHT HANDLE (MISCELLANEOUS) ×3 IMPLANT
DECANTER SPIKE VIAL GLASS SM (MISCELLANEOUS) ×3 IMPLANT
DRAIN SNY 10 ROU (WOUND CARE) ×1 IMPLANT
DRAIN SNY 7 FPER (WOUND CARE) ×2 IMPLANT
DRAPE SURG 17X11 SM STRL (DRAPES) ×3 IMPLANT
ELECT COATED BLADE 2.86 ST (ELECTRODE) ×3 IMPLANT
ELECT REM PT RETURN 9FT ADLT (ELECTROSURGICAL) ×3
ELECTRODE REM PT RTRN 9FT ADLT (ELECTROSURGICAL) ×1 IMPLANT
EVACUATOR 1/8 PVC DRAIN (DRAIN) ×2 IMPLANT
GAUZE SPONGE 4X4 12PLY STRL (GAUZE/BANDAGES/DRESSINGS) IMPLANT
GAUZE SPONGE 4X4 16PLY XRAY LF (GAUZE/BANDAGES/DRESSINGS) ×9 IMPLANT
GLOVE BIO SURGEON STRL SZ 6.5 (GLOVE) ×1 IMPLANT
GLOVE BIO SURGEONS STRL SZ 6.5 (GLOVE) ×1
GLOVE BIOGEL PI IND STRL 7.0 (GLOVE) IMPLANT
GLOVE BIOGEL PI INDICATOR 7.0 (GLOVE) ×6
GLOVE SS BIOGEL STRL SZ 7.5 (GLOVE) ×2 IMPLANT
GLOVE SUPERSENSE BIOGEL SZ 7.5 (GLOVE) ×2
GLOVE SURG SS PI 7.0 STRL IVOR (GLOVE) ×4 IMPLANT
GOWN STRL REUS W/ TWL LRG LVL3 (GOWN DISPOSABLE) ×1 IMPLANT
GOWN STRL REUS W/ TWL XL LVL3 (GOWN DISPOSABLE) ×2 IMPLANT
GOWN STRL REUS W/TWL LRG LVL3 (GOWN DISPOSABLE) ×9
GOWN STRL REUS W/TWL XL LVL3 (GOWN DISPOSABLE) ×6
KIT BASIN OR (CUSTOM PROCEDURE TRAY) ×3 IMPLANT
KIT ROOM TURNOVER OR (KITS) ×3 IMPLANT
LAMELLAR BLADE ×2 IMPLANT
LOCATOR NERVE 3 VOLT (DISPOSABLE) ×3 IMPLANT
MARKER SKIN DUAL TIP RULER LAB (MISCELLANEOUS) ×3 IMPLANT
NDL HYPO 25GX1X1/2 BEV (NEEDLE) IMPLANT
NEEDLE HYPO 25GX1X1/2 BEV (NEEDLE) ×3 IMPLANT
NS IRRIG 1000ML POUR BTL (IV SOLUTION) ×3 IMPLANT
PAD ARMBOARD 7.5X6 YLW CONV (MISCELLANEOUS) ×6 IMPLANT
PENCIL BUTTON HOLSTER BLD 10FT (ELECTRODE) ×3 IMPLANT
SPECIMEN JAR MEDIUM (MISCELLANEOUS) ×1 IMPLANT
SPONGE GAUZE 4X4 12PLY STER LF (GAUZE/BANDAGES/DRESSINGS) ×2 IMPLANT
SPONGE INTESTINAL PEANUT (DISPOSABLE) ×3 IMPLANT
SPONGE LAP 18X18 X RAY DECT (DISPOSABLE) ×3 IMPLANT
STAPLER VISISTAT 35W (STAPLE) ×3 IMPLANT
SUCTION HEMOVAC SNY HYST (SUCTIONS) IMPLANT
SUT CHROMIC 3 0 PS 2 (SUTURE) ×9 IMPLANT
SUT CHROMIC GUT 2 0 PS 2 27 (SUTURE) ×6 IMPLANT
SUT ETHILON 5 0 P 3 18 (SUTURE) ×4
SUT ETHILON 8 0 BV130 4 (SUTURE) IMPLANT
SUT NYLON ETHILON 5-0 P-3 1X18 (SUTURE) ×1 IMPLANT
SUT SILK 2 0 (SUTURE) ×3
SUT SILK 2 0 FS (SUTURE) ×4 IMPLANT
SUT SILK 2 0 SH CR/8 (SUTURE) ×3 IMPLANT
SUT SILK 2-0 18XBRD TIE 12 (SUTURE) ×2 IMPLANT
SUT SILK 3 0 (SUTURE) ×3
SUT SILK 3-0 18XBRD TIE 12 (SUTURE) ×3 IMPLANT
SUT SILK 4 0 (SUTURE)
SUT SILK 4-0 18XBRD TIE 12 (SUTURE) IMPLANT
SUT VIC AB 3-0 FS2 27 (SUTURE) IMPLANT
TAPE CLOTH SURG 4X10 WHT LF (GAUZE/BANDAGES/DRESSINGS) ×2 IMPLANT
TOWEL OR 17X24 6PK STRL BLUE (TOWEL DISPOSABLE) ×3 IMPLANT
TOWEL OR 17X26 10 PK STRL BLUE (TOWEL DISPOSABLE) ×3 IMPLANT
TRAY ENT MC OR (CUSTOM PROCEDURE TRAY) ×3 IMPLANT
TRAY FOLEY CATH 14FR (SET/KITS/TRAYS/PACK) ×3 IMPLANT
WATER STERILE IRR 1000ML POUR (IV SOLUTION) IMPLANT

## 2014-03-14 NOTE — Anesthesia Preprocedure Evaluation (Signed)
Anesthesia Evaluation  Patient identified by MRN, date of birth, ID band Patient awake    Reviewed: Allergy & Precautions, H&P , NPO status , Patient's Chart, lab work & pertinent test results  Airway Mallampati: II       Dental   Pulmonary former smoker,  breath sounds clear to auscultation        Cardiovascular hypertension, + CAD, + Past MI and +CHF + pacemaker Rhythm:Regular Rate:Normal     Neuro/Psych    GI/Hepatic negative GI ROS, Neg liver ROS,   Endo/Other  diabetesHypothyroidism   Renal/GU Renal disease     Musculoskeletal   Abdominal   Peds  Hematology   Anesthesia Other Findings   Reproductive/Obstetrics                             Anesthesia Physical Anesthesia Plan  ASA: III  Anesthesia Plan: General   Post-op Pain Management:    Induction: Intravenous  Airway Management Planned: Oral ETT  Additional Equipment:   Intra-op Plan:   Post-operative Plan: Extubation in OR  Informed Consent: I have reviewed the patients History and Physical, chart, labs and discussed the procedure including the risks, benefits and alternatives for the proposed anesthesia with the patient or authorized representative who has indicated his/her understanding and acceptance.     Plan Discussed with: CRNA and Anesthesiologist  Anesthesia Plan Comments:         Anesthesia Quick Evaluation

## 2014-03-14 NOTE — Brief Op Note (Signed)
03/10/2014 - 03/14/2014  12:04 PM  PATIENT:  [name]  80 y.o. female  PRE-OPERATIVE DIAGNOSIS:  LEFT PAROTID CANCER  POST-OPERATIVE DIAGNOSIS:  LEFT PAROTID CANCER  PROCEDURE:  Procedure(s): LEFT PAROTIDECTOMY WITH FACIAL NERVE DISSECTION  (Left) LEFT NECK DISSECTION (Left)Left Total parotidectomy with facial nerve resection. Left tarsorhaphy.  SURGEON:  Surgeon(s) and Role:    * Rozetta Nunnery, MD - Primary    * Jodi Marble, MD - Assisting  PHYSICIAN ASSISTANT:   ASSISTANTS: Jodi Marble   ANESTHESIA:   general  EBL:  Total I/O In: 2000 [I.V.:2000] Out: 275 [Urine:225; Blood:50]  BLOOD ADMINISTERED:none  DRAINS: (10 to left neck) Hemovact drain(s) in the left neck with  Suction Open   LOCAL MEDICATIONS USED:  XYLOCAINE with EPI 7 cc  SPECIMEN:  Source of Specimen:  left parotid and left facial nerve  DISPOSITION OF SPECIMEN:  PATHOLOGY  COUNTS:  YES  TOURNIQUET:  * No tourniquets in log *  DICTATION: .Other Dictation: Dictation Number [id number]  PLAN OF CARE: Admit to inpatient   PATIENT DISPOSITION:  PACU - hemodynamically stable.   Delay start of Pharmacological VTE agent (>24hrs) due to surgical blood loss or risk of bleeding: yes

## 2014-03-14 NOTE — Anesthesia Postprocedure Evaluation (Signed)
  Anesthesia Post-op Note  Patient: [name]  Procedure(s) Performed: Procedure(s) with comments: LEFT PAROTIDECTOMY WITH FACIAL NERVE DISSECTION  (Left) - Total parotidectomy with facial nerve dissection, limited LEFT nerve dissection, LEFT tarsorrhaphy LEFT NECK DISSECTION (Left)  Patient Location: PACU  Anesthesia Type:General  Level of Consciousness: awake  Airway and Oxygen Therapy: Patient Spontanous Breathing  Post-op Pain: mild  Post-op Assessment: Post-op Vital signs reviewed  Post-op Vital Signs: Reviewed  Last Vitals:  Filed Vitals:   03/14/14 1409  BP: 111/33  Pulse: 67  Temp: 36.9 C  Resp: 19    Complications: No apparent anesthesia complications

## 2014-03-14 NOTE — Progress Notes (Signed)
Post op check AF VSS Awake alert Hemovac with mod output Wound flat Left facial paralysis Stable post op Begin artificial tears

## 2014-03-14 NOTE — Op Note (Signed)
[name], [name]           ACCOUNT NO.:  [id number]  MEDICAL RECORD NO.:  [id number]  LOCATION:  6N02C                        FACILITY:  [facility]  PHYSICIAN:  Leonides Sake. Lucia Gaskins, M.D.DATE OF BIRTH:  19-Oct-1928  DATE OF PROCEDURE:  03/14/2014 DATE OF DISCHARGE:                              OPERATIVE REPORT   PREOPERATIVE DIAGNOSIS:  Left parotid cancer.  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  OPERATION PERFORMED:  Left total parotidectomy with dissection of the facial nerve and limited left neck dissection.  SURGEON:  Leonides Sake. Lucia Gaskins, MD  ASSISTANT SURGEON:  Marikay Alar. Wolicki, MD  ANESTHESIA:  General endotracheal.  ESTIMATED BLOOD LOSS:  100 mL.  COMPLICATIONS:  None.  BRIEF CLINICAL NOTE:  [name] is an 80 year old female who has had a slowly enlarging painful left parotid mass for the past 2 months. This was initially treated as an infection with antibiotics, but after no resolution, fine needle aspirate and CT scan were performed.  CT scan showed a 3-4 cm mass in the left parotid and fine needle aspirate was suspicious for malignant cells.  Her facial nerve has functioned fairly well, but she has chronic pain and is taking hydrocodone regularly because of the pain involved with this.  On exam, she has a very firm mass just inferior and anterior to the ear and on review of the CT scan, this mass extends to just behind the angle of the mandible.  Of note, the patient had had a Warthin's tumor removed 7 years ago from the tail of the parotid gland.  She was taken to the operating room this time for left parotidectomy and facial nerve dissection, but cautioned her preoperatively about the risk to the facial nerve and if cancer involved about damage to the facial nerve involved with resecting it.  DESCRIPTION OF PROCEDURE:  After adequate endotracheal anesthesia, left neck was prepped with Betadine  solution and draped with sterile towels. The proposed incision site was marked out and injected with 6-7 mL of Xylocaine with epinephrine for hemostasis.  A standard parotid incision was made extending around the ear and down into the neck.  Subplatysmal flaps were elevated.  The mass was not adherent to the dermis.  However, the mass abutted adjacent to the bony and cartilaginous external ear canal posteriorly, but was not adherent to this area.  Dissection was then carried out down the cartilage and bony canal to where the pointer was identified.  The mass was extending back into the sternocleidomastoid muscle and a portion of the sternocleidomastoid muscle was removed.  The posterior belly of the digastric was identified and some of the posterior belly of digastric was removed which was, adherent to the mass.  The accessory nerve was identified as a random next to the jugular vein.  Because of difficulty finding the facial nerve trunk, some of the purple nerves were identified were dissected back more proximally to the trunk of the facial nerve, but these actually entered into the mass of the cancer.  Prior to doing any dissection, a frozen section was obtained from the mass and this was consistent with a poorly differentiated carcinoma.  At this point, really  could not definitely identify the trunk of the facial nerve and the mass was elevated off of the digastric muscle and off of the jugular vein deep and extended up toward the zygomatic arch.  After dissecting some of the peripheral branches back into the tumor, we can better identify where the facial nerve trunk was located and this had actually been cut on our dissection at deep.  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  At this point, the remaining purple branches were amputated and a generous margin was obtained around the mass that extended up to the posterior aspect of the ramus up toward the temporomandibular joint and up to the zygomatic arch.  Hemostasis was obtained with 2-0 silk ligatures involving the larger vessels.  The mass was removed and was marked with a long suture on the superior aspect which extended up to the zygomatic arch.  A short suture on the facial nerve, proximal portion in the specimen, and a medium length silk suture on the posterior margin which involved the sternocleidomastoid muscle. There was few enlarged level 2 and level 3 lymph nodes along the jugular vein and these were dissected out down to about the level of the laryngeal cartilage.  The 11th cranial nerve was identified and preserved.  Jugular vein was preserved and lymph nodes were dissected off of the jugular vein and sent as a separate specimen as the left neck dissection.  No posterior lymph nodes were dissected out as there was no enlarged palpable adenopathy in this area and nothing noted on CT scan. Likewise, the submandibular area was checked.  The submandibular gland was identified, but there was no enlarged submandibular nodes on palpation.  Following completion of the parotidectomy with dissection of the facial nerve, and the left neck dissection, I took another approximately 1 cm proximal facial nerve back toward the stylomastoid foramen using a drill, so the mastoid tip was drilled back as was thus marked small portion of the styloid process and using a right angle Beaver blade, the nerve was cut off of the styloid mastoid foramen and this was sent as a proximal nerve resection with a small suture on the most proximal portion of the facial nerve as it entered into the stylomastoid foramen.  This completed resection, had minimal bleeding. Wound was irrigated.  Cautery was used for few small  vessels.  A #10 Hemovac drain was placed through a separate stab incision inferiorly in the neck and the defect was closed with 3-0 chromic sutures subcutaneously and 5-0 nylon to reapproximate the skin edges.  Because of the resection of the facial nerve, a lateral tarsorrhaphy was placed with 2 interrupted 5-0 nylon sutures placed in the lateral portion of the left eye.  This completed the procedure.  The patient was awoken from anesthesia and transferred to recovery room, postop doing well. She will be admitted to the hospital for the next 3-4 days.          ______________________________ Leonides Sake Lucia Gaskins, M.D.     CEN/MEDQ  D:  03/14/2014  T:  03/14/2014  Job:  [id number]  cc:   Ileene Hutchinson T. Erik Obey, M.D.

## 2014-03-14 NOTE — Transfer of Care (Signed)
Immediate Anesthesia Transfer of Care Note  Patient: [name]  Procedure(s) Performed: Procedure(s) with comments: LEFT PAROTIDECTOMY WITH FACIAL NERVE DISSECTION  (Left) - Total parotidectomy with facial nerve dissection, limited LEFT nerve dissection, LEFT tarsorrhaphy LEFT NECK DISSECTION (Left)  Patient Location: PACU  Anesthesia Type:General  Level of Consciousness: awake and patient cooperative  Airway & Oxygen Therapy: Patient Spontanous Breathing and Patient connected to face mask oxygen  Post-op Assessment: Report given to PACU RN and Post -op Vital signs reviewed and stable  Post vital signs: Reviewed and stable  Complications: No apparent anesthesia complications

## 2014-03-15 LAB — GLUCOSE, CAPILLARY
GLUCOSE-CAPILLARY: 271 mg/dL — AB (ref 70–99)
Glucose-Capillary: 283 mg/dL — ABNORMAL HIGH (ref 70–99)
Glucose-Capillary: 265 mg/dL — ABNORMAL HIGH (ref 70–99)
Glucose-Capillary: 315 mg/dL — ABNORMAL HIGH (ref 70–99)

## 2014-03-15 NOTE — Progress Notes (Signed)
POD 1 AF VSS Awake alert . C/o sore throat but able to swallow Hemovac output yesterday 20 cc Trouble voiding yesterday with high residual and foley placed last night Neck wound doing well Encourage activity with assistance Possibly restart plavix tomorrow Stable post op course

## 2014-03-15 NOTE — Plan of Care (Signed)
Problem: Phase I Progression Outcomes Goal: OOB as tolerated unless otherwise ordered Outcome: Completed/Met Date Met:  03/15/14 Sat in recliner today for a few hours.

## 2014-03-15 NOTE — Plan of Care (Signed)
Problem: Phase I Progression Outcomes Goal: Sutures/staples intact Outcome: Completed/Met Date Met:  03/15/14 Bacitracin applied and incision line redressed.

## 2014-03-15 NOTE — Progress Notes (Signed)
Patient up to [facility] frequently voiding small amounts of urine. Patient scan for 456. Dr. Benjamine Mola on call for Dr. Lucia Gaskins page x 2 return call. Order for foley obtained. Foley #14 inserted obtained 379ml of clear yellow urine. Will continue to monitor.

## 2014-03-15 NOTE — Plan of Care (Signed)
Problem: Phase I Progression Outcomes Goal: Voiding-avoid urinary catheter unless indicated Outcome: Not Progressing Catheter reinserted last night due to frequency and voiding small amounts.

## 2014-03-16 LAB — GLUCOSE, CAPILLARY
Glucose-Capillary: 145 mg/dL — ABNORMAL HIGH (ref 70–99)
Glucose-Capillary: 211 mg/dL — ABNORMAL HIGH (ref 70–99)
Glucose-Capillary: 121 mg/dL — ABNORMAL HIGH (ref 70–99)
Glucose-Capillary: 153 mg/dL — ABNORMAL HIGH (ref 70–99)

## 2014-03-16 MED ORDER — CLOPIDOGREL BISULFATE 75 MG PO TABS
75.0000 mg | ORAL_TABLET | Freq: Every day | ORAL | Status: DC
  Administered 2014-03-16 – 2014-03-18 (×3): 75 mg via ORAL
  Filled 2014-03-16 (×3): qty 1

## 2014-03-16 NOTE — Progress Notes (Signed)
POD 2 AF VSS Doing well Still c/o pain. But resting well Taking pos well Wound looks good.  Hemovac drain removed Will d/c IV and Ancef. Restart Plavix Stable post op course. Consider discharge tomorrow.

## 2014-03-17 ENCOUNTER — Encounter (HOSPITAL_COMMUNITY): Payer: Self-pay | Admitting: Otolaryngology

## 2014-03-17 LAB — MDC_IDC_ENUM_SESS_TYPE_REMOTE
Battery Remaining Longevity: 147 mo
Brady Statistic AP VS Percent: 19 %
Brady Statistic AS VS Percent: 79 %
Lead Channel Impedance Value: 567 Ohm
Lead Channel Pacing Threshold Amplitude: 0.75 V
Lead Channel Pacing Threshold Pulse Width: 0.4 ms
Lead Channel Sensing Intrinsic Amplitude: 16 mV
Lead Channel Sensing Intrinsic Amplitude: 2.8 mV
Lead Channel Setting Pacing Amplitude: 2 V
Lead Channel Setting Pacing Pulse Width: 0.4 ms
Lead Channel Setting Sensing Sensitivity: 5.6 mV
MDC IDC MSMT BATTERY IMPEDANCE: 180 Ohm
MDC IDC MSMT BATTERY VOLTAGE: 2.8 V
MDC IDC MSMT LEADCHNL RA IMPEDANCE VALUE: 547 Ohm
MDC IDC MSMT LEADCHNL RA PACING THRESHOLD AMPLITUDE: 0.375 V
MDC IDC MSMT LEADCHNL RA PACING THRESHOLD PULSEWIDTH: 0.4 ms
MDC IDC SESS DTM: 20151204143821
MDC IDC SET LEADCHNL RV PACING AMPLITUDE: 2 V
MDC IDC STAT BRADY AP VP PERCENT: 2 %
MDC IDC STAT BRADY AS VP PERCENT: 1 %

## 2014-03-17 LAB — GLUCOSE, CAPILLARY
Glucose-Capillary: 123 mg/dL — ABNORMAL HIGH (ref 70–99)
Glucose-Capillary: 176 mg/dL — ABNORMAL HIGH (ref 70–99)
Glucose-Capillary: 150 mg/dL — ABNORMAL HIGH (ref 70–99)

## 2014-03-17 MED ORDER — HYDROCODONE-ACETAMINOPHEN 5-325 MG PO TABS: 1.0000 | ORAL_TABLET | Freq: Four times a day (QID) | ORAL | Status: DC | PRN

## 2014-03-17 MED ORDER — POLYVINYL ALCOHOL 1.4 % OP SOLN: 2.0000 [drp] | Freq: Three times a day (TID) | OPHTHALMIC | Status: AC

## 2014-03-17 NOTE — Discharge Instructions (Signed)
Take your regular meds. Use artificial tear drops for the left eye TID and prn Tylenol or hydrocodone 5 mg prn pain OK to get incision site wet. Can apply antibiotic ointment to incision site. Call Dr Lucia Gaskins for follow up appt in 6-7 days or if you have any questions.

## 2014-03-17 NOTE — Clinical Social Work Psychosocial (Signed)
Clinical Social Work Department BRIEF PSYCHOSOCIAL ASSESSMENT 03/17/2014  Patient:  [name], [name]     Account Number:  [id number]     Admit date:  03/14/2014  Clinical Social Worker:  Domenica Reamer, Amorita  Date/Time:  03/17/2014 11:08 AM  Referred by:  Physician  Date Referred:  03/17/2014 Referred for  SNF Placement   Other Referral:   Interview type:  Patient Other interview type:   also spoke with friend- [name]    PSYCHOSOCIAL DATA Living Status:  ALONE Admitted from facility:   Level of care:   Primary support name:  [name] Primary support relationship to patient:  FRIEND Degree of support available:   Patient has high level of emotional support from [place] but no family nearby able to take care of her physical needs during the day- [name] reports that patient works with [place] and Nature conservation officer (who provide some in home care)    CURRENT CONCERNS Current Concerns  Post-Acute Placement   Other Concerns:    SOCIAL WORK ASSESSMENT / PLAN CSW spoke with patient and patients friend, [name], at bedside to discuss SNF.  Patient is agreeable to SNF and prefers [facility].  Patient reports having stayed at [facility] in the past but wants [facility] this time because that is where her home health nurse is recommending- patients second choice is [facility].   Assessment/plan status:  Psychosocial Support/Ongoing Assessment of Needs Other assessment/ plan:   FL2 update   Information/referral to community resources:   [facility]    PATIENT'S/FAMILY'S RESPONSE TO PLAN OF CARE: Patient is agreeable to plan to go to SNF short term but is hopeful that it will be a quick stay and she can return to living at home.       Domenica Reamer, [place] Social Worker [phone]

## 2014-03-17 NOTE — Care Management Note (Signed)
  Page 1 of 1   03/17/2014     10:35:34 AM CARE MANAGEMENT NOTE 03/17/2014  Patient:  [name], [name]   Account Number:  [id number]  Date Initiated:  03/17/2014  Documentation initiated by:  Magdalen Spatz  Subjective/Objective Assessment:     Action/Plan:   Anticipated DC Date:  03/18/2014   Anticipated DC Plan:           Choice offered to / List presented to:             Status of service:   Medicare Important Message given?  YES (If response is "NO", the following Medicare IM given date fields will be blank) Date Medicare IM given:  03/17/2014 Medicare IM given by:  Magdalen Spatz Date Additional Medicare IM given:   Additional Medicare IM given by:    Discharge Disposition:    Per UR Regulation:  Reviewed for med. necessity/level of care/duration of stay  If discussed at [place] of Stay Meetings, dates discussed:    Comments:  03-17-14 Discussed discharge planning with patient and her friend [name] [phone] or [phone] at bedside . Patient would like to go to [facility] at discharge . PT / OT ordered called Abigail Butts in office and SW aware. Magdalen Spatz RN BSN [phone]

## 2014-03-17 NOTE — Progress Notes (Signed)
OT Cancellation Note  Patient Details Name: [name] MRN: [id number] DOB: 1928-12-11   Cancelled Treatment:    Reason Eval/Treat Not Completed: Other (comment) Attempted to see pt. Pt eating lunch and requested for this therapist to return later. [place], OTR/L  [phone] 03/17/2014 03/17/2014, 2:07 PM

## 2014-03-17 NOTE — Progress Notes (Signed)
Occupational Therapy Evaluation Patient Details Name: [name] MRN: [id number] DOB: [birth year] Today's Date: 03/17/2014    History of Present Illness 80 yo female s/p 03/14/14 L parotidectomy with facial nerve dissection   Clinical Impression   Pt presents with generalized weakness and decreased independence with ADL and mobility and would benefit from rehab at SNF prior to return home. Pt very motivated to becoming more independent. All further needs can be addressed at SNF. OT signing off.    Follow Up Recommendations  SNF;Supervision/Assistance - 24 hour    Equipment Recommendations  None recommended by OT    Recommendations for Other Services       Precautions / Restrictions Precautions Precautions: Fall Precaution Comments: L facial N paralysis Restrictions Weight Bearing Restrictions: No      Mobility Bed Mobility Overal bed mobility: Needs Assistance Bed Mobility: Supine to Sit;Sit to Supine     Supine to sit: Supervision;HOB elevated Sit to supine: Supervision;HOB elevated    Transfers Overall transfer level: Needs assistance Equipment used: Rolling walker (2 wheeled) Transfers: Sit to/from [facility] Sit to Stand: Min assist Stand pivot transfers: Min assist       General transfer comment: Min A for steadying during grooming task. Mod vc for correct positioning in RW.     Balance Overall balance assessment: Needs assistance Sitting-balance support: Bilateral upper extremity supported;Feet supported Sitting balance-Leahy Scale: Good     Standing balance support: During functional activity Standing balance-Leahy Scale: Fair                              ADL Overall ADL's : Needs assistance/impaired Eating/Feeding: Modified independent   Grooming: Set up;Standing   Upper Body Bathing: Set up;Sitting   Lower Body Bathing: Moderate assistance;Sit to/from stand   Upper Body Dressing : Set up;Sitting    Lower Body Dressing: Moderate assistance;Sit to/from stand   Toilet Transfer: Minimal assistance;Ambulation;Comfort height toilet   Toileting- Clothing Manipulation and Hygiene: Min guard;Sit to/from stand       Functional mobility during ADLs: Minimal assistance;Rolling walker;Cueing for safety (cuing for position in RW) General ADL Comments: Pt states she gets fatigued more easily     Vision                 Additional Comments: L eye delayed closing   Perception     Praxis      Pertinent Vitals/Pain Pain Assessment: No/denies pain     Hand Dominance Right   Extremity/Trunk Assessment Upper Extremity Assessment Upper Extremity Assessment: Generalized weakness   Lower Extremity Assessment Lower Extremity Assessment: Generalized weakness   Cervical / Trunk Assessment Cervical / Trunk Assessment: Kyphotic   Communication Communication Communication: HOH   Cognition Arousal/Alertness: Awake/alert Behavior During Therapy: WFL for tasks assessed/performed Overall Cognitive Status: No family/caregiver present to determine baseline cognitive functioning                     General Comments       Exercises       Shoulder Instructions      Home Living Family/patient expects to be discharged to:: Skilled nursing facility Living Arrangements: Alone Available Help at Discharge: Personal care attendant;Other (Comment) (had [facility] aide come every day for 2 hours to assist with ADLs. ) Type of Home: House Home Access: Stairs to enter [employer] of Steps: 3 Entrance Stairs-Rails: Can reach both Home Layout: One level  Bathroom Shower/Tub: Risk analyst characteristics: Architectural technologist: Standard Bathroom Accessibility: Yes How Accessible: Accessible via walker Home Equipment: [place] - 2 wheels;Cane - single point;Shower seat - built in   Additional Comments: Pt states that she was getting help from [facility] for  bathing and dressing but was able to get into walk in shower.       Prior Functioning/Environment Level of Independence: Needs assistance    ADL's / Homemaking Assistance Needed: AA aide assisted, but pt was able to do for herself when [facility] aide not there   Comments: Again, was getting help from aide for bathing and dressing.     OT Diagnosis: Generalized weakness   OT Problem List: Decreased strength;Decreased activity tolerance;Decreased safety awareness;Decreased knowledge of use of DME or AE;Obesity   OT Treatment/Interventions:      OT Goals(Current goals can be found in the care plan section) Acute Rehab OT Goals Patient Stated Goal: to go to rehab   OT Frequency:     Barriers to D/C:            Co-evaluation              End of Session Equipment Utilized During Treatment: Gait belt;Rolling walker Nurse Communication: Mobility status  Activity Tolerance: Patient tolerated treatment well Patient left: in bed;with call bell/phone within reach   Time: 1445-1505 OT Time Calculation (min): 20 min Charges:  OT General Charges $OT Visit: 1 Procedure OT Evaluation $Initial OT Evaluation Tier I: 1 Procedure OT Treatments $Self Care/Home Management : 8-22 mins G-Codes:    Caige Almeda,HILLARY 03-31-2014, 3:10 PM   [facility], OTR/L  [phone] 2014/03/31

## 2014-03-17 NOTE — Clinical Social Work Note (Addendum)
Patients first choice- [facility] is unavailable.  [facility] (pts 2nd choice)does have available.   CSW informed Dr of discharge options- Dr anticipates to DC pt tomorrow morning to [facility].  CSW will continue to follow.  Domenica Reamer, [place] Social Worker [phone]

## 2014-03-17 NOTE — Progress Notes (Signed)
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 She'll need follow up with my office in 1 week and will probably require radiation therapy for further treatment of her cancer in 3 - 4 weeks.

## 2014-03-17 NOTE — Clinical Social Work Placement (Addendum)
Clinical Social Work Department CLINICAL SOCIAL WORK PLACEMENT NOTE 03/17/2014  Patient:  MARLET, KORTE  Account Number:  [id number] Admit date:  03/14/2014  Clinical Social Worker:  Domenica Reamer, CLINICAL SOCIAL WORKER  Date/time:  03/17/2014 11:11 AM  Clinical Social Work is seeking post-discharge placement for this patient at the following level of care:   SKILLED NURSING   (*CSW will update this form in Epic as items are completed)   03/17/2014  Patient/family provided with [place] Department of Clinical Social Work's list of facilities offering this level of care within the geographic area requested by the patient (or if unable, by the patient's family).  03/17/2014  Patient/family informed of their freedom to choose among providers that offer the needed level of care, that participate in Medicare, Medicaid or managed care program needed by the patient, have an available bed and are willing to accept the patient.  03/17/2014  Patient/family informed of [facility]' ownership interest in [facility], as well as of the fact that they are under no obligation to receive care at this facility.  PASARR submitted to EDS on 03/17/2014 PASARR number received on 03/17/2014  FL2 transmitted to all facilities in geographic area requested by pt/family on  03/17/2014 FL2 transmitted to all facilities within larger geographic area on   Patient informed that his/her managed care company has contracts with or will negotiate with  certain facilities, including the following:     Patient/family informed of bed offers received:  03/18/14 Patient chooses bed at [facility] Physician recommends and patient chooses bed at    Patient to be transferred to  [facility] on  03/18/14 Patient to be transferred to facility by [facility] Patient and family notified of transfer on 03/18/14 Name of family member notified:  [name] (friend)  The following physician request were entered in  Epic:   Additional Comments: Domenica Reamer, [place] Social Worker [phone]

## 2014-03-17 NOTE — Progress Notes (Signed)
POD 3 Doing well. Wounds look good. Plan to discharge to nursing home in am Continue with regular meds and hydrocodone prn pain Will have her follow up in my office next week. Discharge dictated # [id number]

## 2014-03-17 NOTE — Evaluation (Signed)
Physical Therapy Evaluation Patient Details Name: AERON LHEUREUX MRN: [id number] DOB: 1929-02-09 Today's Date: 03/17/2014   History of Present Illness  80 yo female s/p 03/14/14 L parotidectomy with facial nerve dissection  Clinical Impression  Pt presents with decreased endurance, decreased mobility, decreased overall strength.  Tolerated gait in hallway with RW at min A level.  No overt LOB, however mod cues for safe use of RW.  Feel she will benefit from continued PT in acute services with PT recommending SNF for follow up at D/C for increased safety as pt lives alone with [facility] aide coming only 2 hours a day.  Pt in agreement.     Follow Up Recommendations SNF;Supervision for mobility/OOB    Equipment Recommendations  None recommended by PT    Recommendations for Other Services OT consult     Precautions / Restrictions Precautions Precautions: Fall Precaution Comments: L facial N paralysis Restrictions Weight Bearing Restrictions: No      Mobility  Bed Mobility Overal bed mobility: Needs Assistance Bed Mobility: Supine to Sit;Sit to Supine     Supine to sit: HOB elevated;Min assist     General bed mobility comments: Pt requires min A to elevate trunk into sitting with HOB elevated and with use of rails. cues for sequencing and technique.  Pt able to return to supine with HOB flat and without rails at S level.   Transfers Overall transfer level: Needs assistance Equipment used: Rolling walker (2 wheeled) Transfers: Sit to/from Stand Sit to Stand: Min assist         General transfer comment: Min A for steadying in standing with cues for hand placement and safety.  Perfomed transfer from bed and from regular toilet in restroom.  Mod cues for keeping RW with her, esp when at sink and when turning to sit.   Ambulation/Gait Ambulation/Gait assistance: Min assist Ambulation Distance (Feet): 150 Feet Assistive device: Rolling walker (2 wheeled) Gait  Pattern/deviations: Step-through pattern;Decreased stride length;Narrow base of support;Trunk flexed Gait velocity: decreased   General Gait Details: Pt requires cues for maintaining position inside of RW, esp when turning.  No overt LOB, but does require continued cues for safety with RW throughout.   Stairs            Wheelchair Mobility    Modified Rankin (Stroke Patients Only)       Balance Overall balance assessment: Needs assistance Sitting-balance support: Bilateral upper extremity supported;Feet supported Sitting balance-Leahy Scale: Good     Standing balance support: During functional activity;Bilateral upper extremity supported Standing balance-Leahy Scale: Fair                               Pertinent Vitals/Pain Pain Assessment: No/denies pain    Home Living Family/patient expects to be discharged to:: Skilled nursing facility Living Arrangements: Alone Available Help at Discharge: Personal care attendant;Other (Comment) (had [facility] aide come every day for 2 hours to assist with ADLs. ) Type of Home: House Home Access: Stairs to enter Entrance Stairs-Rails: Can reach both Entrance Stairs-Number of Steps: 3 Home Layout: One level Home Equipment: [place] - 2 wheels;Cane - single point;Shower seat - built in Additional Comments: Pt states that she was getting help from [place] for bathing and dressing but was able to get into walk in shower.     Prior Function Level of Independence: Needs assistance         Comments: Again, was getting help  from aide for bathing and dressing.      Hand Dominance        Extremity/Trunk Assessment               Lower Extremity Assessment: Generalized weakness      Cervical / Trunk Assessment: Kyphotic  Communication   Communication: HOH  Cognition Arousal/Alertness: Awake/alert Behavior During Therapy: WFL for tasks assessed/performed Overall Cognitive Status: No family/caregiver present to  determine baseline cognitive functioning                      General Comments      Exercises        Assessment/Plan    PT Assessment Patient needs continued PT services  PT Diagnosis Difficulty walking;Generalized weakness   PT Problem List Decreased strength;Decreased activity tolerance;Decreased balance;Decreased mobility;Decreased knowledge of use of DME;Decreased safety awareness  PT Treatment Interventions DME instruction;Gait training;Functional mobility training;Therapeutic activities;Therapeutic exercise;Balance training;Patient/family education   PT Goals (Current goals can be found in the Care Plan section) Acute Rehab PT Goals Patient Stated Goal: to go to rehab  PT Goal Formulation: With patient Time For Goal Achievement: 03/24/14 Potential to Achieve Goals: Good    Frequency Min 3X/week   Barriers to discharge Decreased caregiver support      Co-evaluation               End of Session   Activity Tolerance: Patient limited by fatigue Patient left: in bed;with call bell/phone within reach Nurse Communication: Mobility status         Time: 1131-1200 PT Time Calculation (min) (ACUTE ONLY): 29 min   Charges:   PT Evaluation $Initial PT Evaluation Tier I: 1 Procedure PT Treatments $Gait Training: 8-22 mins $Therapeutic Activity: 8-22 mins   PT G Codes:          Denice Bors 03/17/2014, 12:19 PM

## 2014-03-18 ENCOUNTER — Encounter: Payer: Self-pay | Admitting: Cardiology

## 2014-03-18 LAB — GLUCOSE, CAPILLARY
GLUCOSE-CAPILLARY: 70 mg/dL (ref 70–99)
GLUCOSE-CAPILLARY: 166 mg/dL — AB (ref 70–99)

## 2014-03-18 NOTE — Discharge Summary (Signed)
Added discharge meds to dc dictation  dictated # [phone]

## 2014-03-18 NOTE — Discharge Summary (Signed)
NAMELATASIA, SILBERSTEIN           ACCOUNT NO.:  [id number]  MEDICAL RECORD NO.:  [id number]  LOCATION:  6N02C                        FACILITY:  [facility]  PHYSICIAN:  Leonides Sake. Lucia Gaskins, M.D.DATE OF BIRTH:  25-Dec-1928  DATE OF ADMISSION:  03/14/2014 DATE OF DISCHARGE:  03/18/2014                              DISCHARGE SUMMARY   DIAGNOSES: 1. Left parotid cancer. 2. Coronary artery disease. 3. Hypertension. 4. History of transient ischemic attacks. 5. Pacemaker. 6. Insulin-dependent diabetes.  OPERATIONS DURING THIS HOSPITALIZATION:  Left total parotidectomy with facial nerve resection and limited left neck dissection on March 14, 2014.  HOSPITAL COURSE:  The patient was admitted via the operating room on March 14, 2014, at which time, she underwent a left radical parotidectomy and facial nerve resection for large left parotid cancer involving the facial nerve and a limited left neck dissection.  She tolerated the surgery well.  Postoperatively was admitted to the surgical floor where she received perioperative antibiotic, Ancef for 3 days.  She had a Hemovac drain placed and this was removed on her second postoperative day after minimal output.  She had total left nerve resection along with the large left parotid cancer and had left lateral tarsorrhaphy and received eye drops of artificial tears 3 times a day and tolerated these well with minimal eye complaints.  Neck wound was healing well.  Because of the patient's home social situation, it was felt best that she was discharged home to an extended care facility at [facility] home and the patient was subsequently discharged home on her fifth postoperative day, on March 18, 2014.  At the time of discharge, she was eating adequately, although she complained of some of the sore throat and still having some left neck pain.  She received hydrocodone for the pain.  She resumed her Plavix, which was stopped preoperatively and  resumed the Plavix on her second postoperative day. She will continue with regular medications in addition to artificial tears and hydrocodone for pain.  We will have the patient to follow up in my office in 6 days for recheck and removal of the neck sutures. Will probably require postoperative radiation therapy for further treatment of her parotid cancer.          ______________________________ Leonides Sake. Lucia Gaskins, M.D.    CEN/MEDQ  D:  03/17/2014  T:  03/18/2014  Job:  [id number]  cc:   Hal T. Stoneking, M.D.

## 2014-03-18 NOTE — Progress Notes (Signed)
Called report to Deneise Lever, Therapist, sports at [facility]. Awaiting discharge by ambulance.

## 2014-03-18 NOTE — Clinical Social Work Note (Addendum)
Patient will discharge to [facility] SNF Report #[phone] Anticipated discharge date:12/15 Family notified: Astronomer by [employer]- scheduled for 2pm  CSW signing off.  Domenica Reamer, [place] Social Worker [phone]

## 2014-03-19 NOTE — Discharge Summary (Signed)
NAMEJIANA, LEMAIRE           ACCOUNT NO.:  [id number]  MEDICAL RECORD NO.:  [id number]  LOCATION:  6N02C                        FACILITY:  [facility]  PHYSICIAN:  Leonides Sake. Lucia Gaskins, M.D.DATE OF BIRTH:  07-21-1928  DATE OF ADMISSION:  03/14/2014 DATE OF DISCHARGE:  03/18/2014                              DISCHARGE SUMMARY   ADDENDUM:  DISCHARGE MEDICATIONS:  The patient's discharge medications include aspirin 81 mg chewable tablet 1 daily, calcitriol 0.25 mcg by mouth daily, Plavix 75 mg tablet daily, Lasix 20 mg by mouth every other day, Neurontin 300 mg tablet 2 times daily, hydrocodone 5/325 mg tablet by mouth q.6 hours p.r.n. pain, Lantus insulin 38 units subcu at bedtime, Imdur 30 mg tablet daily,  Synthroid 25 mcg by mouth daily, lisinopril 5 mg daily, Lopressor 25 mg tablet b.i.d., Nitrostat 0.4 mg sublingual tablet under tongue q.5 minutes p.r.n. chest pain, Ditropan-XL 10 mg by mouth every other day, Zocor 20 mg tablet daily every evening, Tylenol 650 mg tablet p.r.n. pain, artificial tears 2 drops left eye t.i.d. and p.r.n. eye discomfort.          ______________________________ Leonides Sake. Lucia Gaskins, M.D.     CEN/MEDQ  D:  03/18/2014  T:  03/18/2014  Job:  [id number]

## 2014-03-20 ENCOUNTER — Telehealth: Payer: Self-pay | Admitting: *Deleted

## 2014-03-20 NOTE — Telephone Encounter (Signed)
Placed introductory call to patient.  LVM, asked her to return my call.  Gayleen Orem, RN, BSN, [place] at [place] [phone]

## 2014-03-25 NOTE — Addendum Note (Signed)
Addendum  created 03/25/14 1737 by Finis Bud, MD   Modules edited: Anesthesia Attestations

## 2014-04-02 ENCOUNTER — Telehealth: Payer: Self-pay | Admitting: *Deleted

## 2014-04-02 NOTE — Telephone Encounter (Signed)
Called patient to discuss her attendance at the 04/09/14 [facility].  LVM asking her to return my call.  Gayleen Orem, RN, BSN, [place] at [place] [phone]

## 2014-04-03 ENCOUNTER — Encounter: Payer: Self-pay | Admitting: Radiation Oncology

## 2014-04-03 ENCOUNTER — Telehealth: Payer: Self-pay | Admitting: *Deleted

## 2014-04-03 NOTE — Progress Notes (Signed)
Head and Neck Cancer Location of Tumor / Histology: left parotid  Patient presented 2 months ago with symptoms of: sharp, stabbing bilateral ear pain, left > right, swelling to lymph nodes on left side of neck  Biopsies of  (if applicable) revealed:  27/51/70 Diagnosis 1. Salivary gland, biopsy, Left Parotid - POORLY DIFFERENTIATED CARCINOMA, CONSISTENT WITH SQUAMOUS CELL CARCINOMA. 2. Nerve, biopsy, Distal facial - POORLY DIFFERENTIATED CARCINOMA, CONSISTENT WITH SQUAMOUS CELL CARCINOMA.   - PERINEURAL INVASION IS IDENTIFIED. 3. Nerve, biopsy, Proximal facial - POORLY DIFFERENTIATED CARCINOMA, CONSISTENT WITH SQUAMOUS CELL CARCINOMA. - SEE COMMENT. 4. Lymph nodes, radical neck dissection, Level 2 and 3 - METASTATIC CARCINOMA IN 2 OF 11 LYMPH NODES (2/11) WITH FOCAL EXTRACAPSULAR EXTENSION. 5. Parotid gland, Left - INVASIVE POORLY DIFFERENTIATED CARCINOMA, CONSISTENT WITH SQUAMOUS CELL CARCINOMA, SPANNING 4.2 CM. - CARCINOMA IS BROADLY PRESENT AT THE SUPERIOR/DEEP MARGIN (LONG SUTURE), BROADLY PRESENT AT THE MEDIAL MARGIN NEAR FACIAL NERVE (SHORT SUTURE), AND BROADLY PRESENT AT THE INFERIOR MARGIN. - PERINEURAL INVASION IS IDENTIFIED, DIFFUSE. - THERE IS NO EVIDENCE OF CARCINOMA IN 1 OF 1 LYMPH NODE (0/1). - SEE ONCOLOGY TABLE BELOW. 6. Nerve, biopsy, most proximal facial nerve - BENIGN PERIPHERAL NERVE WITH ASSOCIATED FIBROADIPOSE TISSUE. - THERE IS NO EVIDENCE OF MALIGNANCY.  Nutrition Status Yes No Comments  Weight changes? [x]  []  15 lbs in past 7 mos  Swallowing concerns? [x]  []  Numbness on left side of mouth  PEG? []  [x]     Referrals Yes No Comments  Social Work? []  [x]    Dentistry? []  [x]    Swallowing therapy? []  [x]    Nutrition? []  [x]    Med/Onc? []  [x]     Safety Issues Yes No Comments  Prior radiation? []  [x]    Pacemaker/ICD? [x]  []  Does not have card  Possible current pregnancy? []  [x]    Is the patient on methotrexate? []  [x]     Tobacco/Marijuana/Snuff/ETOH use: 1/2  -1 ppd, quit 03/01/12  Past/Anticipated interventions by otolaryngology, if any: left radical neck dissection  Past/Anticipated interventions by medical oncology, if any: to see med onc dr in [facility] 04/09/14  Current Complaints / other details:  single, lives alone but currently resides at [place], does not drive, has no family, never married, no children    No chief complaint on file.  Spoke with Tobin Chad RN for Dr Sallyanne Kuster, pt's cardiologist. Advised her the [employer] pacemaker form will be faxed for completion. She stated Dr Sallyanne Kuster will be in office this Friday.  Form faxed to [facility], [place], [place]. Tobin Chad verbalized understanding.

## 2014-04-03 NOTE — Telephone Encounter (Signed)
Spoke with patient's friend, Jeanie Sewer.  She informed that patient is at [facility].  I confirmed with her that patient is scheduled to attend the [facility] next week Wednesday with an arrival time of 12:15.  She indicated she and patient's friend Pearlean Brownie will be joining patient for appt.  I explained purpose and format of the [place], confirmed her understanding of [place] location, explained arrival and check-in procedure.  She verbalized understanding of information provided, understands she can contact me prior to [employer] if she has questions.  Gayleen Orem, RN, BSN, [place] at [place] [phone]

## 2014-04-08 ENCOUNTER — Telehealth: Payer: Self-pay | Admitting: *Deleted

## 2014-04-08 NOTE — Telephone Encounter (Signed)
LVM with patient friend Gershon Crane with reminder of 12:00 arrival for [facility].  Gayleen Orem, RN, BSN, [place] at [place] [phone]

## 2014-04-09 ENCOUNTER — Encounter: Payer: Self-pay | Admitting: *Deleted

## 2014-04-09 ENCOUNTER — Ambulatory Visit
Admission: RE | Admit: 2014-04-09 | Discharge: 2014-04-09 | Disposition: A | Discharge status: Home / Self Care | Payer: No Typology Code available for payment source | Source: Ambulatory Visit | Attending: Radiation Oncology | Admitting: Radiation Oncology

## 2014-04-09 ENCOUNTER — Ambulatory Visit: Payer: Medicare Other | Admitting: Nutrition

## 2014-04-09 ENCOUNTER — Ambulatory Visit: Payer: Medicare Other | Admitting: Physical Therapy

## 2014-04-09 ENCOUNTER — Encounter: Payer: Self-pay | Admitting: Radiation Oncology

## 2014-04-09 ENCOUNTER — Telehealth: Payer: Self-pay | Admitting: Cardiovascular Disease

## 2014-04-09 VITALS — BP 136/52 | HR 63 | Temp 98.2°F | Resp 20 | Wt 173.9 lb

## 2014-04-09 DIAGNOSIS — E039 Hypothyroidism, unspecified: Secondary | ICD-10-CM | POA: Diagnosis not present

## 2014-04-09 DIAGNOSIS — C44320 Squamous cell carcinoma of skin of unspecified parts of face: Secondary | ICD-10-CM | POA: Insufficient documentation

## 2014-04-09 DIAGNOSIS — C44300 Unspecified malignant neoplasm of skin of unspecified part of face: Secondary | ICD-10-CM

## 2014-04-09 DIAGNOSIS — R634 Abnormal weight loss: Secondary | ICD-10-CM | POA: Diagnosis not present

## 2014-04-09 HISTORY — DX: Other specified disorders of bone density and structure, unspecified site: M85.80

## 2014-04-09 HISTORY — DX: Unspecified malignant neoplasm of skin, unspecified: C44.90

## 2014-04-09 HISTORY — DX: Chronic obstructive pulmonary disease, unspecified: J44.9

## 2014-04-09 HISTORY — DX: Malignant (primary) neoplasm, unspecified: C80.1

## 2014-04-09 NOTE — Progress Notes (Signed)
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 Squamous cell skin cancer, face 173.32 C44.320 CT Chest Wo Contrast     CT Soft Tissue Neck Wo Contrast     TSH     Ambulatory referral to Social Work  2. Loss of weight 783.21 R63.4   3. Hypothyroidism, unspecified hypothyroidism type 244.9 E03.9 TSH  4. Skin cancer of face 173.31 C44.310     HISTORY OF PRESENT ILLNESS::[name] is a 81 y.o. female who presented with multiple squamous cell carcinomas of the face, most recently the left temple.  She also had a prior Warthin's tumor on Left. She then presented 2 months ago with symptoms of: sharp, stabbing bilateral ear pain, left > right, swelling to lymph nodes on left side of neck. She saw Dr. Lucia Higgins and ultimately underwent CT of neck without contrast on 03/03/14.  This showed  Intermediate density left parotid lesion is less well-circumscribed and larger than that resected in 2009, measuring 32 x 25 x 41 mm. With a more superficially located 8-9 mm intermediate density round nodule (subjacent to the skin marker), which could be a satellite lesion or a reactive intra-parotid node.  She ultimately underwent surgical resection of the parotid gland and cervical nodes (left) as below on 03-14-14. Her pathology and imaging were reviewed at ENT tumor board.    She has multiple co morbidities and poor performance status (ECOG 3).  She is very hard of hearing.  She doesn't use her hearing aid. 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  She had left facial numbness  post operatively. No pain.  Weight loss over the past year, but cannot quantify.  Left pacemaker.  Left chronic vision loss. Quit smoking in 2013. She is with two friends today.   Diagnosis 1. Salivary gland, biopsy, Left Parotid - POORLY DIFFERENTIATED CARCINOMA, CONSISTENT WITH SQUAMOUS CELL CARCINOMA. 2. Nerve, biopsy, Distal facial - POORLY DIFFERENTIATED CARCINOMA, CONSISTENT WITH SQUAMOUS CELL CARCINOMA. - PERINEURAL INVASION IS IDENTIFIED. 3. Nerve, biopsy, Proximal facial - POORLY DIFFERENTIATED CARCINOMA, CONSISTENT WITH SQUAMOUS CELL CARCINOMA. - SEE COMMENT. 4. Lymph nodes, radical neck dissection, Level 2 and 3 - METASTATIC CARCINOMA IN 2 OF 11 LYMPH NODES (2/11) WITH FOCAL EXTRACAPSULAR EXTENSION. 5. Parotid gland, Left - INVASIVE POORLY DIFFERENTIATED CARCINOMA, CONSISTENT WITH SQUAMOUS CELL CARCINOMA, SPANNING 4.2 CM. - CARCINOMA IS BROADLY PRESENT AT THE SUPERIOR/DEEP MARGIN (LONG SUTURE), BROADLY PRESENT AT THE MEDIAL MARGIN NEAR FACIAL NERVE (SHORT SUTURE), AND BROADLY PRESENT AT THE INFERIOR MARGIN. - PERINEURAL INVASION IS IDENTIFIED, DIFFUSE. - THERE IS NO EVIDENCE OF CARCINOMA IN 1 OF 1 LYMPH NODE (0/1). - SEE ONCOLOGY TABLE BELOW. 6. Nerve, biopsy, most proximal facial nerve - BENIGN PERIPHERAL NERVE WITH ASSOCIATED FIBROADIPOSE TISSUE. - THERE IS NO EVIDENCE OF MALIGNANCY. Microscopic Comment 3. The carcinoma is best seen on the frozen section slide. 5. ONCOLOGY TABLE - SALIVARY GLAND 1. Specimen: Left parotid gland, left neck lymph nodes and left facial nerve. 2. Procedure: Total parotidectomy, left neck dissection, level II and III and left facial nerve biopsies. 3. Tumor site and laterality: Left  parotid gland. 4. Tumor focality: Unifocal 1 of 3 Duplicate copy FINAL for Higgins, Natasha N [phone]) Microscopic Comment(continued) 5. Maximum tumor size (cm): 4.2 cm (gross measurement). 6. Histologic type: Squamous cell carcinoma 7. Grade: High  grade. 8. Margins: Multiple margins are broadly positive for invasive carcinoma (see diagnostic section above). 9. Perineural invasion: Present, diffuse 10. Lymph-Vascular invasion: Not definitively identified. 11. Lymph nodes: # examined - 12; # positive - 2; extracapsular extension - Focal 12. TNM code: pT4a (due to involvement of facial nerve), pN2b   PREVIOUS RADIATION THERAPY: No  PAST MEDICAL HISTORY:  has a past medical history of CHF (congestive heart failure); Coronary artery disease; Hypertension; Pacemaker (09/2010); Hyperlipidemia; Hypothyroidism; Carotid bruit (10/25/2002); Myocardial infarction; Insulin dependent diabetes mellitus; Chronic kidney disease; Cancer (03/2014); Osteopenia; COPD (chronic obstructive pulmonary disease); Stroke (about 20 years ago ); and Skin cancer.    PAST SURGICAL HISTORY: Past Surgical History  Procedure Laterality Date  . Insert / replace / remove pacemaker  09/21/10    medtronic Adapta   . Coronary angioplasty with stent placement  09/13/10    preserved LV function, PTCA/stenting of the RCA with insertion of two stents, 2.75x67mm stent proximal RCA and 2.25x69mm at the distal RCA, post stent dilatation up to 2.64mm  . Cardiac catheterization  05/28/10    EF 65%, medical therapy  . Coronary angioplasty with stent placement  06/04/09    PCI, stenting of the mid and prox RCA with Promus DES  . Cardiac catheterization  06/03/09    PCI, stenting of the major oblique marginal arteries off the circ  . Coronary angioplasty with stent placement  10/16/2002    stenting of mid RCA with 2.75x72mm Cypher stent deployed at 16 atmospheres; EF 60  . Coronary angioplasty with stent placement  11/29/2001    2.75x66mm cutting balloon angioplasty of the in stent restenosis of RCA, bracytherapy of the in stend restenotic segment of mid RCA  . Cardiac catheterization  11/28/2001    high grade isten restenosis of RCA stent placed 03/02/01, proceed with cutting balloon 8/28  .  Coronary angioplasty with stent placement  03/02/2001    ptca and stenting of RCA (2.75x25mm Ceta) after predilatation with a 2.5x65mm CrossSail and OpenSail balloon; EF 60%  . Tumor removal      on side of neck  . Parotidectomy Left 03/14/2014    Procedure: LEFT PAROTIDECTOMY WITH FACIAL NERVE DISSECTION ;  Surgeon: Natasha Nunnery, MD;  Location: [place];  Service: ENT;  Laterality: Left;  Total parotidectomy with facial nerve dissection, limited LEFT nerve dissection, LEFT tarsorrhaphy  . Radical neck dissection Left 03/14/2014    Procedure: LEFT NECK DISSECTION;  Surgeon: Natasha Nunnery, MD;  Location: [facility] OR;  Service: ENT;  Laterality: Left;    FAMILY HISTORY: family history includes Asthma in her mother; Diabetes in her father; Heart failure in her father; Stroke in her mother.  SOCIAL HISTORY:  reports that she quit smoking about 2 years ago. She does not have any smokeless tobacco history on file. She reports that she does not drink alcohol or use illicit drugs.  ALLERGIES: Review of patient's allergies indicates no known allergies.  MEDICATIONS:  Current Outpatient Prescriptions  Medication Sig Dispense Refill  . aspirin 81 MG chewable tablet Chew 81 mg by mouth daily.      . bacitracin 500 UNIT/GM ointment Apply 1 application topically 2 (two) times daily.    . calcitRIOL (ROCALTROL) 0.25 MCG capsule Take 0.25 mcg by mouth  daily.    . clopidogrel (PLAVIX) 75 MG tablet Take 1 tablet (75 mg total) by mouth daily. 30 tablet 9  . erythromycin ophthalmic ointment 1 application at bedtime.    . furosemide (LASIX) 40 MG tablet Take 0.5 tablets (20 mg total) by mouth every other day. (Patient taking differently: Take 20 mg by mouth daily. ) 30 tablet   . gabapentin (NEURONTIN) 300 MG capsule Take 300 mg by mouth 2 (two) times daily.     Marland Kitchen HYDROcodone-acetaminophen (NORCO/VICODIN) 5-325 MG per tablet Take 1 tablet by mouth every 6 (six) hours as needed for moderate pain. 30 tablet  0  . insulin glargine (LANTUS) 100 UNIT/ML injection Inject 38 Units into the skin at bedtime.     . isosorbide mononitrate (IMDUR) 30 MG 24 hr tablet Take 1 tablet (30 mg total) by mouth daily. 30 tablet 5  . levothyroxine (SYNTHROID, LEVOTHROID) 25 MCG tablet Take 25 mcg by mouth daily.      Marland Kitchen lisinopril (PRINIVIL,ZESTRIL) 5 MG tablet Take 5 mg by mouth daily.    . metoprolol tartrate (LOPRESSOR) 25 MG tablet Take 25 mg by mouth 2 (two) times daily.      . nitroGLYCERIN (NITROSTAT) 0.4 MG SL tablet Place 1 tablet (0.4 mg total) under the tongue every 5 (five) minutes as needed. Chest pain (Patient taking differently: Place 0.4 mg under the tongue every 5 (five) minutes as needed for chest pain. ) 25 tablet 3  . oxybutynin (DITROPAN-XL) 10 MG 24 hr tablet Take 10 mg by mouth every other day.    . polyvinyl alcohol (LIQUIFILM TEARS) 1.4 % ophthalmic solution Place 2 drops into the left eye 3 (three) times daily. 15 mL 0  . simvastatin (ZOCOR) 20 MG tablet Take 20 mg by mouth every evening.     No current facility-administered medications for this encounter.    REVIEW OF SYSTEMS:  Notable for that above.   PHYSICAL EXAM:  weight is 173 lb 14.4 oz (78.881 kg). Her oral temperature is 98.2 F (36.8 C). Her blood pressure is 136/52 and her pulse is 63. Her respiration is 20.   General: Alert and oriented, in no acute distress HEENT: Head is normocephalic. Left eyelid partially sewn shut.  Edentulous. Erythematous eyelid. Oropharynx is clear but sputum is thick. No thrush. Surgical scar over left face /neck healing well.  Neck:  no palpable cervical or supraclavicular lymphadenopathy. Heart: Regular in rate and rhythm  Chest: Clear to auscultation bilaterally, with no rhonchi, wheezes, or rales. Abdomen: Soft, nontender, nondistended, with no rigidity or guarding. Extremities:+ LE edema. Lymphatics: see Neck Exam Skin:dry skin, flaking over legs.  Sun damage diffusely. Musculoskeletal: using a  walker Neurologic: left facial droop. Hard of hearing Speech is fluent. Psychiatric: Judgment and insight are intact.    ECOG = 3  0 - Asymptomatic (Fully active, able to carry on all predisease activities without restriction)  1 - Symptomatic but completely ambulatory (Restricted in physically strenuous activity but ambulatory and able to carry out work of a light or sedentary nature. For example, light housework, office work)  2 - Symptomatic, <50% in bed during the day (Ambulatory and capable of all self care but unable to carry out any work activities. Up and about more than 50% of waking hours)  3 - Symptomatic, >50% in bed, but not bedbound (Capable of only limited self-care, confined to bed or chair 50% or more of waking hours)  4 - Bedbound (Completely disabled. Cannot carry on  any self-care. Totally confined to bed or chair)  5 - Death   Eustace Pen MM, Creech RH, Tormey DC, et al. [phone]). "Toxicity and response criteria of the [facility]". [place] Oncol. 5 (6): 649-55   LABORATORY DATA:  Lab Results  Component Value Date   WBC 12.7* 03/13/2014   HGB 14.1 03/13/2014   HCT 43.0 03/13/2014   MCV 93.3 03/13/2014   PLT 326 03/13/2014   CMP     Component Value Date/Time   NA 137 03/13/2014 1329   K 5.0 03/13/2014 1329   CL 100 03/13/2014 1329   CO2 21 03/13/2014 1329   GLUCOSE 323* 03/13/2014 1329   BUN 43* 03/13/2014 1329   CREATININE 1.72* 03/13/2014 1329   CALCIUM 10.2 03/13/2014 1329   PROT 7.0 02/15/2012 1539   ALBUMIN 3.6 02/15/2012 1539   AST 19 02/15/2012 1539   ALT 16 02/15/2012 1539   ALKPHOS 119* 02/15/2012 1539   BILITOT 1.1 02/15/2012 1539   GFRNONAA 26* 03/13/2014 1329   GFRAA 30* 03/13/2014 1329      Lab Results  Component Value Date   TSH 2.420 03/22/2011      RADIOGRAPHY: see above    IMPRESSION/PLAN: Today, I talked to the patient and her close friends about the findings and work-up thus far. We discussed the  patient's diagnosis of metastatic skin cancer to the parotid and left neck and general treatment for this, highlighting the role of radiotherapy in the management. We discussed the available radiation techniques, and focused on the details of logistics and delivery. She understands she is at very high risk of recurrence and has a serious disease.  I explained that radiotherapy to the surgical bed/left neck and base of skull is standard to prevent recurrences.  However, I am not sure it would be realistic for her to tolerate a standard 6.5 week course.  An alterative we discussed was a 2.5 week course of 6 Gy, twice weekly, to 30Gy total. This is extrapolating from [place] and is non standard for her disease but more realistic for her to sustain and likely to decrease her chance of recurrence.   We discussed the risks, benefits, and side effects of radiotherapy. Side effects may include but not necessarily be limited to: skin irritation, fatigue, taste changes, mouth / throat soreness, and less common internal organ permanent injuries.  No guarantees of treatment were given. A consent form was signed and placed in the patient's medical record.  The patient was encouraged to ask questions that I answered to the best of my ability.   We will proceed with CT staging first, to rule out distant metastases.  We will then proceed with simulation if the scans are reassuring.    __________________________________________   Eppie Gibson, MD

## 2014-04-09 NOTE — Progress Notes (Signed)
81 year old female diagnosed with cancer of left parotid gland s/p neck dissection and total parotidectomy.  PMH includes CAD, HTN, TIA, pacemaker, IDDM, CHF, HLD, MI, osteopenia, COPD, and stroke.  Medications include plavix, lasix, lantus, synthroid, lopressor, and zocor.  Labs include 12/15: CBG: 166.  12/10 BUN 43 and Creatinine 1.72.  Height:  62 inches. Weight: 173.9 pounds. UBW: 189 pounds. BMI: 31.8  Patient here with close friends.  She is HOH. Has not decided on treatment yet.  Is still waiting to talk with MD. Patient described as a very picky eater.  She will only eat and drink what she wants. Currently living at a temporary rehab but is soon to be discharged. Denies current nutrition problems. Patient has an 8% weight loss from UBW.  I am unable to determine patient's actual intake other than a few favorite foods.  Nutrition Diagnosis:  Predicted suboptimal energy intake related to diagnosis of parotid gland cancer and associated treatments as evidenced by a condition for which research shows an increased incidence of inadequate oral intake.  Intervention:  Educated patient on importance of adequate calories and protein during treatment.  Reviewed appropriate snacks with patient and friends. Provided fact sheet on increasing calories and protein. Stressed importance of adequate hydration during treatment. Recommend patient try Glucerna as a snack between meals. Teach back method used.  Contact information was given.  Monitoring, evaluation, goals:  Patient will tolerate adequate calories and protein to minimize weight loss and dehydration.  Next Visit:  To be scheduled with treatment weekly.  **Disclaimer: This note was dictated with voice recognition software. Similar sounding words can inadvertently be transcribed and this note may contain transcription errors which may not have been corrected upon publication of note.**

## 2014-04-09 NOTE — Telephone Encounter (Signed)
Pt was recently diagnosed w/ CA of L parotid. Pt has a MDT dual ppm on L side---pt is not pacer dependant. Radiation to start as early as the end of next week. Radiation form to be faxed to [facility] from Dr.Squire's ofc to the attn of [employer]. Dr.Squire's ofc is aware that [facility] will not be in the ofc until Friday. Will forward to [facility] and [place].

## 2014-04-09 NOTE — Telephone Encounter (Signed)
Please let her know that I do not anticipate that the radiation will cause a pacemaker problem

## 2014-04-09 NOTE — Telephone Encounter (Signed)
Please call asap concerning pt,[name].

## 2014-04-09 NOTE — Progress Notes (Signed)
Head & Neck Multidisciplinary Clinic Clinical Social Work  Clinical Social Work met with patient and radiation oncologist at head & neck multidisciplinary clinic to offer support and assess for psychosocial needs.  Radiation oncologist reviewed patient's diagnosis and recommended treatment plan with patient and patient's two friends.  [name] is not married and has minimal family.  She is currently residing at [facility] for rehab therapy.  CSW discussed options for patient with patient's friends including staying at [facility], transitioning to [place], or possible home care resources if patient chooses to reside home alone. Patient's distress screen was completed by patient's friends- patient hard of hearing and difficulty reading.  CSW discussed distress screen with patient/friends.  CSW provided brief emotional support to patient.  ONCBCN DISTRESS SCREENING 04/09/2014  Screening Type Initial Screening  Distress experienced in past week (1-10) 5  Family Problem type (No Data)  Emotional problem type Nervousness/Anxiety;Adjusting to illness;Isolation/feeling alone;Feeling hopeless;Adjusting to appearance changes  Information Concerns Type Lack of info about maintaining fitness  Physical Problem type Bathing/dressing;Getting around;Mouth sores/swallowing  Other seen by L Mullis, SW in head & neck MDC, note: Meals On Wheels, additional PCS services    Clinical Social Work briefly discussed Clinical Social Work role and [employer] support programs/services.  Clinical Social Work encouraged patient to call with any additional questions or concerns.   Polo Riley, MSW, LCSW, OSW-C Clinical Social Worker [facility] [phone]

## 2014-04-09 NOTE — Therapy (Signed)
[place], [place], [address] Phone: [phone]   Fax:  [phone]  Physical Therapy Evaluation  Patient Details  Name: [name] MRN: [id number] Date of Birth: 1928/12/06 Referring Provider:  Eppie Gibson, MD  Encounter Date: 04/09/2014    Past Medical History  Diagnosis Date  . CHF (congestive heart failure)   . Coronary artery disease     multiple PCI; echo 09/15/10-EF45-50%, aortic valve sclerosis without stenosis; myoview 03/23/11-no reversible defects, EF66%  . Hypertension   . Pacemaker 09/2010    sinus node dysfunction, sinus bradycardia  . Hyperlipidemia   . Hypothyroidism   . Carotid bruit 10/25/2002    carotid doppler- normal  . Myocardial infarction   . Insulin dependent diabetes mellitus     fasting cbg 160s  . Chronic kidney disease     stage IV renal disease  . 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 Osteopenia   . COPD (chronic obstructive pulmonary disease)   . Stroke about 20 years ago     1998  . Skin cancer     ermoved from left side of face    Past Surgical History  Procedure Laterality Date  . Insert / replace / remove pacemaker  09/21/10    medtronic Adapta   . Coronary angioplasty with stent placement  09/13/10    preserved LV function, PTCA/stenting of the RCA with insertion of two stents, 2.75x28mm stent proximal RCA and 2.25x78mm at the distal RCA, post stent dilatation up to 2.1mm  . Cardiac catheterization  05/28/10    EF 65%, medical therapy  . Coronary angioplasty with stent placement  06/04/09    PCI, stenting of the mid and prox RCA with Promus DES  . Cardiac catheterization  06/03/09    PCI, stenting of the major oblique marginal arteries off the circ  . Coronary angioplasty with stent placement  10/16/2002    stenting of mid RCA with 2.75x59mm Cypher stent deployed at 16 atmospheres; EF 60  . Coronary angioplasty with stent placement  11/29/2001   2.75x7mm cutting balloon angioplasty of the in stent restenosis of RCA, bracytherapy of the in stend restenotic segment of mid RCA  . Cardiac catheterization  11/28/2001    high grade isten restenosis of RCA stent placed 03/02/01, proceed with cutting balloon 8/28  . Coronary angioplasty with stent placement  03/02/2001    ptca and stenting of RCA (2.75x44mm Ceta) after predilatation with a 2.5x60mm CrossSail and OpenSail balloon; EF 60%  . Tumor removal      on side of neck  . Parotidectomy Left 03/14/2014    Procedure: LEFT PAROTIDECTOMY WITH FACIAL NERVE DISSECTION ;  Surgeon: Rozetta Nunnery, MD;  Location: [place];  Service: ENT;  Laterality: Left;  Total parotidectomy with facial nerve dissection, limited LEFT nerve dissection, LEFT tarsorrhaphy  . Radical neck dissection Left 03/14/2014    Procedure: LEFT NECK DISSECTION;  Surgeon: Rozetta Nunnery, MD;  Location: [place];  Service: ENT;  Laterality: Left;    There were no vitals taken for this visit.  Visit Diagnosis:  Squamous cell carcinoma   I spoke with this patient and the two friends who were with her for Head & Neck Multidisciplinary Clinic today.  As she is receiving therapy at [facility], where she is a temporary resident, I did not evaluate her today.  I did offer handouts that included information on neck ROM, posture, walking, and lymphedema, but suggested that she  have the therapists at her facility evaluate and treat her needs while she is there.                                    Problem List Patient Active Problem List   Diagnosis Date Noted  . Status post surgery 03/14/2014  . DM hyperosmolar coma,type 2, not at goal 02/15/2012  . CAD (coronary artery disease) 02/15/2012  . Chest pain negative MI medication adjustments. 03/22/2011  . CHF (congestive heart failure) chronic and stable   . Coronary artery disease   . Hypertension   . Hypothyroidism   . Hyperlipidemia   . TIA  (transient ischemic attack)   . Pacemaker   . Insulin dependent diabetes mellitus     SALISBURY,DONNA 04/09/2014, 4:51 PM  [place], [place], [address] Phone: [phone]   Fax:  [phone]  Serafina Royals, [address]

## 2014-04-10 ENCOUNTER — Telehealth: Payer: Self-pay | Admitting: *Deleted

## 2014-04-10 NOTE — Progress Notes (Signed)
Met with patient and her caregivers during initial consult with Dr. Isidore Moos. 1. Introduced myself as their Navigator, explained my role as a member of the Care Team, provided contact information, encouraged them to contact me with questions/concerns as treatments/procedures begin. 2. Provided New Patient Information packet:  Contact information for physician and navigator  Advance Directive information ([place] blue pamphlet)  Fall Prevention Patient Safety Plan  [facility] campus map with highlight of [facility]  Appointment Guideline 3. Provided introductory explanation of radiation treatment including SIM planning and fitting of head mask, showed them example of mask.   They verbalized understanding of information provided.    Gayleen Orem, RN, BSN, [place] at [place] [phone]

## 2014-04-10 NOTE — Telephone Encounter (Signed)
Signed order for device info prior to radiation therapy.

## 2014-04-10 NOTE — Telephone Encounter (Signed)
LM with info from Dr. Loletha Grayer - radiation should not cause any problems with her pacemaker.

## 2014-04-11 ENCOUNTER — Ambulatory Visit (HOSPITAL_COMMUNITY)
Admission: RE | Admit: 2014-04-11 | Discharge: 2014-04-11 | Disposition: A | Discharge status: Home / Self Care | Payer: Medicare Other | Source: Ambulatory Visit | Attending: Radiation Oncology | Admitting: Radiation Oncology

## 2014-04-11 DIAGNOSIS — M2548 Effusion, other site: Secondary | ICD-10-CM | POA: Diagnosis not present

## 2014-04-11 DIAGNOSIS — M5030 Other cervical disc degeneration, unspecified cervical region: Secondary | ICD-10-CM | POA: Insufficient documentation

## 2014-04-11 DIAGNOSIS — I700 Atherosclerosis of aorta: Secondary | ICD-10-CM | POA: Insufficient documentation

## 2014-04-11 DIAGNOSIS — C760 Malignant neoplasm of head, face and neck: Secondary | ICD-10-CM | POA: Diagnosis present

## 2014-04-11 DIAGNOSIS — I2510 Atherosclerotic heart disease of native coronary artery without angina pectoris: Secondary | ICD-10-CM | POA: Insufficient documentation

## 2014-04-11 DIAGNOSIS — N289 Disorder of kidney and ureter, unspecified: Secondary | ICD-10-CM | POA: Insufficient documentation

## 2014-04-11 DIAGNOSIS — K8020 Calculus of gallbladder without cholecystitis without obstruction: Secondary | ICD-10-CM | POA: Insufficient documentation

## 2014-04-11 DIAGNOSIS — M47892 Other spondylosis, cervical region: Secondary | ICD-10-CM | POA: Diagnosis not present

## 2014-04-11 DIAGNOSIS — C44320 Squamous cell carcinoma of skin of unspecified parts of face: Secondary | ICD-10-CM

## 2014-04-11 DIAGNOSIS — E049 Nontoxic goiter, unspecified: Secondary | ICD-10-CM | POA: Insufficient documentation

## 2014-04-11 DIAGNOSIS — C7989 Secondary malignant neoplasm of other specified sites: Secondary | ICD-10-CM | POA: Diagnosis not present

## 2014-04-11 DIAGNOSIS — J9811 Atelectasis: Secondary | ICD-10-CM | POA: Diagnosis not present

## 2014-04-11 DIAGNOSIS — Z950 Presence of cardiac pacemaker: Secondary | ICD-10-CM | POA: Diagnosis not present

## 2014-04-11 DIAGNOSIS — E279 Disorder of adrenal gland, unspecified: Secondary | ICD-10-CM | POA: Diagnosis not present

## 2014-04-14 ENCOUNTER — Telehealth: Payer: Self-pay | Admitting: *Deleted

## 2014-04-14 NOTE — Telephone Encounter (Signed)
Per Dr. Isidore Moos, notified caregiver Colletta Maryland of CT results and a planning CT is being scheduled for 04/21/14.  She verbalized understanding.  Gayleen Orem, RN, BSN, [place] at [place] [phone]

## 2014-04-15 ENCOUNTER — Telehealth: Payer: Self-pay | Admitting: *Deleted

## 2014-04-15 NOTE — Telephone Encounter (Signed)
Confirmed with [name] an 0800 CT Sim appt on 04/21/14 for patient.  Gayleen Orem, RN, BSN, [place] at [place] [phone]

## 2014-04-16 ENCOUNTER — Telehealth: Payer: Self-pay | Admitting: *Deleted

## 2014-04-16 NOTE — Telephone Encounter (Signed)
Spoke with Dannial Monarch, [employer] rep re: this patient will begin radiation possibly the week of 04/21/14 following her ct sim on 04/21/14. Per Dr Croitoru's completion of [place] pacemaker form, patient's pacemaker device to be tested following her radiation treatments. Patient's full name, DOB given to Tomi Bamberger; will call Tomi Bamberger with pt's radiation schedule when it is complete. Tomi Bamberger verbalized understanding.

## 2014-04-21 ENCOUNTER — Encounter: Payer: Self-pay | Admitting: *Deleted

## 2014-04-21 ENCOUNTER — Other Ambulatory Visit: Payer: Self-pay

## 2014-04-21 ENCOUNTER — Other Ambulatory Visit (HOSPITAL_COMMUNITY): Payer: Self-pay

## 2014-04-21 ENCOUNTER — Encounter (HOSPITAL_COMMUNITY): Payer: Self-pay | Admitting: Emergency Medicine

## 2014-04-21 ENCOUNTER — Emergency Department (HOSPITAL_COMMUNITY): Payer: Medicare Other

## 2014-04-21 ENCOUNTER — Inpatient Hospital Stay (HOSPITAL_COMMUNITY)
Admission: EM | Admit: 2014-04-21 | Discharge: 2014-04-23 | DRG: 312 | Disposition: A | Discharge status: Skilled Nursing Facility | Payer: Medicare Other | Attending: Family Medicine | Admitting: Family Medicine

## 2014-04-21 ENCOUNTER — Ambulatory Visit
Admission: RE | Admit: 2014-04-21 | Discharge: 2014-04-21 | Disposition: A | Discharge status: Home / Self Care | Payer: Medicare Other | Source: Ambulatory Visit | Attending: Radiation Oncology | Admitting: Radiation Oncology

## 2014-04-21 ENCOUNTER — Inpatient Hospital Stay (HOSPITAL_COMMUNITY): Payer: Medicare Other

## 2014-04-21 DIAGNOSIS — H9190 Unspecified hearing loss, unspecified ear: Secondary | ICD-10-CM | POA: Diagnosis present

## 2014-04-21 DIAGNOSIS — Z823 Family history of stroke: Secondary | ICD-10-CM | POA: Diagnosis not present

## 2014-04-21 DIAGNOSIS — R079 Chest pain, unspecified: Secondary | ICD-10-CM

## 2014-04-21 DIAGNOSIS — E11649 Type 2 diabetes mellitus with hypoglycemia without coma: Secondary | ICD-10-CM | POA: Diagnosis present

## 2014-04-21 DIAGNOSIS — Z833 Family history of diabetes mellitus: Secondary | ICD-10-CM

## 2014-04-21 DIAGNOSIS — C44310 Basal cell carcinoma of skin of unspecified parts of face: Secondary | ICD-10-CM | POA: Insufficient documentation

## 2014-04-21 DIAGNOSIS — Z510 Encounter for antineoplastic radiation therapy: Secondary | ICD-10-CM | POA: Insufficient documentation

## 2014-04-21 DIAGNOSIS — J449 Chronic obstructive pulmonary disease, unspecified: Secondary | ICD-10-CM | POA: Diagnosis present

## 2014-04-21 DIAGNOSIS — C4490 Unspecified malignant neoplasm of skin, unspecified: Secondary | ICD-10-CM | POA: Diagnosis present

## 2014-04-21 DIAGNOSIS — K649 Unspecified hemorrhoids: Secondary | ICD-10-CM | POA: Diagnosis present

## 2014-04-21 DIAGNOSIS — Z794 Long term (current) use of insulin: Secondary | ICD-10-CM | POA: Diagnosis not present

## 2014-04-21 DIAGNOSIS — I129 Hypertensive chronic kidney disease with stage 1 through stage 4 chronic kidney disease, or unspecified chronic kidney disease: Secondary | ICD-10-CM | POA: Diagnosis present

## 2014-04-21 DIAGNOSIS — Z955 Presence of coronary angioplasty implant and graft: Secondary | ICD-10-CM

## 2014-04-21 DIAGNOSIS — R55 Syncope and collapse: Secondary | ICD-10-CM | POA: Diagnosis present

## 2014-04-21 DIAGNOSIS — Z825 Family history of asthma and other chronic lower respiratory diseases: Secondary | ICD-10-CM | POA: Diagnosis not present

## 2014-04-21 DIAGNOSIS — R432 Parageusia: Secondary | ICD-10-CM | POA: Insufficient documentation

## 2014-04-21 DIAGNOSIS — C44329 Squamous cell carcinoma of skin of other parts of face: Secondary | ICD-10-CM | POA: Diagnosis present

## 2014-04-21 DIAGNOSIS — I509 Heart failure, unspecified: Secondary | ICD-10-CM | POA: Diagnosis present

## 2014-04-21 DIAGNOSIS — C44300 Unspecified malignant neoplasm of skin of unspecified part of face: Secondary | ICD-10-CM

## 2014-04-21 DIAGNOSIS — E039 Hypothyroidism, unspecified: Secondary | ICD-10-CM | POA: Diagnosis present

## 2014-04-21 DIAGNOSIS — Z87891 Personal history of nicotine dependence: Secondary | ICD-10-CM | POA: Diagnosis not present

## 2014-04-21 DIAGNOSIS — Z8673 Personal history of transient ischemic attack (TIA), and cerebral infarction without residual deficits: Secondary | ICD-10-CM

## 2014-04-21 DIAGNOSIS — Z79899 Other long term (current) drug therapy: Secondary | ICD-10-CM | POA: Diagnosis not present

## 2014-04-21 DIAGNOSIS — Z8249 Family history of ischemic heart disease and other diseases of the circulatory system: Secondary | ICD-10-CM | POA: Diagnosis not present

## 2014-04-21 DIAGNOSIS — Z79891 Long term (current) use of opiate analgesic: Secondary | ICD-10-CM

## 2014-04-21 DIAGNOSIS — Z7902 Long term (current) use of antithrombotics/antiplatelets: Secondary | ICD-10-CM

## 2014-04-21 DIAGNOSIS — C7989 Secondary malignant neoplasm of other specified sites: Secondary | ICD-10-CM | POA: Diagnosis present

## 2014-04-21 DIAGNOSIS — Z950 Presence of cardiac pacemaker: Secondary | ICD-10-CM | POA: Insufficient documentation

## 2014-04-21 DIAGNOSIS — I2510 Atherosclerotic heart disease of native coronary artery without angina pectoris: Secondary | ICD-10-CM | POA: Diagnosis present

## 2014-04-21 DIAGNOSIS — R680 Hypothermia, not associated with low environmental temperature: Secondary | ICD-10-CM | POA: Diagnosis present

## 2014-04-21 DIAGNOSIS — K625 Hemorrhage of anus and rectum: Secondary | ICD-10-CM | POA: Diagnosis present

## 2014-04-21 DIAGNOSIS — I252 Old myocardial infarction: Secondary | ICD-10-CM | POA: Diagnosis not present

## 2014-04-21 DIAGNOSIS — N179 Acute kidney failure, unspecified: Secondary | ICD-10-CM | POA: Insufficient documentation

## 2014-04-21 DIAGNOSIS — T68XXXA Hypothermia, initial encounter: Secondary | ICD-10-CM

## 2014-04-21 DIAGNOSIS — E785 Hyperlipidemia, unspecified: Secondary | ICD-10-CM | POA: Diagnosis present

## 2014-04-21 DIAGNOSIS — N184 Chronic kidney disease, stage 4 (severe): Secondary | ICD-10-CM | POA: Diagnosis present

## 2014-04-21 DIAGNOSIS — IMO0001 Reserved for inherently not codable concepts without codable children: Secondary | ICD-10-CM

## 2014-04-21 DIAGNOSIS — M7989 Other specified soft tissue disorders: Secondary | ICD-10-CM | POA: Diagnosis present

## 2014-04-21 DIAGNOSIS — H6690 Otitis media, unspecified, unspecified ear: Secondary | ICD-10-CM | POA: Diagnosis present

## 2014-04-21 DIAGNOSIS — E119 Type 2 diabetes mellitus without complications: Secondary | ICD-10-CM

## 2014-04-21 DIAGNOSIS — M8580 Other specified disorders of bone density and structure, unspecified site: Secondary | ICD-10-CM | POA: Diagnosis present

## 2014-04-21 DIAGNOSIS — Z792 Long term (current) use of antibiotics: Secondary | ICD-10-CM | POA: Diagnosis not present

## 2014-04-21 DIAGNOSIS — I5032 Chronic diastolic (congestive) heart failure: Secondary | ICD-10-CM

## 2014-04-21 LAB — BASIC METABOLIC PANEL
ANION GAP: 8 (ref 5–15)
Anion gap: 8 (ref 5–15)
BUN: 26 mg/dL — ABNORMAL HIGH (ref 6–23)
BUN: 24 mg/dL — ABNORMAL HIGH (ref 6–23)
CALCIUM: 9.1 mg/dL (ref 8.4–10.5)
CALCIUM: 8.7 mg/dL (ref 8.4–10.5)
CO2: 26 mmol/L (ref 19–32)
CO2: 25 mmol/L (ref 19–32)
CREATININE: 1.71 mg/dL — AB (ref 0.50–1.10)
CREATININE: 1.53 mg/dL — AB (ref 0.50–1.10)
Chloride: 107 mEq/L (ref 96–112)
Chloride: 112 mEq/L (ref 96–112)
GFR calc Af Amer: 31 mL/min — ABNORMAL LOW (ref >90)
GFR calc Af Amer: 35 mL/min — ABNORMAL LOW (ref >90)
GFR calc non Af Amer: 30 mL/min — ABNORMAL LOW (ref >90)
GFR, EST NON AFRICAN AMERICAN: 26 mL/min — AB (ref >90)
GLUCOSE: 143 mg/dL — AB (ref 70–99)
GLUCOSE: 136 mg/dL — AB (ref 70–99)
Potassium: 5.6 mmol/L — ABNORMAL HIGH (ref 3.5–5.1)
Potassium: 3.9 mmol/L (ref 3.5–5.1)
SODIUM: 141 mmol/L (ref 135–145)
Sodium: 145 mmol/L (ref 135–145)

## 2014-04-21 LAB — CBC WITH DIFFERENTIAL/PLATELET
Basophils Absolute: 0.1 10*3/uL (ref 0.0–0.1)
Basophils Relative: 1 % (ref 0–1)
EOS ABS: 0.4 10*3/uL (ref 0.0–0.7)
EOS PCT: 4 % (ref 0–5)
HEMATOCRIT: 34.8 % — AB (ref 36.0–46.0)
Hemoglobin: 11.2 g/dL — ABNORMAL LOW (ref 12.0–15.0)
LYMPHS ABS: 2.5 10*3/uL (ref 0.7–4.0)
Lymphocytes Relative: 23 % (ref 12–46)
MCH: 30.1 pg (ref 26.0–34.0)
MCHC: 32.2 g/dL (ref 30.0–36.0)
MCV: 93.5 fL (ref 78.0–100.0)
MONO ABS: 1.3 10*3/uL — AB (ref 0.1–1.0)
MONOS PCT: 12 % (ref 3–12)
NEUTROS PCT: 60 % (ref 43–77)
Neutro Abs: 6.5 10*3/uL (ref 1.7–7.7)
PLATELETS: 236 10*3/uL (ref 150–400)
RBC: 3.72 MIL/uL — ABNORMAL LOW (ref 3.87–5.11)
RDW: 14.6 % (ref 11.5–15.5)
WBC: 10.7 10*3/uL — AB (ref 4.0–10.5)

## 2014-04-21 LAB — URINALYSIS, ROUTINE W REFLEX MICROSCOPIC
Bilirubin Urine: NEGATIVE
Glucose, UA: NEGATIVE mg/dL
HGB URINE DIPSTICK: NEGATIVE
KETONES UR: NEGATIVE mg/dL
Leukocytes, UA: NEGATIVE
Nitrite: NEGATIVE
PH: 7.5 (ref 5.0–8.0)
Protein, ur: NEGATIVE mg/dL
Specific Gravity, Urine: 1.011 (ref 1.005–1.030)
UROBILINOGEN UA: 0.2 mg/dL (ref 0.0–1.0)

## 2014-04-21 LAB — TSH: TSH: 11.498 u[IU]/mL — AB (ref 0.350–4.500)

## 2014-04-21 LAB — TROPONIN I: Troponin I: <0.03 ng/mL (ref <0.031)

## 2014-04-21 LAB — T4: T4, Total: 7.8 ug/dL (ref 4.5–12.0)

## 2014-04-21 LAB — I-STAT CG4 LACTIC ACID, ED: Lactic Acid, Venous: 1.19 mmol/L (ref 0.5–2.2)

## 2014-04-21 MED ORDER — OXYBUTYNIN CHLORIDE ER 10 MG PO TB24
10.0000 mg | ORAL_TABLET | ORAL | Status: DC
  Administered 2014-04-23: 10 mg via ORAL
  Filled 2014-04-21: qty 1

## 2014-04-21 MED ORDER — CALCITRIOL 0.25 MCG PO CAPS
0.2500 ug | ORAL_CAPSULE | Freq: Every day | ORAL | Status: DC
  Administered 2014-04-22 – 2014-04-23 (×2): 0.25 ug via ORAL
  Filled 2014-04-21 (×2): qty 1

## 2014-04-21 MED ORDER — ONDANSETRON HCL 4 MG/2ML IJ SOLN: 4.0000 mg | Freq: Four times a day (QID) | INTRAMUSCULAR | Status: DC | PRN

## 2014-04-21 MED ORDER — INSULIN ASPART 100 UNIT/ML ~~LOC~~ SOLN
0.0000 [IU] | Freq: Three times a day (TID) | SUBCUTANEOUS | Status: DC
Start: 2014-04-22 — End: 2014-04-23
  Administered 2014-04-22 – 2014-04-23 (×3): 1 [IU] via SUBCUTANEOUS

## 2014-04-21 MED ORDER — SODIUM CHLORIDE 0.9 % IJ SOLN
3.0000 mL | Freq: Two times a day (BID) | INTRAMUSCULAR | Status: DC
  Administered 2014-04-22 – 2014-04-23 (×2): 3 mL via INTRAVENOUS

## 2014-04-21 MED ORDER — ACETAMINOPHEN 325 MG PO TABS: 650.0000 mg | ORAL_TABLET | Freq: Four times a day (QID) | ORAL | Status: DC | PRN

## 2014-04-21 MED ORDER — METOPROLOL TARTRATE 25 MG PO TABS
25.0000 mg | ORAL_TABLET | Freq: Two times a day (BID) | ORAL | Status: DC
  Administered 2014-04-21 – 2014-04-23 (×4): 25 mg via ORAL
  Filled 2014-04-21 (×4): qty 1

## 2014-04-21 MED ORDER — SIMVASTATIN 20 MG PO TABS
20.0000 mg | ORAL_TABLET | Freq: Every day | ORAL | Status: DC
  Administered 2014-04-21 – 2014-04-22 (×2): 20 mg via ORAL
  Filled 2014-04-21 (×2): qty 1

## 2014-04-21 MED ORDER — ONDANSETRON HCL 4 MG PO TABS: 4.0000 mg | ORAL_TABLET | Freq: Four times a day (QID) | ORAL | Status: DC | PRN

## 2014-04-21 MED ORDER — ASPIRIN EC 81 MG PO TBEC
81.0000 mg | DELAYED_RELEASE_TABLET | Freq: Every day | ORAL | Status: DC
  Administered 2014-04-22 – 2014-04-23 (×2): 81 mg via ORAL
  Filled 2014-04-21 (×2): qty 1

## 2014-04-21 MED ORDER — SODIUM CHLORIDE 0.9 % IV SOLN
INTRAVENOUS | Status: DC
  Administered 2014-04-21: 125 mL/h via INTRAVENOUS

## 2014-04-21 MED ORDER — LEVOTHYROXINE SODIUM 50 MCG PO TABS
50.0000 ug | ORAL_TABLET | Freq: Every day | ORAL | Status: DC
  Administered 2014-04-22 – 2014-04-23 (×2): 50 ug via ORAL
  Filled 2014-04-21 (×2): qty 1

## 2014-04-21 MED ORDER — HEPARIN SODIUM (PORCINE) 5000 UNIT/ML IJ SOLN: 5000.0000 [IU] | Freq: Three times a day (TID) | INTRAMUSCULAR | Status: DC

## 2014-04-21 MED ORDER — INSULIN GLARGINE 100 UNIT/ML ~~LOC~~ SOLN
10.0000 [IU] | Freq: Every day | SUBCUTANEOUS | Status: DC
  Administered 2014-04-21 – 2014-04-22 (×2): 10 [IU] via SUBCUTANEOUS
  Filled 2014-04-21 (×3): qty 0.1

## 2014-04-21 MED ORDER — ISOSORBIDE MONONITRATE ER 30 MG PO TB24
30.0000 mg | ORAL_TABLET | Freq: Every day | ORAL | Status: DC
  Administered 2014-04-22 – 2014-04-23 (×2): 30 mg via ORAL
  Filled 2014-04-21 (×2): qty 1

## 2014-04-21 MED ORDER — ALBUTEROL SULFATE (2.5 MG/3ML) 0.083% IN NEBU
2.5000 mg | INHALATION_SOLUTION | RESPIRATORY_TRACT | Status: DC | PRN
Start: 2014-04-21 — End: 2014-04-23

## 2014-04-21 MED ORDER — SODIUM CHLORIDE 0.9 % IV SOLN
INTRAVENOUS | Status: AC
  Administered 2014-04-21 – 2014-04-22 (×2): via INTRAVENOUS

## 2014-04-21 MED ORDER — GABAPENTIN 300 MG PO CAPS
300.0000 mg | ORAL_CAPSULE | Freq: Two times a day (BID) | ORAL | Status: DC
  Administered 2014-04-21 – 2014-04-23 (×4): 300 mg via ORAL
  Filled 2014-04-21 (×4): qty 1

## 2014-04-21 MED ORDER — POLYVINYL ALCOHOL 1.4 % OP SOLN
2.0000 [drp] | Freq: Three times a day (TID) | OPHTHALMIC | Status: DC
  Administered 2014-04-21 – 2014-04-23 (×5): 2 [drp] via OPHTHALMIC
  Filled 2014-04-21: qty 15

## 2014-04-21 MED ORDER — ACETAMINOPHEN 650 MG RE SUPP: 650.0000 mg | Freq: Four times a day (QID) | RECTAL | Status: DC | PRN

## 2014-04-21 NOTE — ED Notes (Addendum)
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 Pt was at the cancer center earlier to today to discuss her treatment, pt to start radiation soon.

## 2014-04-21 NOTE — ED Notes (Signed)
Bed: WA21 Expected date:  Expected time:  Means of arrival:  Comments: Emsi 81 yo, UTI

## 2014-04-21 NOTE — Addendum Note (Signed)
Encounter addended by: Eppie Gibson, MD on: 04/21/2014  2:56 PM<BR>     Documentation filed: Notes Section

## 2014-04-21 NOTE — ED Notes (Signed)
Patient transported to CT 

## 2014-04-21 NOTE — Progress Notes (Addendum)
Simulation, IMRT treatment planning, and Special treatment procedure note   Outpatient  Diagnosis:    ICD-9-CM ICD-10-CM   1. Skin cancer of face 173.31 C44.310      The patient was taken to the CT simulator and laid in the supine position on the table. An Aquaplast head and shoulder mask was custom fitted to the patient's anatomy. High-resolution CT axial imaging was obtained of the head and neck with contrast. I verified that the quality of the imaging is good for treatment planning. 1 Medically Necessary Treatment Device was fabricated and supervised by me: Aquaplast mask.   Treatment planning note I plan to treat the patient with helical Tomotherapy, IMRT. I plan to treat the patient's tumor bed (L parotid) and ipsilateral neck nodes. I plan to treat to a total dose of 30 Gray in 5  Fractions. Dose calculation was ordered from dosimetry.  Treatments will be given twice a week.    IMRT planning Note  IMRT is an important modality to deliver adequate dose to the patient's at risk tissues while sparing the patient's normal structures, including the: esophagus, parotid tissue, brain, mandible, brain stem, spinal cord, oral cavity, brachial plexus.  This justifies the use of IMRT in the patient's treatment.     I have ordered a diode for the first day of treatment, given her pacemaker. -----------------------------------  Eppie Gibson, MD

## 2014-04-21 NOTE — ED Notes (Addendum)
Md spoke with [name], [name] from [place]. RN reports that pt had 5 minute syncopal episode this morning and that is the reason pt was sent to ER.

## 2014-04-21 NOTE — Progress Notes (Signed)
To provide support and encouragement, care continuity and to assess for needs, met with patient and her friend [name] after CT SIM.   Patient tolerated procedure without difficulty.   [name] indicated that patient is still at [facility].   They understand I can be contacted with concerns/needs.  Gayleen Orem, RN, BSN, [place] at [place] [phone]

## 2014-04-21 NOTE — Progress Notes (Signed)
Small amount of blood noted in pt BM.  MD notified.

## 2014-04-21 NOTE — ED Notes (Signed)
Number of Kristine Garbe, RN involved with care at [facility] is [phone]

## 2014-04-21 NOTE — ED Notes (Addendum)
Per EMS, Pt from [facility], pt currently on antibiotics for an ear infection. Pt c/o pain with urination and per facility, pt is altered. Pt A&Ox4. Pt maintains that she does not want to be here. Ambulatory with walker. Pt has L sided deficits from previous stroke, no new symptoms reported.

## 2014-04-21 NOTE — ED Provider Notes (Signed)
CSN: [id number]     Arrival date & time 04/21/14  1200 History   First MD Initiated Contact with Patient 04/21/14 1211     Chief Complaint  Patient presents with  . Dysuria     (Consider location/radiation/quality/duration/timing/severity/associated sxs/prior Treatment) HPI Comments: Patient here with possible altered mental status from nursing home. Currently being treated for ear infection and complain of having some dysuria today. No reported vomiting or diarrhea. Patient was noted to be hypothermic here with a temperature of 93.8 rectally. She denies any cough or congestion. Symptoms have been persistent. No prior history of thyroid disorder noted. Patient's blood sugar was 210 per EMS. She denies any chest pain or abdominal pain. No reported history of vaginal bleeding. Denies any ear pain at this time  Patient is a 81 y.o. female presenting with dysuria. The history is provided by the patient.  Dysuria   Past Medical History  Diagnosis Date  . CHF (congestive heart failure)   . Coronary artery disease     multiple PCI; echo 09/15/10-EF45-50%, aortic valve sclerosis without stenosis; myoview 03/23/11-no reversible defects, EF66%  . Hypertension   . Pacemaker 09/2010    sinus node dysfunction, sinus bradycardia  . Hyperlipidemia   . Hypothyroidism   . Carotid bruit 10/25/2002    carotid doppler- normal  . Myocardial infarction   . Insulin dependent diabetes mellitus     fasting cbg 160s  . Chronic kidney disease     stage IV renal disease  . 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 Osteopenia   . COPD (chronic obstructive pulmonary disease)   . Stroke about 20 years ago     1998  . Skin cancer     ermoved from left side of face   Past Surgical History  Procedure Laterality Date  . Insert / replace / remove pacemaker  09/21/10    medtronic Adapta   . Coronary angioplasty with stent placement  09/13/10    preserved LV function, PTCA/stenting of the RCA with insertion of two  stents, 2.75x77mm stent proximal RCA and 2.25x34mm at the distal RCA, post stent dilatation up to 2.39mm  . Cardiac catheterization  05/28/10    EF 65%, medical therapy  . Coronary angioplasty with stent placement  06/04/09    PCI, stenting of the mid and prox RCA with Promus DES  . Cardiac catheterization  06/03/09    PCI, stenting of the major oblique marginal arteries off the circ  . Coronary angioplasty with stent placement  10/16/2002    stenting of mid RCA with 2.75x60mm Cypher stent deployed at 16 atmospheres; EF 60  . Coronary angioplasty with stent placement  11/29/2001    2.75x81mm cutting balloon angioplasty of the in stent restenosis of RCA, bracytherapy of the in stend restenotic segment of mid RCA  . Cardiac catheterization  11/28/2001    high grade isten restenosis of RCA stent placed 03/02/01, proceed with cutting balloon 8/28  . Coronary angioplasty with stent placement  03/02/2001    ptca and stenting of RCA (2.75x44mm Ceta) after predilatation with a 2.5x93mm CrossSail and OpenSail balloon; EF 60%  . Tumor removal      on side of neck  . Parotidectomy Left 03/14/2014    Procedure: LEFT PAROTIDECTOMY WITH FACIAL NERVE DISSECTION ;  Surgeon: Rozetta Nunnery, MD;  Location: [facility];  Service: ENT;  Laterality: Left;  Total parotidectomy with facial nerve dissection, limited LEFT nerve dissection, LEFT tarsorrhaphy  . Radical neck dissection  Left 03/14/2014    Procedure: LEFT NECK DISSECTION;  Surgeon: Rozetta Nunnery, MD;  Location[facility];  Service: ENT;  Laterality: Left;   Family History  Problem Relation Age of Onset  . Asthma Mother   . Stroke Mother   . Diabetes Father   . Heart failure Father    History  Substance Use Topics  . Smoking status: Former Smoker -- 0.50 packs/day    Quit date: 03/01/2012  . Smokeless tobacco: Not on file  . Alcohol Use: No   OB History    No data available     Review of Systems  Genitourinary: Positive for dysuria.  All other  systems reviewed and are negative.     Allergies  Review of patient's allergies indicates no known allergies.  Home Medications   Prior to Admission medications   Medication Sig Start Date End Date Taking? Authorizing Provider  azithromycin (ZITHROMAX) 250 MG tablet Take 250-500 mg by mouth daily. Takes 500mg  on day 1, then 250mg  for 4 days 04/16/14  Yes Historical Provider, MD  calcitRIOL (ROCALTROL) 0.25 MCG capsule Take 0.25 mcg by mouth daily.   Yes Historical Provider, MD  ciprofloxacin-dexamethasone (CIPRODEX) otic suspension Place 4-5 drops into the left ear 2 (two) times daily. For 7 days 04/16/14 04/22/14 Yes Historical Provider, MD  erythromycin ophthalmic ointment Place 1 application into the left eye every 4 (four) hours while awake.    Yes Historical Provider, MD  furosemide (LASIX) 40 MG tablet Take 0.5 tablets (20 mg total) by mouth every other day. Patient taking differently: Take 20 mg by mouth every other day.  02/17/12  Yes Nita Sells, MD  gabapentin (NEURONTIN) 300 MG capsule Take 300 mg by mouth 2 (two) times daily.    Yes Historical Provider, MD  HYDROcodone-acetaminophen (NORCO/VICODIN) 5-325 MG per tablet Take 1 tablet by mouth every 6 (six) hours as needed for moderate pain. 03/17/14  Yes Rozetta Nunnery, MD  insulin glargine (LANTUS) 100 UNIT/ML injection Inject 18 Units into the skin at bedtime.  02/17/12  Yes Nita Sells, MD  isosorbide mononitrate (IMDUR) 30 MG 24 hr tablet Take 1 tablet (30 mg total) by mouth daily. 12/06/13  Yes Lorretta Harp, MD  levothyroxine (SYNTHROID, LEVOTHROID) 25 MCG tablet Take 25 mcg by mouth daily.     Yes Historical Provider, MD  lisinopril (PRINIVIL,ZESTRIL) 5 MG tablet Take 5 mg by mouth daily.   Yes Historical Provider, MD  metoprolol tartrate (LOPRESSOR) 25 MG tablet Take 25 mg by mouth 2 (two) times daily.     Yes Historical Provider, MD  nitroGLYCERIN (NITROSTAT) 0.4 MG SL tablet Place 1 tablet (0.4 mg  total) under the tongue every 5 (five) minutes as needed. Chest pain Patient taking differently: Place 0.4 mg under the tongue every 5 (five) minutes as needed for chest pain.  06/18/13  Yes Mihai Croitoru, MD  oxybutynin (DITROPAN-XL) 10 MG 24 hr tablet Take 10 mg by mouth every other day.   Yes Historical Provider, MD  polyvinyl alcohol (LIQUIFILM TEARS) 1.4 % ophthalmic solution Place 2 drops into the left eye 3 (three) times daily. 03/17/14  Yes Rozetta Nunnery, MD  simvastatin (ZOCOR) 20 MG tablet Take 20 mg by mouth at bedtime.    Yes Historical Provider, MD  triamcinolone (KENALOG) 0.025 % cream Apply 1 application topically 2 (two) times daily. Apply to legs twice a day for 10 days 04/14/14  Yes Historical Provider, MD  clopidogrel (PLAVIX) 75 MG tablet Take  1 tablet (75 mg total) by mouth daily. Patient not taking: Reported on 04/21/2014 08/22/13   Mihai Croitoru, MD   BP 104/37 mmHg  Pulse 59  Temp(Src) 93.8 F (34.3 C) (Rectal)  SpO2 94% Physical Exam  Constitutional: She is oriented to person, place, and time. She appears well-developed and well-nourished.  Non-toxic appearance. No distress.  HENT:  Head: Normocephalic and atraumatic.  Eyes: Conjunctivae, EOM and lids are normal. Pupils are equal, round, and reactive to light.  Neck: Normal range of motion. Neck supple. No tracheal deviation present. No thyroid mass present.  Cardiovascular: Normal rate, regular rhythm and normal heart sounds.  Exam reveals no gallop.   No murmur heard. Pulmonary/Chest: Effort normal and breath sounds normal. No stridor. No respiratory distress. She has no decreased breath sounds. She has no wheezes. She has no rhonchi. She has no rales.  Abdominal: Soft. Normal appearance and bowel sounds are normal. She exhibits no distension. There is no tenderness. There is no rebound and no CVA tenderness.  Musculoskeletal: Normal range of motion. She exhibits no edema or tenderness.  Neurological: She is  alert and oriented to person, place, and time. GCS eye subscore is 4. GCS verbal subscore is 5. GCS motor subscore is 6.  Patient at baseline from prior stroke. Left-sided facial droop noted  Skin: Skin is warm and dry. No abrasion and no rash noted.  Psychiatric: Her speech is normal and behavior is normal. Her affect is blunt.  Nursing note and vitals reviewed.   ED Course  Procedures (including critical care time) Labs Review Labs Reviewed  URINE CULTURE  CULTURE, BLOOD (ROUTINE X 2)  CULTURE, BLOOD (ROUTINE X 2)  CBC WITH DIFFERENTIAL  BASIC METABOLIC PANEL  URINALYSIS, ROUTINE W REFLEX MICROSCOPIC  T4  TSH  I-STAT CG4 LACTIC ACID, ED    Imaging Review No results found.   EKG Interpretation None      Date: 04/21/2014  Rate: 66  Rhythm: atrial paced rythm  QRS Axis: normal  Intervals: normal  ST/T Wave abnormalities: nonspecific ST changes  Conduction Disutrbances:nonspecific intraventricular conduction delay  Narrative Interpretation:   Old EKG Reviewed: unchanged   MDM   Final diagnoses:  Chest pain    Spoke with nursing home facility and they gave more information is safe the patient had a 5 minute episode of being unresponsive without seizure activity. States that she was pale. Patient slowly became more arousable. Hypothermic here and started on warming blanket. Patient's EKG shows a paced rhythm. Will be admitted to the medicine service for further evaluation of her syncope as well as her hypothermia.   Leota Jacobsen, MD 04/21/14 [phone]

## 2014-04-21 NOTE — Progress Notes (Signed)
Writer was going to D/C foley per protocol, but noticed blood in urine bag. NP on call, Corder, notified. NP gave orders to keep foley in until am. Will continue to monitor pt closely. Carnella Gu[place] I

## 2014-04-21 NOTE — H&P (Addendum)
[facility] History and Physical  [name] ZOX:[id number] DOB: 11/02/1928 DOA: 04/21/2014   PCP: Mathews Argyle, MD  Specialists: Cardiologist is Dr. Sallyanne Kuster and Dr. Gwenlyn Found. Followed by Dr. Lucia Gaskins for her parotid surgery. Dr. Isidore Moos is her radiation oncologist.  Chief Complaint: Passed out  HPI: [name] is a 81 y.o. 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 She lives in a nursing facility and skipped her breakfast this morning due to this appointment. She was seen by the radiation oncologist for the simulation. She was delayed and did not eat her lunch either. And then she was at the facility when she apparently passed out. There was no mention of seizure type activity. Patient has a previous history of stroke, but that did not have any new focal deficits. She was also found to be hypothermic. After a few minutes patient was back to baseline. In the ED, she remained hypothermic. It is unclear if she was hypoglycemic at the scene. Blood glucose here is normal. Patient denies any chest pain or shortness of breath. No nausea, vomiting, no diarrhea recently. Denies any headaches. She doesn't remember the event. Overall, not a very good historian. She is also hard of hearing.   Home Medications: Prior to Admission medications   Medication Sig Start Date End Date Taking? Authorizing Provider  azithromycin (ZITHROMAX) 250 MG tablet Take 250-500 mg by mouth daily. Takes 530m on day 1, then 2536mfor 4 days 04/16/14  Yes Historical Provider, MD  calcitRIOL (ROCALTROL) 0.25 MCG capsule Take 0.25 mcg by mouth daily.   Yes Historical Provider, MD  ciprofloxacin-dexamethasone (CIPRODEX)  otic suspension Place 4-5 drops into the left ear 2 (two) times daily. For 7 days 04/16/14 04/22/14 Yes Historical Provider, MD  erythromycin ophthalmic ointment Place 1 application into the left eye every 4 (four) hours while awake.    Yes Historical Provider, MD  furosemide (LASIX) 40 MG tablet Take 0.5 tablets (20 mg total) by mouth every other day. Patient taking differently: Take 20 mg by mouth every other day.  02/17/12  Yes JaNita SellsMD  gabapentin (NEURONTIN) 300 MG capsule Take 300 mg by mouth 2 (two) times daily.    Yes Historical Provider, MD  HYDROcodone-acetaminophen (NORCO/VICODIN) 5-325 MG per tablet Take 1 tablet by mouth every 6 (six) hours as needed for moderate pain. 03/17/14  Yes ChRozetta NunneryMD  insulin glargine (LANTUS) 100 UNIT/ML injection Inject 18 Units into the skin at bedtime.  02/17/12  Yes JaNita SellsMD  isosorbide mononitrate (IMDUR) 30 MG 24 hr tablet Take 1 tablet (30 mg total) by mouth daily. 12/06/13  Yes JoLorretta HarpMD  levothyroxine (SYNTHROID, LEVOTHROID) 25 MCG tablet Take 25 mcg by mouth daily.     Yes Historical Provider, MD  lisinopril (PRINIVIL,ZESTRIL) 5 MG tablet Take 5 mg by mouth daily.   Yes Historical Provider, MD  metoprolol tartrate (LOPRESSOR) 25 MG tablet Take 25 mg by mouth 2 (two) times daily.     Yes Historical Provider, MD  nitroGLYCERIN (NITROSTAT) 0.4 MG SL tablet Place 1 tablet (0.4 mg total) under the tongue every 5 (five) minutes as needed. Chest pain Patient taking differently: Place 0.4 mg under the tongue every 5 (five) minutes as  needed for chest pain.  06/18/13  Yes Mihai Croitoru, MD  oxybutynin (DITROPAN-XL) 10 MG 24 hr tablet Take 10 mg by mouth every other day.   Yes Historical Provider, MD  polyvinyl alcohol (LIQUIFILM TEARS) 1.4 % ophthalmic solution Place 2 drops into the left eye 3 (three) times daily. 03/17/14  Yes Rozetta Nunnery, MD  simvastatin (ZOCOR) 20 MG tablet Take 20 mg by mouth at  bedtime.    Yes Historical Provider, MD  triamcinolone (KENALOG) 0.025 % cream Apply 1 application topically 2 (two) times daily. Apply to legs twice a day for 10 days 04/14/14  Yes Historical Provider, MD  clopidogrel (PLAVIX) 75 MG tablet Take 1 tablet (75 mg total) by mouth daily. Patient not taking: Reported on 04/21/2014 08/22/13   [name], MD    Allergies: No Known Allergies  Past Medical History: Past Medical History  Diagnosis Date  . CHF (congestive heart failure)   . Coronary artery disease     multiple PCI; echo 09/15/10-EF45-50%, aortic valve sclerosis without stenosis; myoview 03/23/11-no reversible defects, EF66%  . Hypertension   . Pacemaker 09/2010    sinus node dysfunction, sinus bradycardia  . Hyperlipidemia   . Hypothyroidism   . Carotid bruit 10/25/2002    carotid doppler- normal  . Myocardial infarction   . Insulin dependent diabetes mellitus     fasting cbg 160s  . Chronic kidney disease     stage IV renal disease  . 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 Osteopenia   . COPD (chronic obstructive pulmonary disease)   . Stroke about 20 years ago     1998  . Skin cancer     ermoved from left side of face    Past Surgical History  Procedure Laterality Date  . Insert / replace / remove pacemaker  09/21/10    medtronic Adapta   . Coronary angioplasty with stent placement  09/13/10    preserved LV function, PTCA/stenting of the RCA with insertion of two stents, 2.75x37m stent proximal RCA and 2.25x262mat the distal RCA, post stent dilatation up to 2.37m70m. Cardiac catheterization  05/28/10    EF 65%, medical therapy  . Coronary angioplasty with stent placement  06/04/09    PCI, stenting of the mid and prox RCA with Promus DES  . Cardiac catheterization  06/03/09    PCI, stenting of the major oblique marginal arteries off the circ  . Coronary angioplasty with stent placement  10/16/2002    stenting of mid RCA with 2.75x13m91mpher stent deployed at 16 atmospheres;  EF 60  . Coronary angioplasty with stent placement  11/29/2001    2.75x10mm57mting balloon angioplasty of the in stent restenosis of RCA, bracytherapy of the in stend restenotic segment of mid RCA  . Cardiac catheterization  11/28/2001    high grade isten restenosis of RCA stent placed 03/02/01, proceed with cutting balloon 8/28  . Coronary angioplasty with stent placement  03/02/2001    ptca and stenting of RCA (2.75x23mm 2m) after predilatation with a 2.5x20mm C337mSail and OpenSail balloon; EF 60%  . Tumor removal      on side of neck  . Parotidectomy Left 03/14/2014    Procedure: LEFT PAROTIDECTOMY WITH FACIAL NERVE DISSECTION ;  Surgeon: ChristoRozetta NunneryLocation: [facility] OR; [place]: ENT;  Laterality: Left;  Total parotidectomy with facial nerve dissection, limited LEFT nerve dissection, LEFT tarsorrhaphy  . Radical neck dissection Left 03/14/2014  Procedure: LEFT NECK DISSECTION;  Surgeon: Rozetta Nunnery, MD;  Location: [facility];  Service: ENT;  Laterality: Left;    Social History: She lives in a nursing facility. She denies smoking, alcohol use or illicit drug use. She uses a walker to ambulate.  Family History:  Family History  Problem Relation Age of Onset  . Asthma Mother   . Stroke Mother   . Diabetes Father   . Heart failure Father      Review of Systems - History obtained from the patient General ROS: positive for  - fatigue Psychological ROS: negative Ophthalmic ROS: negative ENT ROS: as in hpi Allergy and Immunology ROS: negative Hematological and Lymphatic ROS: negative Endocrine ROS: negative Respiratory ROS: no cough, shortness of breath, or wheezing Cardiovascular ROS: no chest pain or dyspnea on exertion Gastrointestinal ROS: no abdominal pain, change in bowel habits, or black or bloody stools Genito-Urinary ROS: no dysuria, trouble voiding, or hematuria Musculoskeletal ROS: negative Neurological ROS: no TIA or stroke symptoms Dermatological  ROS: negative  Physical Examination  Filed Vitals:   04/21/14 1330 04/21/14 1417 04/21/14 1530 04/21/14 1630  BP: 105/44 104/65 114/51 111/54  Pulse: 60 66 64 77  Temp:      TempSrc:      Resp: _0 SpO2: 93% 94% 95% 94%    BP 111/54 mmHg  Pulse 77  Temp(Src) 93.8 F (34.3 C) (Rectal)  Resp 17  SpO2 94%  General appearance: alert, cooperative, distracted, no distress and moderately obese Head: Asymmetric due to recent surgery. No obvious wound noted in the left face. Incision site looks clean. She has had a tarsorrhaphy of her left eye. Eyes: tarsorrhaphy of left eye. Throat: lips, mucosa, and tongue normal; teeth and gums normal Neck: no adenopathy, no carotid bruit, no JVD, supple, symmetrical, trachea midline and thyroid not enlarged, symmetric, no tenderness/mass/nodules Resp: clear to auscultation bilaterally Cardio: regular rate and rhythm, S1, S2 normal, no murmur, click, rub or gallop GI: soft, non-tender; bowel sounds normal; no masses,  no organomegaly Extremities: Right lower extremity is more swollen compared to the left. No pitting edema. Mild erythema is noted, but present in both lower extremities. Pulses: 2+ and symmetric Skin: Skin color, texture, turgor normal. No rashes or lesions Lymph nodes: Cervical, supraclavicular, and axillary nodes normal. Neurologic: Alert. Left facial paralysis noted due to recent resection of the facial nerve. No other focal deficits are noted.  Laboratory Data: Results for orders placed or performed during the hospital encounter of 04/21/14 (from the past 48 hour(s))  CBC with Differential     Status: Abnormal   Collection Time: 04/21/14 12:44 PM  Result Value Ref Range   WBC 10.7 (H) 4.0 - 10.5 K/uL   RBC 3.72 (L) 3.87 - 5.11 MIL/uL   Hemoglobin 11.2 (L) 12.0 - 15.0 g/dL   HCT 34.8 (L) 36.0 - 46.0 %   MCV 93.5 78.0 - 100.0 fL   MCH 30.1 26.0 - 34.0 pg   MCHC 32.2 30.0 - 36.0 g/dL   RDW 14.6 11.5 - 15.5 %    Platelets 236 150 - 400 K/uL   Neutrophils Relative % 60 43 - 77 %   Neutro Abs 6.5 1.7 - 7.7 K/uL   Lymphocytes Relative 23 12 - 46 %   Lymphs Abs 2.5 0.7 - 4.0 K/uL   Monocytes Relative 12 3 - 12 %   Monocytes Absolute 1.3 (H) 0.1 - 1.0 K/uL   Eosinophils Relative 4 0 -  5 %   Eosinophils Absolute 0.4 0.0 - 0.7 K/uL   Basophils Relative 1 0 - 1 %   Basophils Absolute 0.1 0.0 - 0.1 K/uL  Basic metabolic panel     Status: Abnormal   Collection Time: 04/21/14 12:44 PM  Result Value Ref Range   Sodium 141 135 - 145 mmol/L    Comment: Please note change in reference range.   Potassium 5.6 (H) 3.5 - 5.1 mmol/L    Comment: Please note change in reference range.   Chloride 107 96 - 112 mEq/L   CO2 26 19 - 32 mmol/L   Glucose, Bld 143 (H) 70 - 99 mg/dL   BUN 26 (H) 6 - 23 mg/dL   Creatinine, Ser 1.71 (H) 0.50 - 1.10 mg/dL   Calcium 9.1 8.4 - 10.5 mg/dL   GFR calc non Af Amer 26 (L) >90 mL/min   GFR calc Af Amer 31 (L) >90 mL/min    Comment: (NOTE) The eGFR has been calculated using the CKD EPI equation. This calculation has not been validated in all clinical situations. eGFR's persistently <90 mL/min signify possible Chronic Kidney Disease.    Anion gap 8 5 - 15  TSH     Status: Abnormal   Collection Time: 04/21/14 12:45 PM  Result Value Ref Range   TSH 11.498 (H) 0.350 - 4.500 uIU/mL    Comment: Performed at [facility] Lactic Acid, ED     Status: None   Collection Time: 04/21/14  1:00 PM  Result Value Ref Range   Lactic Acid, Venous 1.19 0.5 - 2.2 mmol/L  Urinalysis, Routine w reflex microscopic     Status: None   Collection Time: 04/21/14  1:18 PM  Result Value Ref Range   Color, Urine YELLOW YELLOW   APPearance CLEAR CLEAR   Specific Gravity, Urine 1.011 1.005 - 1.030   pH 7.5 5.0 - 8.0   Glucose, UA NEGATIVE NEGATIVE mg/dL   Hgb urine dipstick NEGATIVE NEGATIVE   Bilirubin Urine NEGATIVE NEGATIVE   Ketones, ur NEGATIVE NEGATIVE mg/dL   Protein, ur  NEGATIVE NEGATIVE mg/dL   Urobilinogen, UA 0.2 0.0 - 1.0 mg/dL   Nitrite NEGATIVE NEGATIVE   Leukocytes, UA NEGATIVE NEGATIVE    Comment: MICROSCOPIC NOT DONE ON URINES WITH NEGATIVE PROTEIN, BLOOD, LEUKOCYTES, NITRITE, OR GLUCOSE <1000 mg/dL.  Troponin I     Status: None   Collection Time: 04/21/14  2:41 PM  Result Value Ref Range   Troponin I <0.03 <0.031 ng/mL    Comment:        NO INDICATION OF MYOCARDIAL INJURY. Please note change in reference range.     Radiology Reports: Dg Chest 2 View  04/21/2014   CLINICAL DATA:  Patient on antibiotic therapy for ear infection. Altered mental status. History of congestive heart failure.  EXAM: CHEST  2 VIEW  COMPARISON:  03/22/2011  FINDINGS: Multi lead pacer apparatus overlies the left hemi thorax, leads are stable in position. Stable cardiac and mediastinal contours, mildly enlarged. No large consolidative pulmonary opacities. No pleural effusion or pneumothorax. Mid thoracic spine degenerative change.  IMPRESSION: No acute cardiopulmonary process.   Electronically Signed   By: Lovey Newcomer M.D.   On: 04/21/2014 14:09    Electrocardiogram: Atrial paced rhythm.  Problem List  Principal Problem:   Syncope Active Problems:   CHF (congestive heart failure) chronic and stable   Coronary artery disease   Hypothyroidism   Pacemaker   Insulin dependent diabetes  mellitus   CAD (coronary artery disease)   Skin cancer of face   Hypothermia   Assessment: This is a 81 year old Caucasian female with a past medical history as stated earlier, who presents after a syncopal episode. She has a history of squamous cell skin cancer with metastases to the left parotid gland. She underwent left parotidectomy in December. She is scheduled to start radiation treatment this Wednesday. Reason for her syncope and hypothermia is not clear. She skipped her breakfast this morning as well as lunch. Hypothermia would be explained by hypoglycemia. It's unclear if she  was hypoglycemic at the scene or not. However, glucose level at the scene is not known. There is no obvious infection. Temperature has responded to warming blankets.  Plan: #1 syncope: We will have her pacemaker interrogated. Monitor her on telemetry. Will get a CT head, considering squamous cell cancer of head and neck. No recent brain imaging is available. We will get orthostatics. Echocardiogram will be obtained. Troponins will be trended.  #2 Hypothermia: No clear reason for the same. TSH is noted to be elevated. We will check a free T4 and at the same time increase the dose of her Synthroid. Temperature has improved with warming blankets, which will be continued for now. Blood cultures have been obtained. There is no real foci of infection. Hold off on antibiotics for now. Hypothermia was likely metabolic in etiology.  #3 history of coronary artery disease: Appears to be stable. Previous notes suggest that she was on aspirin and Plavix. For some unclear reason Plavix was recently discontinued. Continue aspirin for now. Investigate the reason for discontinuation of Plavix. Check troponin. She had a stress test in December 2015, prior to her recent surgery, which was low risk. She had normal wall motion and normal LV function.  #4 history of hypothyroidism: TSH is elevated. Check free T4. Increase dose of her Synthroid.  #5 Recent squamous cell cancer involving left parotid gland as well as skin: Status post left parotidectomy in December 2015. Radiation to begin on Wednesday. Management per oncology as outpatient.  #6 history of insulin-dependent diabetes mellitus: Continue with her long-acting insulin at a lower dose. Sliding scale coverage will be provided. Check HbA1c in the morning.  #7 Right lower extremity swelling greater than left: Order venous Dopplers. She was unable to tell me this is chronic or not.  #8 Possible mild acute on Chronic kidney disease with hyperkalemia: Creatinine  appears to be close to baseline. Continue gentle hydration. Repeat Bmet in the evening to determine if there has been improvement in her potassium level.  ADDENDUM Called by RN that patient passed a small amount of blood per rectum. No change in VS. Check CBC in AM. Will stop Heparin. SCD's once venous doppler study is completed. Continue to monitor closely.  DVT Prophylaxis: Heparin. **SEE ADDENDUM ABOVE** Code Status: Full code Family Communication: Discussed with the patient  Disposition Plan: Observe to telemetry. PT/OT to see.   Further management decisions will depend on results of further testing and patient's response to treatment.   [facility]  [facility] Pager [phone]  If 7PM-7AM, please contact night-coverage [email or web address] Password [email or web address]  04/21/2014, 5:03 PM

## 2014-04-21 NOTE — Progress Notes (Signed)
Visited patient in [facility] ED to provide support, she was sleeping.  Gayleen Orem, RN, BSN, [facility] [place] [phone]

## 2014-04-22 ENCOUNTER — Telehealth: Payer: Self-pay | Admitting: *Deleted

## 2014-04-22 ENCOUNTER — Encounter: Payer: Self-pay | Admitting: *Deleted

## 2014-04-22 DIAGNOSIS — M7989 Other specified soft tissue disorders: Secondary | ICD-10-CM

## 2014-04-22 DIAGNOSIS — K625 Hemorrhage of anus and rectum: Secondary | ICD-10-CM

## 2014-04-22 DIAGNOSIS — I059 Rheumatic mitral valve disease, unspecified: Secondary | ICD-10-CM

## 2014-04-22 LAB — URINALYSIS, ROUTINE W REFLEX MICROSCOPIC
Bilirubin Urine: NEGATIVE
Glucose, UA: NEGATIVE mg/dL
Ketones, ur: NEGATIVE mg/dL
Nitrite: NEGATIVE
Protein, ur: NEGATIVE mg/dL
SPECIFIC GRAVITY, URINE: 1.015 (ref 1.005–1.030)
Urobilinogen, UA: 0.2 mg/dL (ref 0.0–1.0)
pH: 7.5 (ref 5.0–8.0)

## 2014-04-22 LAB — COMPREHENSIVE METABOLIC PANEL
ALT: 10 U/L (ref 0–35)
AST: 13 U/L (ref 0–37)
Albumin: 2.9 g/dL — ABNORMAL LOW (ref 3.5–5.2)
Alkaline Phosphatase: 70 U/L (ref 39–117)
Anion gap: 7 (ref 5–15)
BILIRUBIN TOTAL: 0.8 mg/dL (ref 0.3–1.2)
BUN: 22 mg/dL (ref 6–23)
CO2: 24 mmol/L (ref 19–32)
Calcium: 8.3 mg/dL — ABNORMAL LOW (ref 8.4–10.5)
Chloride: 114 mEq/L — ABNORMAL HIGH (ref 96–112)
Creatinine, Ser: 1.31 mg/dL — ABNORMAL HIGH (ref 0.50–1.10)
GFR calc non Af Amer: 37 mL/min — ABNORMAL LOW (ref >90)
GFR, EST AFRICAN AMERICAN: 42 mL/min — AB (ref >90)
Glucose, Bld: 97 mg/dL (ref 70–99)
Potassium: 3.7 mmol/L (ref 3.5–5.1)
SODIUM: 145 mmol/L (ref 135–145)
Total Protein: 5.5 g/dL — ABNORMAL LOW (ref 6.0–8.3)

## 2014-04-22 LAB — URINE CULTURE
COLONY COUNT: NO GROWTH
CULTURE: NO GROWTH

## 2014-04-22 LAB — HEMOGLOBIN A1C
HEMOGLOBIN A1C: 7 % — AB (ref <5.7)
MEAN PLASMA GLUCOSE: 154 mg/dL — AB (ref <117)

## 2014-04-22 LAB — URINE MICROSCOPIC-ADD ON

## 2014-04-22 LAB — CBC
HCT: 31 % — ABNORMAL LOW (ref 36.0–46.0)
Hemoglobin: 10 g/dL — ABNORMAL LOW (ref 12.0–15.0)
MCH: 30 pg (ref 26.0–34.0)
MCHC: 32.3 g/dL (ref 30.0–36.0)
MCV: 93.1 fL (ref 78.0–100.0)
PLATELETS: 223 10*3/uL (ref 150–400)
RBC: 3.33 MIL/uL — ABNORMAL LOW (ref 3.87–5.11)
RDW: 14.7 % (ref 11.5–15.5)
WBC: 9.5 10*3/uL (ref 4.0–10.5)

## 2014-04-22 LAB — TROPONIN I: Troponin I: <0.03 ng/mL (ref <0.031)

## 2014-04-22 LAB — GLUCOSE, CAPILLARY
GLUCOSE-CAPILLARY: 172 mg/dL — AB (ref 70–99)
GLUCOSE-CAPILLARY: 112 mg/dL — AB (ref 70–99)
Glucose-Capillary: 85 mg/dL (ref 70–99)
Glucose-Capillary: 125 mg/dL — ABNORMAL HIGH (ref 70–99)
Glucose-Capillary: 121 mg/dL — ABNORMAL HIGH (ref 70–99)

## 2014-04-22 LAB — T4, FREE: Free T4: 1.07 ng/dL (ref 0.80–1.80)

## 2014-04-22 LAB — MRSA PCR SCREENING: MRSA by PCR: NEGATIVE

## 2014-04-22 MED ORDER — ADULT MULTIVITAMIN W/MINERALS CH
1.0000 | ORAL_TABLET | Freq: Every day | ORAL | Status: DC
Start: 2014-04-22 — End: 2014-04-23
  Administered 2014-04-22 – 2014-04-23 (×2): 1 via ORAL
  Filled 2014-04-22 (×2): qty 1

## 2014-04-22 MED ORDER — HYDROCORTISONE 2.5 % RE CREA
TOPICAL_CREAM | Freq: Two times a day (BID) | RECTAL | Status: DC
  Administered 2014-04-22: 1 via RECTAL
  Administered 2014-04-22: 12:00:00 via RECTAL
  Filled 2014-04-22: qty 28.35

## 2014-04-22 NOTE — Progress Notes (Signed)
OT Cancellation Note  Patient Details Name: [name] MRN: [id number] DOB: 1929-03-27   Cancelled Treatment:    Spoke with pt and family. Pt lives at [facility] and plans to return .  Will defer OT eval to SNF [facility], Thereasa Parkin 04/22/2014, 10:14 AM

## 2014-04-22 NOTE — Evaluation (Signed)
Physical Therapy Evaluation Patient Details Name: [name] MRN: [id number] DOB: Sep 08, 1928 Today's Date: 04/22/2014   History of Present Illness  81 yo female admitted with syncope. Marland Kitchen Hx of DM, skin cancer, pacemaker, CAD  Clinical Impression  On eval, pt required Min guard assist for mobility-able to ambulate ~200 feet with RW. Recommend return to SNF to continue rehab.     Follow Up Recommendations SNF    Equipment Recommendations  None recommended by PT    Recommendations for Other Services       Precautions / Restrictions Precautions Precautions: Fall Restrictions Weight Bearing Restrictions: No      Mobility  Bed Mobility Overal bed mobility: Needs Assistance Bed Mobility: Supine to Sit;Sit to Supine     Supine to sit: Min guard;HOB elevated Sit to supine: Min guard;HOB elevated   General bed mobility comments: increased time. reliance on bedrail  Transfers Overall transfer level: Needs assistance Equipment used: Rolling walker (2 wheeled) Transfers: Sit to/from Stand Sit to Stand: Min guard         General transfer comment: close guard for safety.   Ambulation/Gait Ambulation/Gait assistance: Min guard Ambulation Distance (Feet): 200 Feet Assistive device: Rolling walker (2 wheeled) Gait Pattern/deviations: Step-through pattern;Decreased stride length     General Gait Details: good gait speed. pt tolerated distance well. no lob or c/o dizziness  Stairs            Wheelchair Mobility    Modified Rankin (Stroke Patients Only)       Balance Overall balance assessment: Needs assistance         Standing balance support: Bilateral upper extremity supported;During functional activity Standing balance-Leahy Scale: Poor                               Pertinent Vitals/Pain Pain Assessment: No/denies pain    Home Living Family/patient expects to be discharged to:: Skilled nursing facility                     Prior Function Level of Independence: Needs assistance               Hand Dominance        Extremity/Trunk Assessment   Upper Extremity Assessment: Overall WFL for tasks assessed           Lower Extremity Assessment: Generalized weakness      Cervical / Trunk Assessment: Kyphotic  Communication   Communication: HOH  Cognition Arousal/Alertness: Awake/alert Behavior During Therapy: WFL for tasks assessed/performed Overall Cognitive Status: Within Functional Limits for tasks assessed                      General Comments      Exercises        Assessment/Plan    PT Assessment Patient needs continued PT services  PT Diagnosis Difficulty walking;Generalized weakness   PT Problem List Decreased mobility;Decreased balance  PT Treatment Interventions DME instruction;Gait training;Functional mobility training;Therapeutic activities;Patient/family education;Therapeutic exercise;Balance training   PT Goals (Current goals can be found in the Care Plan section) Acute Rehab PT Goals Patient Stated Goal: none stated PT Goal Formulation: With patient Time For Goal Achievement: 05/06/14 Potential to Achieve Goals: Good    Frequency Min 2X/week   Barriers to discharge        Co-evaluation               End of Session  Equipment Utilized During Treatment: Gait belt Activity Tolerance: Patient tolerated treatment well Patient left: in bed;with call bell/phone within reach;with bed alarm set           Time: 1346-1402 PT Time Calculation (min) (ACUTE ONLY): 16 min   Charges:   PT Evaluation $Initial PT Evaluation Tier I: 1 Procedure PT Treatments $Gait Training: 8-22 mins   PT G Codes:        Weston Anna, MPT Pager: [phone]

## 2014-04-22 NOTE — Progress Notes (Signed)
To provide support and encouragement, care continuity and to assess for needs, visited patient in [place].  She reported that she is being DC'd tomorrow.    In response to her inquiry, I reviewed her RT schedule, answered her questions re: SEs. She understands she can contact me with questions/concerns.  Gayleen Orem, RN, BSN, [facility] [place] [phone]

## 2014-04-22 NOTE — Telephone Encounter (Signed)
RECEIVED MESSAGE FROM DR. SQUIRE STATING THAT THE LAB (TSH) CAN BE CANCELLED FOR THIS PATIENT, SEEING THAT SHE IS NOW AN IN-PATIENT.

## 2014-04-22 NOTE — Telephone Encounter (Signed)
Called [name] to inform her of pt's scheduled radiation treatment dates/times. She is aware that the patient's pacemaker is to be tested following radiation treatments per Dr Sallyanne Kuster. [name] verbalized understanding and then informed this RN that [name] was admitted to hospital. Per Epic pt admitted on 04/21/14 for syncope, "passing out", c/o dysuria. She is being treated for hypothermia as well. Will route this to Dr Isidore Moos. Patient is to begin radiation treatment on 04/30/14.

## 2014-04-22 NOTE — Progress Notes (Signed)
INITIAL NUTRITION ASSESSMENT  DOCUMENTATION CODES Per approved criteria  -Not Applicable   INTERVENTION: -Encouraged intake of soft high protein foods -Trial [employer] Instant Breakfast w/8oz 2% milk; RD to increase frequency pending supplement acceptance -Recommend MVI -RD to continue to monitor   NUTRITION DIAGNOSIS: Inadequate oral intake related to food preferences/difficulty masticating foods as evidenced by PO intake< 75%, hx of unintentional wt loss   Goal: Pt to meet >/= 90% of their estimated nutrition needs    Monitor:  Total protein/energy intake, supplement tolerance, swallow profile, labs, weights  Reason for Assessment: MST  81 y.o. female  Admitting Dx: Syncope  ASSESSMENT: 81 year old Caucasian female presented after a syncopal episode. She was also hypothermic. She was admitted for further management. Syncope. Workup is pending. Also experiencing small amounts of blood per rectum.  Pt's family reported poor diet quality d/t pt's particular food preferences and difficulty with certain food textures as pt s/p parotidectomy  Diet recall indicates pt consuming oatmeal for breakfast, and grilled cheese for lunch and dinner. Does not tolerate textures of tough proteins, and does not enjoy taste of nutrition supplements (has tried Ensure/Boost/Glucerna) Pt resides at SNF, who assists in preparing meals.   Encouraged intake of soft proteins; incorporating eggs, yogurt, milk, cheese into meals. Pt agreeable to eggs and yogurt. Willing to trial [employer] once.  Endorsed ongoing weight loss for past 3 months. Pt's family reported ~20 lbs; however medical records indicate 20 lb wt loss over past 8 months (12% body weight loss, non-signficiant for time frame)  No signs of muscle wasting or fat loss noted, however, bilateral leg edema may be masking muscle wasting   Height: Ht Readings from Last 1 Encounters:  04/21/14 5\' 4"  (1.626 m)     Weight: Wt Readings from Last 1 Encounters:  04/22/14 168 lb 14 oz (76.6 kg)    Ideal Body Weight: 120 lb  % Ideal Body Weight: 140%  Wt Readings from Last 10 Encounters:  04/22/14 168 lb 14 oz (76.6 kg)  04/09/14 173 lb 14.4 oz (78.881 kg)  11/19/13 179 lb 9.6 oz (81.466 kg)  08/27/13 189 lb (85.73 kg)  11/23/12 190 lb 3.2 oz (86.274 kg)  08/29/12 194 lb (87.998 kg)  02/15/12 197 lb 5 oz (89.5 kg)  03/22/11 170 lb (77.111 kg)    Usual Body Weight: 190 lbs 8 months ago  % Usual Body Weight: 88%  BMI:  Body mass index is 28.97 kg/(m^2).  Estimated Nutritional Needs: Kcal: 1550-1750 Protein: 75-85 gram Fluid: >/= 1750 ml daily  Skin: + 2 RLE and LLE edema  Diet Order: Diet Carb Modified  EDUCATION NEEDS: -Education needs addressed   Intake/Output Summary (Last 24 hours) at 04/22/14 1213 Last data filed at 04/22/14 0413  Gross per 24 hour  Intake 656.25 ml  Output   1000 ml  Net -343.75 ml    Last BM: 1/19   Labs:   Recent Labs Lab 04/21/14 1244 04/21/14 2006 04/22/14 0700  NA 141 145 145  K 5.6* 3.9 3.7  CL 107 112 114*  CO2 26 25 24   BUN 26* 24* 22  CREATININE 1.71* 1.53* 1.31*  CALCIUM 9.1 8.7 8.3*  GLUCOSE 143* 136* 97    CBG (last 3)   Recent Labs  04/21/14 2129 04/22/14 0728 04/22/14 1155  GLUCAP 172* 85 125*    Scheduled Meds: . aspirin EC  81 mg Oral Daily  . calcitRIOL  0.25 mcg Oral Daily  . gabapentin  300  mg Oral BID  . hydrocortisone   Rectal BID  . insulin aspart  0-9 Units Subcutaneous TID WC  . insulin glargine  10 Units Subcutaneous QHS  . isosorbide mononitrate  30 mg Oral Daily  . levothyroxine  50 mcg Oral QAC breakfast  . metoprolol tartrate  25 mg Oral BID  . [START ON 04/23/2014] oxybutynin  10 mg Oral QODAY  . polyvinyl alcohol  2 drop Left Eye TID  . simvastatin  20 mg Oral QHS  . sodium chloride  3 mL Intravenous Q12H    Continuous Infusions:   Past Medical History  Diagnosis Date  . CHF  (congestive heart failure)   . Coronary artery disease     multiple PCI; echo 09/15/10-EF45-50%, aortic valve sclerosis without stenosis; myoview 03/23/11-no reversible defects, EF66%  . Hypertension   . Pacemaker 09/2010    sinus node dysfunction, sinus bradycardia  . Hyperlipidemia   . Hypothyroidism   . Carotid bruit 10/25/2002    carotid doppler- normal  . Myocardial infarction   . Insulin dependent diabetes mellitus     fasting cbg 160s  . Chronic kidney disease     stage IV renal disease  . 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 Osteopenia   . COPD (chronic obstructive pulmonary disease)   . Stroke about 20 years ago     1998  . Skin cancer     ermoved from left side of face    Past Surgical History  Procedure Laterality Date  . Insert / replace / remove pacemaker  09/21/10    medtronic Adapta   . Coronary angioplasty with stent placement  09/13/10    preserved LV function, PTCA/stenting of the RCA with insertion of two stents, 2.75x38mm stent proximal RCA and 2.25x25mm at the distal RCA, post stent dilatation up to 2.62mm  . Cardiac catheterization  05/28/10    EF 65%, medical therapy  . Coronary angioplasty with stent placement  06/04/09    PCI, stenting of the mid and prox RCA with Promus DES  . Cardiac catheterization  06/03/09    PCI, stenting of the major oblique marginal arteries off the circ  . Coronary angioplasty with stent placement  10/16/2002    stenting of mid RCA with 2.75x80mm Cypher stent deployed at 16 atmospheres; EF 60  . Coronary angioplasty with stent placement  11/29/2001    2.75x87mm cutting balloon angioplasty of the in stent restenosis of RCA, bracytherapy of the in stend restenotic segment of mid RCA  . Cardiac catheterization  11/28/2001    high grade isten restenosis of RCA stent placed 03/02/01, proceed with cutting balloon 8/28  . Coronary angioplasty with stent placement  03/02/2001    ptca and stenting of RCA (2.75x67mm Ceta) after predilatation with a  2.5x78mm CrossSail and OpenSail balloon; EF 60%  . Tumor removal      on side of neck  . Parotidectomy Left 03/14/2014    Procedure: LEFT PAROTIDECTOMY WITH FACIAL NERVE DISSECTION ;  Surgeon: Rozetta Nunnery, MD;  Location: [place];  Service: ENT;  Laterality: Left;  Total parotidectomy with facial nerve dissection, limited LEFT nerve dissection, LEFT tarsorrhaphy  . Radical neck dissection Left 03/14/2014    Procedure: LEFT NECK DISSECTION;  Surgeon: Rozetta Nunnery, MD;  Location: [place];  Service: ENT;  Laterality: Left;    Atlee Abide MS RD LDN Clinical Dietitian [phone]

## 2014-04-22 NOTE — Progress Notes (Signed)
Clinical Social Work Department BRIEF PSYCHOSOCIAL ASSESSMENT 04/22/2014  Patient:  [name], [name]     Account Number:  [id number]     [place] date:  04/21/2014  Clinical Social Worker:  Renold Genta  Date/Time:  04/22/2014 02:41 PM  Referred by:  Physician  Date Referred:  04/22/2014 Referred for  Other - See comment   Other Referral:   Admitted from: [facility]   Interview type:  Patient Other interview type:   and friend, [name] via phone    PSYCHOSOCIAL DATA Living Status:  FACILITY Admitted from facility:  [facility] Level of care:  [place] Primary support name:  Audry Riles (friend) h#: [phone] c#: [phone] Primary support relationship to patient:  FRIEND Degree of support available:   good    CURRENT CONCERNS Current Concerns  Post-Acute Placement   Other Concerns:    SOCIAL WORK ASSESSMENT / PLAN CSW received referral that patient was admitted from [facility].   Assessment/plan status:  Information/Referral to [employer] Other assessment/ plan:   Information/referral to community resources:   CSW completed FL2 and faxed information to [place].    PATIENT'S/FAMILY'S RESPONSE TO PLAN OF CARE: CSW spoke with patient & friend, [name] re: discharge planning. Patient was admitted from [facility] and plans to return there when ready for discharge. CSW spoke with [facility] & confirmed that they would be able to take patient back pending bed availability.          Raynaldo Opitz, [facility] Clinical Social Worker cell #: [phone]

## 2014-04-22 NOTE — Progress Notes (Signed)
[facility] PROGRESS NOTE  [name] OBS:[id number] DOB: October 24, 1928 DOA: 04/21/2014  PCP: Mathews Argyle, MD  Brief HPI: 81 year old Caucasian female presented after a syncopal episode. She was also hypothermic. She was admitted for further management. Syncope. Workup is pending. Also experiencing small amounts of blood per rectum.  Past medical history:  Past Medical History  Diagnosis Date  . CHF (congestive heart failure)   . Coronary artery disease     multiple PCI; echo 09/15/10-EF45-50%, aortic valve sclerosis without stenosis; myoview 03/23/11-no reversible defects, EF66%  . Hypertension   . Pacemaker 09/2010    sinus node dysfunction, sinus bradycardia  . Hyperlipidemia   . Hypothyroidism   . Carotid bruit 10/25/2002    carotid doppler- normal  . Myocardial infarction   . Insulin dependent diabetes mellitus     fasting cbg 160s  . Chronic kidney disease     stage IV renal disease  . 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 Osteopenia   . COPD (chronic obstructive pulmonary disease)   . Stroke about 20 years ago     1998  . Skin cancer     ermoved from left side of face    Consultants: None  Procedures:  2-D echocardiogram is pending  Lower extremity venous Doppler is pending  Interrogation of pacemaker is pending  Antibiotics: None  Subjective: Patient feels well this morning. She tells me that she hadn't eaten her breakfast or lunch yesterday and she blames that for her passing out spell. Denies any chest pain. Denies any history of colonoscopy and tells me that she would not like to have one.  Objective: Vital Signs  Filed Vitals:   04/21/14 1749 04/21/14 1812 04/21/14 2020 04/22/14 0359  BP: 122/46  148/60 122/63  Pulse: 76  85 65  Temp: 98.4 F (36.9 C)  97.8 F (36.6 C) 98.2 F (36.8 C)  TempSrc: Oral  Oral Oral  Resp: 18  18 16   Height:  5\' 4"  (1.626 m)    Weight:  77.2 kg (170 lb 3.1 oz)  76.6 kg (168 lb 14 oz)  SpO2: 94%   95% 98%    Intake/Output Summary (Last 24 hours) at 04/22/14 0941 Last data filed at 04/22/14 0413  Gross per 24 hour  Intake 656.25 ml  Output   1000 ml  Net -343.75 ml   Filed Weights   04/21/14 1812 04/22/14 0359  Weight: 77.2 kg (170 lb 3.1 oz) 76.6 kg (168 lb 14 oz)    General appearance: alert, cooperative, appears stated age, distracted and moderately obese Head: Postsurgical changes noted in the left side of the face Resp: clear to auscultation bilaterally Cardio: regular rate and rhythm, S1, S2 normal, no murmur, click, rub or gallop GI: soft, non-tender; bowel sounds normal; no masses,  no organomegaly and Hemorrhoid noted. Digital examination not done. Right leg larger than left. Neurologic: No focal deficits. Left facial nerve palsy due to recent surgery.  Lab Results:  Basic Metabolic Panel:  Recent Labs Lab 04/21/14 1244 04/21/14 2006 04/22/14 0700  NA 141 145 145  K 5.6* 3.9 3.7  CL 107 112 114*  CO2 26 25 24   GLUCOSE 143* 136* 97  BUN 26* 24* 22  CREATININE 1.71* 1.53* 1.31*  CALCIUM 9.1 8.7 8.3*   Liver Function Tests:  Recent Labs Lab 04/22/14 0700  AST 13  ALT 10  ALKPHOS 70  BILITOT 0.8  PROT 5.5*  ALBUMIN 2.9*   CBC:  Recent  Labs Lab 04/21/14 1244 04/22/14 0700  WBC 10.7* 9.5  NEUTROABS 6.5  --   HGB 11.2* 10.0*  HCT 34.8* 31.0*  MCV 93.5 93.1  PLT 236 223   Cardiac Enzymes:  Recent Labs Lab 04/21/14 1441 04/21/14 1843 04/21/14 2342 04/22/14 0700  TROPONINI <0.03 <0.03 <0.03 <0.03   CBG:  Recent Labs Lab 04/21/14 2129 04/22/14 0728  GLUCAP 172* 85    Recent Results (from the past 240 hour(s))  Blood culture (routine x 2)     Status: None (Preliminary result)   Collection Time: 04/21/14 12:45 PM  Result Value Ref Range Status   Specimen Description BLOOD RIGHT ANTECUBITAL  Final   Special Requests BOTTLES DRAWN AEROBIC AND ANAEROBIC 3 CC EACH  Final   Culture   Final           BLOOD CULTURE RECEIVED NO  GROWTH TO DATE CULTURE WILL BE HELD FOR 5 DAYS BEFORE ISSUING A FINAL NEGATIVE REPORT Performed at [facility]    Report Status PENDING  Incomplete  Blood culture (routine x 2)     Status: None (Preliminary result)   Collection Time: 04/21/14 12:48 PM  Result Value Ref Range Status   Specimen Description BLOOD LEFT ANTECUBITAL  Final   Special Requests BOTTLES DRAWN AEROBIC ONLY 3 CC  Final   Culture   Final           BLOOD CULTURE RECEIVED NO GROWTH TO DATE CULTURE WILL BE HELD FOR 5 DAYS BEFORE ISSUING A FINAL NEGATIVE REPORT Performed at [facility]    Report Status PENDING  Incomplete  MRSA PCR Screening     Status: None   Collection Time: 04/21/14  7:40 PM  Result Value Ref Range Status   MRSA by PCR NEGATIVE NEGATIVE Final    Comment:        The GeneXpert MRSA Assay (FDA approved for NASAL specimens only), is one component of a comprehensive MRSA colonization surveillance program. It is not intended to diagnose MRSA infection nor to guide or monitor treatment for MRSA infections.       Studies/Results: Dg Chest 2 View  04/21/2014   CLINICAL DATA:  Patient on antibiotic therapy for ear infection. Altered mental status. History of congestive heart failure.  EXAM: CHEST  2 VIEW  COMPARISON:  03/22/2011  FINDINGS: Multi lead pacer apparatus overlies the left hemi thorax, leads are stable in position. Stable cardiac and mediastinal contours, mildly enlarged. No large consolidative pulmonary opacities. No pleural effusion or pneumothorax. Mid thoracic spine degenerative change.  IMPRESSION: No acute cardiopulmonary process.   Electronically Signed   By: Lovey Newcomer M.D.   On: 04/21/2014 14:09   Ct Head Wo Contrast  04/21/2014   CLINICAL DATA:  81 year old female with syncopal episode today. Current planning radiation therapy for left facial skin cancer. Hypertension. Hyperlipidemia. Pacemaker. Initial encounter.  EXAM: CT HEAD WITHOUT CONTRAST  TECHNIQUE:  Contiguous axial images were obtained from the base of the skull through the vertex without intravenous contrast.  COMPARISON:  02/15/2012 head CT.  FINDINGS: No intracranial hemorrhage.  Remote infarcts involving mid left coronal radiata/left lenticular nucleus, right caudate head, right aspect of the pons and right cerebellum. Small vessel disease type changes. No CT evidence of large acute infarct.  Global atrophy without hydrocephalus.  No intracranial mass lesion noted on this unenhanced exam.  Prior left parotid gland resection.  Partial opacification inferior left mastoid air cells.  Vascular calcifications.  IMPRESSION: Remote infarcts and small  vessel disease type changes without CT evidence of large acute infarct.  No intracranial hemorrhage.   Electronically Signed   By: Chauncey Cruel M.D.   On: 04/21/2014 17:32    Medications:  Scheduled: . aspirin EC  81 mg Oral Daily  . calcitRIOL  0.25 mcg Oral Daily  . gabapentin  300 mg Oral BID  . hydrocortisone   Rectal BID  . insulin aspart  0-9 Units Subcutaneous TID WC  . insulin glargine  10 Units Subcutaneous QHS  . isosorbide mononitrate  30 mg Oral Daily  . levothyroxine  50 mcg Oral QAC breakfast  . metoprolol tartrate  25 mg Oral BID  . [START ON 04/23/2014] oxybutynin  10 mg Oral QODAY  . polyvinyl alcohol  2 drop Left Eye TID  . simvastatin  20 mg Oral QHS  . sodium chloride  3 mL Intravenous Q12H   Continuous:  [id number] **OR** acetaminophen, albuterol, ondansetron **OR** ondansetron (ZOFRAN) IV  Assessment/Plan:  Principal Problem:   Syncope Active Problems:   CHF (congestive heart failure) chronic and stable   Coronary artery disease   Hypothyroidism   Pacemaker   Insulin dependent diabetes mellitus   CAD (coronary artery disease)   Skin cancer of face   Hypothermia    Syncope Most likely due to poor oral intake yesterday. No documented hypoglycemia, however. Await interrogation of pacemaker. Await  echocardiogram. Troponins have been negative. CT head was unremarkable. Orthostatics pending.  Hypothermia Resolved. No clear reason for the same. TSH is noted to be elevated. Free T4 is normal. Blood cultures have been obtained. There is no real foci of infection. Hold off on antibiotics for now. Hypothermia was likely metabolic in etiology.  Possible mild acute on Chronic kidney disease with hyperkalemia Creatinine is improved this morning. Potassium is normal. No further need for IV fluids.  Bright red blood per rectum According to the nurse this has been very small quantities mixed with mucus. She does have hemorrhoids. She's never had a colonoscopy and is not interested in one either. Hemoglobin did drop some but this could be due to dilution. We'll continue to monitor for now. If she doesn't have significant bleeding and if hemoglobin remains stable, this can be pursued further as outpatient.  Right lower extremity swelling greater than left Venous Doppler is pending. She was unable to tell me this is chronic or not.  history of coronary artery disease Appears to be stable. Previous notes suggest that she was on aspirin and Plavix. For some unclear reason Plavix was recently discontinued. Continue aspirin for now. Investigate the reason for discontinuation of Plavix. Troponins negative. She had a stress test in December 2015, prior to her recent surgery, which was low risk. She had normal wall motion and normal LV function.  history of hypothyroidism TSH is elevated. Free T4 is normal. Synthroid dose was increased.   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 Radiation to begin on Wednesday 1/27. Management per oncology as outpatient.  History of insulin-dependent diabetes mellitus Continue with her long-acting insulin at a lower dose. Sliding scale coverage will be provided. HbA1c is pending   DVT  Prophylaxis: No heparin due to blood per rectum. SCDs on hold till Venous Doppler is completed.    Code Status: Full Code  Family Communication: Discussed with patient  Disposition Plan: Will return to SNF when stable.    LOS: 1 day   Siyah Mault  [facility] Pager [phone] 04/22/2014, 9:41 AM  If 7PM-7AM, please contact night-coverage at [email or web address], password [facility]

## 2014-04-22 NOTE — Care Management Note (Addendum)
    Page 1 of 1   04/23/2014     1:11:45 PM CARE MANAGEMENT NOTE 04/23/2014  Patient:  [name], [name]   Account Number:  [id number]  Date Initiated:  04/22/2014  Documentation initiated by:  Dessa Phi  Subjective/Objective Assessment:   81 y/o f admitted w/syncope.hx:cva, pacemaker.     Action/Plan:   From [facility]   Anticipated DC Date:  04/23/2014   Anticipated DC Plan:  [place]  CM consult      Choice offered to / List presented to:             Status of service:  Completed, signed off Medicare Important Message given?   (If response is "NO", the following Medicare IM given date fields will be blank) Date Medicare IM given:   Medicare IM given by:   Date Additional Medicare IM given:   Additional Medicare IM given by:    Discharge Disposition:  [place]  Per UR Regulation:  Reviewed for med. necessity/level of care/duration of stay  If discussed at [place] of Stay Meetings, dates discussed:    Comments:  04/23/14 Dessa Phi RN BSN NCM [phone] D/C back to SNF.  04/22/14 Dessa Phi RN BSN NCM [phone] Monitor progress for d/c needs.

## 2014-04-22 NOTE — Progress Notes (Signed)
  Echocardiogram 2D Echocardiogram has been performed.  [name] 04/22/2014, 2:37 PM

## 2014-04-22 NOTE — Progress Notes (Signed)
*  PRELIMINARY RESULTS* Vascular Ultrasound Lower extremity venous duplex has been completed.  Preliminary findings: no evidence of DVT  Landry Mellow, RDMS, RVT  04/22/2014, 2:16 PM

## 2014-04-23 DIAGNOSIS — I2510 Atherosclerotic heart disease of native coronary artery without angina pectoris: Secondary | ICD-10-CM

## 2014-04-23 LAB — CBC
HEMATOCRIT: 30.1 % — AB (ref 36.0–46.0)
Hemoglobin: 9.8 g/dL — ABNORMAL LOW (ref 12.0–15.0)
MCH: 30.1 pg (ref 26.0–34.0)
MCHC: 32.6 g/dL (ref 30.0–36.0)
MCV: 92.3 fL (ref 78.0–100.0)
PLATELETS: 226 10*3/uL (ref 150–400)
RBC: 3.26 MIL/uL — ABNORMAL LOW (ref 3.87–5.11)
RDW: 14.7 % (ref 11.5–15.5)
WBC: 10.4 10*3/uL (ref 4.0–10.5)

## 2014-04-23 LAB — BASIC METABOLIC PANEL
Anion gap: 8 (ref 5–15)
BUN: 19 mg/dL (ref 6–23)
CALCIUM: 8.9 mg/dL (ref 8.4–10.5)
CO2: 24 mmol/L (ref 19–32)
Chloride: 112 mEq/L (ref 96–112)
Creatinine, Ser: 1.25 mg/dL — ABNORMAL HIGH (ref 0.50–1.10)
GFR calc Af Amer: 45 mL/min — ABNORMAL LOW (ref >90)
GFR, EST NON AFRICAN AMERICAN: 39 mL/min — AB (ref >90)
Glucose, Bld: 123 mg/dL — ABNORMAL HIGH (ref 70–99)
Potassium: 4 mmol/L (ref 3.5–5.1)
SODIUM: 144 mmol/L (ref 135–145)

## 2014-04-23 LAB — CORTISOL-AM, BLOOD: Cortisol - AM: 11.8 ug/dL (ref 4.3–22.4)

## 2014-04-23 MED ORDER — INSULIN GLARGINE 100 UNIT/ML ~~LOC~~ SOLN: 14.0000 [IU] | Freq: Every day | SUBCUTANEOUS | Status: DC

## 2014-04-23 NOTE — Progress Notes (Signed)
Attempted to call report to [facility].  Remained on hold for 5 minutes. No response. Stacey Drain

## 2014-04-23 NOTE — Progress Notes (Signed)
Patient is set to discharge to [facility] ([facility] did not have a bed available & patient/family chose not to pay for bed hold) today. Patient & friends, Judithann Graves at bedside aware. Discharge packet given to RN, Hinton Dyer. Friends to transport to SNF.   Clinical Social Work Department CLINICAL SOCIAL WORK PLACEMENT NOTE 04/23/2014  Patient:  [name], [name]  Account Number:  [id number] Admit date:  04/21/2014  Clinical Social Worker:  Renold Genta  Date/time:  04/22/2014 02:54 PM  Clinical Social Work is seeking post-discharge placement for this patient at the following level of care:   SKILLED NURSING   (*CSW will update this form in Epic as items are completed)   04/22/2014  Patient/family provided with [facility] Department of Clinical Social Work's list of facilities offering this level of care within the geographic area requested by the patient (or if unable, by the patient's family).  04/22/2014  Patient/family informed of their freedom to choose among providers that offer the needed level of care, that participate in Medicare, Medicaid or managed care program needed by the patient, have an available bed and are willing to accept the patient.  04/22/2014  Patient/family informed of [facility]' ownership interest in [facility], as well as of the fact that they are under no obligation to receive care at this facility.  PASARR submitted to EDS on 04/22/2014 PASARR number received on 04/22/2014  FL2 transmitted to all facilities in geographic area requested by pt/family on  04/22/2014 FL2 transmitted to all facilities within larger geographic area on   Patient informed that his/her managed care company has contracts with or will negotiate with  certain facilities, including the following:     Patient/family informed of bed offers received:  04/23/2014 Patient chooses bed at [facility], [place] Physician  recommends and patient chooses bed at    Patient to be transferred to [facility] on  04/23/2014 Patient to be transferred to facility by friends Patient and family notified of transfer on 04/23/2014 Name of family member notified:  friends at bedside  The following physician request were entered in Epic:   Additional Comments:   Raynaldo Opitz, [place] Social Worker cell #: [phone]

## 2014-04-23 NOTE — Discharge Summary (Signed)
Physician Discharge Summary  [name] VVO:[id number] DOB: 12/13/1928 DOA: 04/21/2014  PCP: Mathews Argyle, MD  Admit date: 04/21/2014 Discharge date: 04/23/2014  Time spent: 40 minutes  Recommendations for Outpatient Follow-up:   1. Please confirm with cardiology whether patient is to be on aspirin + Plavix or just aspirin monotherapy 2. Follow-up as an outpatient with Dr. Orene Desanctis for regular cardiology care, pacemaker interrogation etc etc 3. Consider repeat echocardiogram in about 3 months as per cardiology given echocardiogram showing new onset grade 2 diastolic dysfunction with moderate LAE and MR 4. Daily weights, strict I's and O's negative for at least one week 5. Follow-up with Dr. Lanell Persons of radiation oncology for further management of skin cancer 6. Consider TSH in one to 2 months 7. Regular screening for diabetes mellitus in 3 months   Discharge Diagnoses:  Principal Problem:   Syncope Active Problems:   CHF (congestive heart failure) chronic and stable   Coronary artery disease   Hypothyroidism   Pacemaker   Insulin dependent diabetes mellitus   CAD (coronary artery disease)   Skin cancer of face   Hypothermia   Discharge Condition: Improved  Diet recommendation: Heart healthy low-salt diabetic  Filed Weights   04/21/14 1812 04/22/14 0359  Weight: 77.2 kg (170 lb 3.1 oz) 76.6 kg (168 lb 14 oz)    History of present illness:  81 y/o ? 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 No documented hypoglycemia, however. PPM interrogated 1/18 and pacing and sensing normally with an estimated device life 10-14 years   Echocardiogram 1/19 showed grade 2 diastolic dysfunction with EF 66 5% moderate MR and Lae  Troponins have been negative. CT head was unremarkable. Orthostatics negative  Hypothermia  Resolved. No clear reason for the same. TSH is noted to be elevated. Free T4 is normal. Blood cultures have been obtained. There is no real foci of infection-so we didn't start antibiotics Hypothermia was likely metabolic in etiology.  Possible mild acute on Chronic kidney disease with hyperkalemia  Creatinine is improved this morning. Potassium is normal. No further need for IV fluids.  Bright red blood per rectum  According to the nurse this has been very small quantities mixed with mucus. She does have hemorrhoids. She's never had a colonoscopy and is not interested in one either. Hemoglobin did drop some but this could be due to dilution. We'll continue to monitor for now. If she doesn't have significant bleeding and if hemoglobin remains stable, this can be pursued further as outpatient.  Her hemoglobin on day of discharge was 9.8 Right lower extremity swelling greater than left  Venous Doppler performed 1/19 were negative for clot history of coronary artery disease  Appears to be stable. Previous notes suggest that she was on aspirin and Plavix.  for some unclear reason Plavix was recently discontinued.  Continue aspirin for now. Investigate the reason for discontinuation of Plavix at the nursing facility she has had stents placed in 2011 2012 which were drug-eluting so by the guidelines, she should have dual antiplatelet therapy for one year which would've ended in 2013 Will defer this decision making to cardiology as an outpatient  Troponins negative.  She  had a stress test in December 2015, prior to her recent surgery, which was low risk. She had normal wall motion and normal LV function.  Note will be forwarded to her primary cardiologist Dr. Gwenlyn Found Croituru history of hypothyroidism  TSH is elevated.  Free T4 is normal. Synthroid dose was increased.  Recent squamous cell cancer involving left parotid gland as well as skin  Status post left parotidectomy in December 2015.  Radiation to begin on Wednesday 1/27.  Note will be forwarded to her radiation oncologist Dr. Lanell Persons History of insulin-dependent diabetes mellitus , A1c this admission 7.0 Continue with her long-acting insulin at new dose of 14 units every afternoon on discharge to skilled facility Sliding scale coverage to be provided at nursing facility   Procedures:  Echocardiogram  CT head  Doppler ultrasound  Pacemaker interrogation  Discharge Exam: Filed Vitals:   04/23/14 0404  BP: 139/85  Pulse: 86  Temp: 98.9 F (37.2 C)  Resp: 18    General: Alert pleasant oriented, obese, fascial dissymmetry as well as surgical/postop changes Cardiovascular: S1-S2 no murmur rub or gallop, telemetry paced rhythm, pauses less than 1.5 seconds Respiratory: Clinically clear no added sound  Discharge Instructions   Discharge Instructions    Diet - low sodium heart healthy    Complete by:  As directed      Increase activity slowly    Complete by:  As directed           Current Discharge Medication List    CONTINUE these medications which have CHANGED   Details  insulin glargine (LANTUS) 100 UNIT/ML injection Inject 0.14 mLs (14 Units total) into the skin at bedtime. Qty: 10 mL, Refills: 11      CONTINUE these medications which have NOT CHANGED   Details  erythromycin ophthalmic ointment Place 1 application into the left eye every 4 (four) hours while awake.     furosemide (LASIX) 40 MG tablet Take 0.5 tablets (20 mg total) by mouth every other day. Qty: 30 tablet    gabapentin (NEURONTIN) 300 MG capsule Take 300 mg by mouth 2 (two) times daily.     HYDROcodone-acetaminophen (NORCO/VICODIN) 5-325 MG per tablet Take 1 tablet by mouth every 6 (six) hours as needed for moderate pain. Qty: 30 tablet, Refills: 0     isosorbide mononitrate (IMDUR) 30 MG 24 hr tablet Take 1 tablet (30 mg total) by mouth daily. Qty: 30 tablet, Refills: 5    levothyroxine (SYNTHROID, LEVOTHROID) 25 MCG tablet Take 25 mcg by mouth daily.      lisinopril (PRINIVIL,ZESTRIL) 5 MG tablet Take 5 mg by mouth daily.    metoprolol tartrate (LOPRESSOR) 25 MG tablet Take 25 mg by mouth 2 (two) times daily.      nitroGLYCERIN (NITROSTAT) 0.4 MG SL tablet Place 1 tablet (0.4 mg total) under the tongue every 5 (five) minutes as needed. Chest pain Qty: 25 tablet, Refills: 3    oxybutynin (DITROPAN-XL) 10 MG 24 hr tablet Take 10 mg by mouth every other day.    polyvinyl alcohol (LIQUIFILM TEARS) 1.4 % ophthalmic solution Place 2 drops into the left eye 3 (three) times daily. Qty: 15 mL, Refills: 0    simvastatin (ZOCOR) 20 MG tablet Take 20 mg by mouth at bedtime.     triamcinolone (KENALOG) 0.025 % cream Apply 1 application topically 2 (two) times daily. Apply to legs twice a day for 10 days      STOP taking these medications  azithromycin (ZITHROMAX) 250 MG tablet      calcitRIOL (ROCALTROL) 0.25 MCG capsule      ciprofloxacin-dexamethasone (CIPRODEX) otic suspension      clopidogrel (PLAVIX) 75 MG tablet        No Known Allergies    The results of significant diagnostics from this hospitalization (including imaging, microbiology, ancillary and laboratory) are listed below for reference.    Significant Diagnostic Studies: Dg Chest 2 View  04/21/2014   CLINICAL DATA:  Patient on antibiotic therapy for ear infection. Altered mental status. History of congestive heart failure.  EXAM: CHEST  2 VIEW  COMPARISON:  03/22/2011  FINDINGS: Multi lead pacer apparatus overlies the left hemi thorax, leads are stable in position. Stable cardiac and mediastinal contours, mildly enlarged. No large consolidative pulmonary opacities. No pleural effusion or pneumothorax. Mid thoracic spine degenerative change.  IMPRESSION: No acute  cardiopulmonary process.   Electronically Signed   By: Lovey Newcomer M.D.   On: 04/21/2014 14:09   Ct Head Wo Contrast  04/21/2014   CLINICAL DATA:  81 year old female with syncopal episode today. Current planning radiation therapy for left facial skin cancer. Hypertension. Hyperlipidemia. Pacemaker. Initial encounter.  EXAM: CT HEAD WITHOUT CONTRAST  TECHNIQUE: Contiguous axial images were obtained from the base of the skull through the vertex without intravenous contrast.  COMPARISON:  02/15/2012 head CT.  FINDINGS: No intracranial hemorrhage.  Remote infarcts involving mid left coronal radiata/left lenticular nucleus, right caudate head, right aspect of the pons and right cerebellum. Small vessel disease type changes. No CT evidence of large acute infarct.  Global atrophy without hydrocephalus.  No intracranial mass lesion noted on this unenhanced exam.  Prior left parotid gland resection.  Partial opacification inferior left mastoid air cells.  Vascular calcifications.  IMPRESSION: Remote infarcts and small vessel disease type changes without CT evidence of large acute infarct.  No intracranial hemorrhage.   Electronically Signed   By: Chauncey Cruel M.D.   On: 04/21/2014 17:32   Ct Soft Tissue Neck Wo Contrast  04/12/2014   CLINICAL DATA:  Staging of squamous cell skin cancer metastatic to the neck. Status post parotidectomy and radical neck dissection 03/14/2014. IV contrast could be administered due to diminished renal function.  EXAM: CT NECK WITHOUT CONTRAST  TECHNIQUE: Multidetector CT imaging of the neck was performed following the standard protocol without intravenous contrast.  COMPARISON:  03/03/2014  FINDINGS: The visualized portion of the brain is unremarkable. Minimal right maxillary sinus mucosal thickening is noted. A left mastoid effusion is partially visualized.  The nasopharynx, oropharynx, oral cavity, and larynx are unremarkable. Submandibular glands and right parotid gland are unremarkable.  Sequelae of interval left parotidectomy and left neck dissection are identified. There is soft tissue thickening and stranding at the parotidectomy site and involving the inferior aspect of the left ear. Stranding extends anteriorly and inferiorly towards the superior aspect of the left submandibular gland and inferiorly about the sternocleidomastoid. These likely represent postsurgical changes without a discrete mass identified, although evaluation is somewhat limited by lack of IV contrast. Soft tissue stranding also extends posterosuperiorly from the parotidectomy site towards the skullbase and towards the left stylomastoid foramen, although the foramen itself appears normal with a small amount of fat present and no osseous changes.  A heterogeneous hypoattenuating nodule is again seen in the right thyroid lobe, measuring approximately 1.4 cm in size (previously 1.3 cm). No enlarged lymph nodes are identified in the neck. Atherosclerotic calcification is again noted involving the carotid arteries.  Left-sided pacemaker leads are partially visualized. Advanced multilevel disc degeneration and facet arthrosis are noted in the cervical spine.  IMPRESSION: Interval left parotidectomy and neck dissection. Soft tissue thickening and stranding at the operative site likely reflect postoperative changes without a discrete mass or cervical lymphadenopathy identified allowing for lack of IV contrast.   Electronically Signed   By: Logan Bores   On: 04/12/2014 10:19   Ct Chest Wo Contrast  04/12/2014   CLINICAL DATA:  Staging metastatic squamous cell skin cancer to face  EXAM: CT CHEST WITHOUT CONTRAST  TECHNIQUE: Multidetector CT imaging of the chest was performed following the standard protocol without IV contrast.  COMPARISON:  None.  FINDINGS: Evaluation of the lung parenchyma is constrained by respiratory motion.  No suspicious pulmonary nodules. Mild dependent atelectasis in the bilateral lower lobes. No pleural  effusion or pneumothorax.  Visualized right thyroid is enlarged/nodular.  The heart is top-normal in size. No pericardial effusion. Coronary atherosclerosis. Atherosclerotic calcifications of the aortic arch. Left subclavian pacemaker.  No suspicious mediastinal or axillary lymphadenopathy.  Visualized upper abdomen is notable for vascular calcifications, bilateral adrenal calcifications (likely sequela of prior infection or hemorrhage), cholelithiasis, and a left upper pole renal calculus versus vascular calcification.  Degenerative changes of the visualized thoracolumbar spine.  IMPRESSION: No evidence of metastatic disease to the chest.  Additional ancillary findings as above.   Electronically Signed   By: Julian Hy M.D.   On: 04/12/2014 10:10    Microbiology: Recent Results (from the past 240 hour(s))  Blood culture (routine x 2)     Status: None (Preliminary result)   Collection Time: 04/21/14 12:45 PM  Result Value Ref Range Status   Specimen Description BLOOD RIGHT ANTECUBITAL  Final   Special Requests BOTTLES DRAWN AEROBIC AND ANAEROBIC 3 CC EACH  Final   Culture   Final           BLOOD CULTURE RECEIVED NO GROWTH TO DATE CULTURE WILL BE HELD FOR 5 DAYS BEFORE ISSUING A FINAL NEGATIVE REPORT Performed at [facility]    Report Status PENDING  Incomplete  Blood culture (routine x 2)     Status: None (Preliminary result)   Collection Time: 04/21/14 12:48 PM  Result Value Ref Range Status   Specimen Description BLOOD LEFT ANTECUBITAL  Final   Special Requests BOTTLES DRAWN AEROBIC ONLY 3 CC  Final   Culture   Final           BLOOD CULTURE RECEIVED NO GROWTH TO DATE CULTURE WILL BE HELD FOR 5 DAYS BEFORE ISSUING A FINAL NEGATIVE REPORT Performed at [facility]    Report Status PENDING  Incomplete  Urine culture     Status: None   Collection Time: 04/21/14  1:18 PM  Result Value Ref Range Status   Specimen Description URINE, CATHETERIZED  Final   Special  Requests NONE  Final   Colony Count NO GROWTH Performed at [facility]   Final   Culture NO GROWTH Performed at [facility]   Final   Report Status 04/22/2014 FINAL  Final  MRSA PCR Screening     Status: None   Collection Time: 04/21/14  7:40 PM  Result Value Ref Range Status   MRSA by PCR NEGATIVE NEGATIVE Final    Comment:        The GeneXpert MRSA Assay (FDA approved for NASAL specimens only), is one component of a comprehensive MRSA colonization surveillance program. It is not intended  to diagnose MRSA infection nor to guide or monitor treatment for MRSA infections.      Labs: Basic Metabolic Panel:  Recent Labs Lab 04/21/14 1244 04/21/14 2006 04/22/14 0700 04/23/14 0618  NA 141 145 145 144  K 5.6* 3.9 3.7 4.0  CL 107 112 114* 112  CO2 _0 GLUCOSE 143* 136* 97 123*  BUN 26* 24* 22 19  CREATININE 1.71* 1.53* 1.31* 1.25*  CALCIUM 9.1 8.7 8.3* 8.9   Liver Function Tests:  Recent Labs Lab 04/22/14 0700  AST 13  ALT 10  ALKPHOS 70  BILITOT 0.8  PROT 5.5*  ALBUMIN 2.9*   No results for input(s): LIPASE, AMYLASE in the last 168 hours. No results for input(s): AMMONIA in the last 168 hours. CBC:  Recent Labs Lab 04/21/14 1244 04/22/14 0700 04/23/14 0618  WBC 10.7* 9.5 10.4  NEUTROABS 6.5  --   --   HGB 11.2* 10.0* 9.8*  HCT 34.8* 31.0* 30.1*  MCV 93.5 93.1 92.3  PLT 236 223 226   Cardiac Enzymes:  Recent Labs Lab 04/21/14 1441 04/21/14 1843 04/21/14 2342 04/22/14 0700  TROPONINI <0.03 <0.03 <0.03 <0.03   BNP: BNP (last 3 results) No results for input(s): PROBNP in the last 8760 hours. CBG:  Recent Labs Lab 04/21/14 2129 04/22/14 0728 04/22/14 1155 04/22/14 1650 04/22/14 2148  GLUCAP 172* 85 125* 121* 112*       Signed:  Nita Sells  [facility] 04/23/2014, 9:47 AM

## 2014-04-24 ENCOUNTER — Telehealth: Payer: Self-pay | Admitting: *Deleted

## 2014-04-24 NOTE — Telephone Encounter (Signed)
Called patient's friend, Karel Jarvis to inform her of pt's upcoming treatment dates/times. Requested call back; left this RN's name and number.

## 2014-04-24 NOTE — Telephone Encounter (Signed)
10:40 am Received call back from [facility], [phone].  Verified that she will transport pt to every radiation treatment. Gave her patient's dates/times for all of her radiation treatments. Informed her that Dannial Monarch, [employer] representative will check Ms Brix's pacemaker following last treatment on 05/16/14. Ms Vladimir Creeks states pt is no longer living at [facility]. She is residing at [facility] on [address] has this RN's name and number should she need to reach this office for any reason. Ms Alsphaugh verified all the information she was given by this RN, and she verbalized understanding.

## 2014-04-25 LAB — GLUCOSE, CAPILLARY
GLUCOSE-CAPILLARY: 126 mg/dL — AB (ref 70–99)
Glucose-Capillary: 117 mg/dL — ABNORMAL HIGH (ref 70–99)

## 2014-04-27 LAB — CULTURE, BLOOD (ROUTINE X 2)
Culture: NO GROWTH
Culture: NO GROWTH

## 2014-04-28 ENCOUNTER — Encounter: Payer: Self-pay | Admitting: Adult Health

## 2014-04-28 ENCOUNTER — Telehealth: Payer: Self-pay | Admitting: *Deleted

## 2014-04-28 ENCOUNTER — Non-Acute Institutional Stay (SKILLED_NURSING_FACILITY): Payer: Medicare Other | Admitting: Adult Health

## 2014-04-28 DIAGNOSIS — E1159 Type 2 diabetes mellitus with other circulatory complications: Secondary | ICD-10-CM

## 2014-04-28 DIAGNOSIS — Z950 Presence of cardiac pacemaker: Secondary | ICD-10-CM

## 2014-04-28 DIAGNOSIS — I2510 Atherosclerotic heart disease of native coronary artery without angina pectoris: Secondary | ICD-10-CM | POA: Diagnosis not present

## 2014-04-28 DIAGNOSIS — I509 Heart failure, unspecified: Secondary | ICD-10-CM

## 2014-04-28 DIAGNOSIS — I10 Essential (primary) hypertension: Secondary | ICD-10-CM

## 2014-04-28 DIAGNOSIS — N179 Acute kidney failure, unspecified: Secondary | ICD-10-CM | POA: Diagnosis not present

## 2014-04-28 DIAGNOSIS — E785 Hyperlipidemia, unspecified: Secondary | ICD-10-CM

## 2014-04-28 DIAGNOSIS — R55 Syncope and collapse: Secondary | ICD-10-CM

## 2014-04-28 DIAGNOSIS — E039 Hypothyroidism, unspecified: Secondary | ICD-10-CM

## 2014-04-28 DIAGNOSIS — R432 Parageusia: Secondary | ICD-10-CM | POA: Diagnosis not present

## 2014-04-28 DIAGNOSIS — E1151 Type 2 diabetes mellitus with diabetic peripheral angiopathy without gangrene: Secondary | ICD-10-CM | POA: Insufficient documentation

## 2014-04-28 DIAGNOSIS — C44310 Basal cell carcinoma of skin of unspecified parts of face: Secondary | ICD-10-CM | POA: Diagnosis not present

## 2014-04-28 DIAGNOSIS — Z510 Encounter for antineoplastic radiation therapy: Secondary | ICD-10-CM | POA: Diagnosis not present

## 2014-04-28 MED ORDER — INSULIN LISPRO 100 UNIT/ML ~~LOC~~ SOLN: 5.0000 [IU] | Freq: Three times a day (TID) | SUBCUTANEOUS | Status: DC

## 2014-04-28 NOTE — Progress Notes (Signed)
Patient ID: [name], female   DOB: 11/30/1928, 80 y.o.   MRN: [id number]  [name] living [place]     No Known Allergies     Chief Complaint  Patient presents with  . Hospitalization Follow-up    HPI:  She has been hospitalized in the end of Dec 2015 due to skin cancer with mets with to and status post  left parotid parotidectomy status post left carotid ectomy on 03-14-14. She has been recently hospitalized due a syncopal episode due to poor intake. She tends to eat ice cream but nothing else. She is very hard of hearing; is difficult to communicate with at times. She is complaining of back pain; did have pain with movement of her lower extremities. She states that she was living at home prior to her hospitalization. She would like to return back home. I am not certain if this will be a reasonable option for her.   Past Medical History  Diagnosis Date  . CHF (congestive heart failure)   . Coronary artery disease     multiple PCI; echo 09/15/10-EF45-50%, aortic valve sclerosis without stenosis; myoview 03/23/11-no reversible defects, EF66%  . Hypertension   . Pacemaker 09/2010    sinus node dysfunction, sinus bradycardia  . Hyperlipidemia   . Hypothyroidism   . Carotid bruit 10/25/2002    carotid doppler- normal  . Myocardial infarction   . Insulin dependent diabetes mellitus     fasting cbg 160s  . Chronic kidney disease     stage IV renal disease  . 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 Osteopenia   . COPD (chronic obstructive pulmonary disease)   . Stroke about 20 years ago     1998  . Skin cancer     ermoved from left side of face    Past Surgical History  Procedure Laterality Date  . Insert / replace / remove pacemaker  09/21/10    medtronic Adapta   . Coronary angioplasty with stent placement  09/13/10    preserved LV function, PTCA/stenting of the RCA with insertion of two stents, 2.75x25mm stent proximal RCA and 2.25x68mm at the distal RCA, post stent  dilatation up to 2.60mm  . Cardiac catheterization  05/28/10    EF 65%, medical therapy  . Coronary angioplasty with stent placement  06/04/09    PCI, stenting of the mid and prox RCA with Promus DES  . Cardiac catheterization  06/03/09    PCI, stenting of the major oblique marginal arteries off the circ  . Coronary angioplasty with stent placement  10/16/2002    stenting of mid RCA with 2.75x43mm Cypher stent deployed at 16 atmospheres; EF 60  . Coronary angioplasty with stent placement  11/29/2001    2.75x45mm cutting balloon angioplasty of the in stent restenosis of RCA, bracytherapy of the in stend restenotic segment of mid RCA  . Cardiac catheterization  11/28/2001    high grade isten restenosis of RCA stent placed 03/02/01, proceed with cutting balloon 8/28  . Coronary angioplasty with stent placement  03/02/2001    ptca and stenting of RCA (2.75x4mm Ceta) after predilatation with a 2.5x52mm CrossSail and OpenSail balloon; EF 60%  . Tumor removal      on side of neck  . Parotidectomy Left 03/14/2014    Procedure: LEFT PAROTIDECTOMY WITH FACIAL NERVE DISSECTION ;  Surgeon: Rozetta Nunnery, MD;  Location: [place];  Service: ENT;  Laterality: Left;  Total parotidectomy with facial nerve dissection, limited LEFT  nerve dissection, LEFT tarsorrhaphy  . Radical neck dissection Left 03/14/2014    Procedure: LEFT NECK DISSECTION;  Surgeon: Rozetta Nunnery, MD;  Location: [facility] OR;  Service: ENT;  Laterality: Left;    VITAL SIGNS BP 120/60 mmHg  Pulse 80  Ht 5\' 2"  (1.575 m)  Wt 163 lb (73.936 kg)  BMI 29.81 kg/m2   Outpatient Encounter Prescriptions as of 04/28/2014  Medication Sig  . erythromycin ophthalmic ointment Place 1 application into the left eye every 4 (four) hours while awake.   . furosemide (LASIX) 40 MG tablet Take 0.5 tablets (20 mg total) by mouth every other day. (Patient taking differently: Take 20 mg by mouth every other day. )  . gabapentin (NEURONTIN) 300 MG capsule  Take 300 mg by mouth 2 (two) times daily.   Marland Kitchen HYDROcodone-acetaminophen (NORCO/VICODIN) 5-325 MG per tablet Take 1 tablet by mouth every 6 (six) hours as needed for moderate pain.  Marland Kitchen insulin glargine (LANTUS) 100 UNIT/ML injection Inject 0.14 mLs (14 Units total) into the skin at bedtime.  . isosorbide mononitrate (IMDUR) 30 MG 24 hr tablet Take 1 tablet (30 mg total) by mouth daily.  Marland Kitchen levothyroxine (SYNTHROID, LEVOTHROID) 25 MCG tablet Take 25 mcg by mouth daily.    Marland Kitchen lisinopril (PRINIVIL,ZESTRIL) 5 MG tablet Take 5 mg by mouth daily.  . metoprolol tartrate (LOPRESSOR) 25 MG tablet Take 25 mg by mouth 2 (two) times daily.    . nitroGLYCERIN (NITROSTAT) 0.4 MG SL tablet Place 1 tablet (0.4 mg total) under the tongue every 5 (five) minutes as needed. Chest pain (Patient taking differently: Place 0.4 mg under the tongue every 5 (five) minutes as needed for chest pain. )  . oxybutynin (DITROPAN-XL) 10 MG 24 hr tablet Take 10 mg by mouth every other day.  . polyvinyl alcohol (LIQUIFILM TEARS) 1.4 % ophthalmic solution Place 2 drops into the left eye 3 (three) times daily.  . simvastatin (ZOCOR) 20 MG tablet Take 20 mg by mouth at bedtime.   . triamcinolone (KENALOG) 0.025 % cream Apply 1 application topically 2 (two) times daily. Apply to legs twice a day for 10 days     SIGNIFICANT DIAGNOSTIC EXAMS  04-21-14: chest x-ray: No acute cardiopulmonary process.  04-21-14: ct of head: Remote infarcts and small vessel disease type changes without CT evidence of large acute infarct.No intracranial hemorrhage.  04-22-14: bilateral lower extremity doppler: No evidence of deep vein thrombosis involving the right lower   extremity. - No evidence of deep vein thrombosis involving the left lower extremity. - No evidence of Baker&'s cyst on the right or left  04-22-14:2-d echo: Left ventricle: The cavity size was normal. Wall thickness was increased in a pattern of mild LVH. Systolic function was normal. The  estimated ejection fraction was in the range of 55% to 60%. Wall motion was normal; there were no regional wall motion abnormalities. Features are consistent with a pseudonormal left ventricular filling pattern, with concomitant abnormal relaxation and increased filling pressure (grade 2 diastolic dysfunction) Doppler parameters are consistent with high ventricular filling pressure. - Mitral valve: Calcified annulus. Mildly thickened leaflets .There was moderate regurgitation. - Left atrium: The atrium was moderately dilated.   LABS REVIEWED:   04-21-14: wbc 10.7; hgb 11.2; hct 34.8; mcv 93.5; plt 236; glucose 143; bun 26; creat 1.7; k+5.6; na++141; tsh 11.498; free t4: 1.07 04-22-14: wbc 9.5; hgb 10.0; hct 31.0; mcv 93.1; plt 223; glucose 97; bun 12; creat 1.3; k+3.7; na++145; liver normal albumin 2.9; hgb  a1c 7.0 04-23-14: wbc 10.4; hgb 9.8; hct 30.1; mcv 92.3 ;plt 226; gluocse 123; bun 19; creat 1.25; k+4.0; na++144;     Review of Systems  Constitutional: Negative for malaise/fatigue.  HENT:       Has left facial numbness and is unable to hear out of left ear   Respiratory: Negative for cough and shortness of breath.   Cardiovascular: Negative for chest pain, palpitations and leg swelling.  Gastrointestinal: Negative for heartburn, abdominal pain and constipation.  Musculoskeletal: Positive for myalgias, back pain and joint pain.       Is having back pain and knee pain.   Skin: Negative.   Psychiatric/Behavioral: Negative for depression. The patient is not nervous/anxious and does not have insomnia.      Physical Exam  Constitutional: No distress.  Overweight   Neck: No JVD present.  Has some swelling present from her left carotid ectomy present    Cardiovascular: Normal rate, regular rhythm and intact distal pulses.   Respiratory: Effort normal and breath sounds normal. No respiratory distress.  GI: Soft. Bowel sounds are normal. She exhibits no distension. There is no tenderness.    Musculoskeletal: She exhibits no edema.  Is able to move all extremities.   Neurological: She is alert.  Skin: Skin is warm and dry. She is not diaphoretic.       ASSESSMENT/ PLAN:  1. CAD: is presently stable without complaints of chest pain presently will continue imdur 30 mg daily and  ntg prn at this time; per her discharge summary she is to be on asa 81 mg daily will restart this; she is status post cardiac stent placement; and status post pace maker placement.  Will monitor   2. CHF: she is presently stable will continue daily weights; lasix 20 mg every other day  3. Diabetes: her cbg's have been elevated; will begin humalog 5 units prior to meals for cbg >=150; will continue lantus 14 units nightly   4. Hypothyroidism: her tsh done in the hospital was 11.498; will continue synthroid 25 mcg daily and will check tsh in 1 to 2 months  5. Hypertension: will continue lisinopril 5 mg daily; lopressor 25 mg twice daily   6. Urinary incontinence: will continue ditropan xl 10 mg daily will monitor her status;   7. Dyslipidemia: will continue zocor 20 mg nightly   8. Facial numbness: status post surgery: will continue neurontin 300 mg twice daily  9. Back pain: will continue vicodin 5/325 mg every 6 hours as needed and will monitor her status.   10. Syncope: felt to related to poor po intake; will not make changes at this time; will continue to encourage her to eat meals; if she falls; has issues again with syncope may need to stop the ditropan.     Time spent with patient 50 minutes.   Ok Edwards NP [facility]  Contact [phone] Monday through Friday 8am- 5pm  After hours call [phone]

## 2014-04-28 NOTE — Telephone Encounter (Signed)
Received call from patient's friend/caregiver [name] this morning.  She reported: Upon DC last week Thursday, patient went to [facility].   General sense on Helen's part based on her phone contact with patient is that her overall wellbeing has declined.  Because she had not been evaluated by an MD upon until well after arrival, she was allocated a walker and had to remain in bed or in a wheelchair.  Lack of mobility has resulted in bilateral leg swelling.  She has been disoriented and confused since arrival though caregiver Colletta Maryland indicated confusion was somewhat abated on Sunday. [name] questioned whether patient should proceed with RT on Wednesday.  I responded that I would inform Dr. Isidore Moos, provide her [facility] practitioner contact information.  Gayleen Orem, RN, BSN, [place] at [place] [phone]

## 2014-04-29 ENCOUNTER — Telehealth: Payer: Self-pay | Admitting: *Deleted

## 2014-04-29 ENCOUNTER — Other Ambulatory Visit: Payer: Self-pay | Admitting: *Deleted

## 2014-04-29 ENCOUNTER — Non-Acute Institutional Stay (SKILLED_NURSING_FACILITY): Payer: Medicare Other | Admitting: Internal Medicine

## 2014-04-29 ENCOUNTER — Encounter: Payer: Self-pay | Admitting: Internal Medicine

## 2014-04-29 DIAGNOSIS — E039 Hypothyroidism, unspecified: Secondary | ICD-10-CM

## 2014-04-29 DIAGNOSIS — C44300 Unspecified malignant neoplasm of skin of unspecified part of face: Secondary | ICD-10-CM

## 2014-04-29 DIAGNOSIS — I10 Essential (primary) hypertension: Secondary | ICD-10-CM

## 2014-04-29 DIAGNOSIS — C44310 Basal cell carcinoma of skin of unspecified parts of face: Secondary | ICD-10-CM

## 2014-04-29 DIAGNOSIS — R55 Syncope and collapse: Secondary | ICD-10-CM

## 2014-04-29 DIAGNOSIS — E1151 Type 2 diabetes mellitus with diabetic peripheral angiopathy without gangrene: Secondary | ICD-10-CM

## 2014-04-29 DIAGNOSIS — M25579 Pain in unspecified ankle and joints of unspecified foot: Secondary | ICD-10-CM

## 2014-04-29 DIAGNOSIS — I5032 Chronic diastolic (congestive) heart failure: Secondary | ICD-10-CM

## 2014-04-29 DIAGNOSIS — E1159 Type 2 diabetes mellitus with other circulatory complications: Secondary | ICD-10-CM

## 2014-04-29 DIAGNOSIS — T68XXXD Hypothermia, subsequent encounter: Secondary | ICD-10-CM

## 2014-04-29 MED ORDER — HYDROCODONE-ACETAMINOPHEN 5-325 MG PO TABS: 1.0000 | ORAL_TABLET | Freq: Four times a day (QID) | ORAL | Status: DC | PRN

## 2014-04-29 NOTE — Telephone Encounter (Signed)
[facility] 

## 2014-04-29 NOTE — Telephone Encounter (Signed)
Received call from patient's friend, [name].  She reported that patient can be transported to [facility] by [facility] for tomorrow's appt, that she plans to meet patient at [facility].  Per Dr. Isidore Moos, I shared that she spoke with patient's MD at [facility] who indicated patient can proceed with RT.    Gayleen Orem, RN, BSN, [place] at [place] [phone]

## 2014-04-29 NOTE — Progress Notes (Signed)
Patient ID: [name], female   DOB: 1928-08-18, 81 y.o.   MRN: [id number]    HISTORY AND PHYSICAL  Location:  [facility] of Service: SNF [phone])   Extended Emergency Contact Information Primary Emergency Contact: [place], [place] [address] Johnnette Litter of [place] Phone: [phone] Mobile Phone: [phone] Relation: Friend Secondary Emergency Contact: Gwyndolyn Saxon [place] of [place] Phone: [phone] Relation: Other  Advanced Directive information  DNR  Chief Complaint  Patient presents with  . New Admit To SNF    syncope, hypothermia, skin cancer, chronic CHF, hypothyroidism, hx bradycardia s/p pacer, DM II insulin dependent, OAB, CAD s/p stent    HPI:  81 yo female seen today as a new admission to SNF for above. She c/o pain in her feet and she is unable to stand due to the pain. Thinks she needs vicodin. She has appt to start XRT, Dr Lanell Persons ([place]) due to skin cancer of face tomorrow. She has no other concerns. She also will f/u with cardiology Dr Lenn Cal soon. She reports no low BS reactions. CBGs 190-280s. A1c on admission to hospital 7%. Her appetite is ok and she is sleeping well. Family is present today   Past Medical History  Diagnosis Date  . CHF (congestive heart failure)   . Coronary artery disease     multiple PCI; echo 09/15/10-EF45-50%, aortic valve sclerosis without stenosis; myoview 03/23/11-no reversible defects, EF66%  . Hypertension   . Pacemaker 09/2010    sinus node dysfunction, sinus bradycardia  . Hyperlipidemia   . Hypothyroidism   . Carotid bruit 10/25/2002    carotid doppler- normal  . Myocardial infarction   . Insulin dependent diabetes mellitus     fasting cbg 160s  . Chronic kidney disease     stage IV renal disease  . 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 Osteopenia   . COPD (chronic obstructive pulmonary disease)   . Stroke about 20 years ago     1998    . Skin cancer     ermoved from left side of face    Past Surgical History  Procedure Laterality Date  . Insert / replace / remove pacemaker  09/21/10    medtronic Adapta   . Coronary angioplasty with stent placement  09/13/10    preserved LV function, PTCA/stenting of the RCA with insertion of two stents, 2.75x32m stent proximal RCA and 2.25x232mat the distal RCA, post stent dilatation up to 2.25m34m. Cardiac catheterization  05/28/10    EF 65%, medical therapy  . Coronary angioplasty with stent placement  06/04/09    PCI, stenting of the mid and prox RCA with Promus DES  . Cardiac catheterization  06/03/09    PCI, stenting of the major oblique marginal arteries off the circ  . Coronary angioplasty with stent placement  10/16/2002    stenting of mid RCA with 2.75x13m15mpher stent deployed at 16 atmospheres; EF 60  . Coronary angioplasty with stent placement  11/29/2001    2.75x10mm61mting balloon angioplasty of the in stent restenosis of RCA, bracytherapy of the in stend restenotic segment of mid RCA  . Cardiac catheterization  11/28/2001    high grade isten restenosis of RCA stent placed 03/02/01, proceed with cutting balloon 8/28  . Coronary angioplasty with stent placement  03/02/2001    ptca and stenting of RCA (2.75x23mm 40m) after predilatation  with a 2.5x75m CrossSail and OpenSail balloon; EF 60%  . Tumor removal      on side of neck  . Parotidectomy Left 03/14/2014    Procedure: LEFT PAROTIDECTOMY WITH FACIAL NERVE DISSECTION ;  Surgeon: CRozetta Nunnery MD;  [facility]  Service: ENT;  Laterality: Left;  Total parotidectomy with facial nerve dissection, limited LEFT nerve dissection, LEFT tarsorrhaphy  . Radical neck dissection Left 03/14/2014    Procedure: LEFT NECK DISSECTION;  Surgeon: CRozetta Nunnery MD;  Location: [facility]  Service: ENT;  Laterality: Left;    Patient Care Team: HLajean Manes MD as PCP - General (Internal Medicine) SEppie Gibson MD as  Attending Physician (Radiation Oncology) RBrooks Sailors RN as Oncology Nurse Navigator  History   Social History  . Marital Status: Single    Spouse Name: N/A    Number of Children: N/A  . Years of Education: N/A   Occupational History  . Not on file.   Social History Main Topics  . Smoking status: Former Smoker -- 0.50 packs/day    Quit date: 03/01/2012  . Smokeless tobacco: Not on file  . Alcohol Use: No  . Drug Use: No  . Sexual Activity: No   Other Topics Concern  . Not on file   Social History Narrative   Patient lives alone at home.   Next of kin and have her power of attorney is MArliss Journeyat phone number listed     reports that she quit smoking about 2 years ago. She does not have any smokeless tobacco history on file. She reports that she does not drink alcohol or use illicit drugs.  Family History  Problem Relation Age of Onset  . Asthma Mother   . Stroke Mother   . Diabetes Father   . Heart failure Father    Family Status  Relation Status Death Age  . Mother Deceased   . Father Deceased   . Sister Deceased   . Sister Deceased      There is no immunization history on file for this patient.  Past medical, surgical, family and social history reviewed  No Known Allergies  Medications: Patient's Medications  New Prescriptions   No medications on file  Previous Medications   ERYTHROMYCIN OPHTHALMIC OINTMENT    Place 1 application into the left eye every 4 (four) hours while awake.    FUROSEMIDE (LASIX) 40 MG TABLET    Take 0.5 tablets (20 mg total) by mouth every other day.   GABAPENTIN (NEURONTIN) 300 MG CAPSULE    Take 300 mg by mouth 2 (two) times daily.    INSULIN GLARGINE (LANTUS) 100 UNIT/ML INJECTION    Inject 0.14 mLs (14 Units total) into the skin at bedtime.   INSULIN LISPRO (HUMALOG) 100 UNIT/ML INJECTION    Inject 0.05 mLs (5 Units total) into the skin 3 (three) times daily before meals. For cbg >=150   ISOSORBIDE MONONITRATE  (IMDUR) 30 MG 24 HR TABLET    Take 1 tablet (30 mg total) by mouth daily.   LEVOTHYROXINE (SYNTHROID, LEVOTHROID) 25 MCG TABLET    Take 25 mcg by mouth daily.     LISINOPRIL (PRINIVIL,ZESTRIL) 5 MG TABLET    Take 5 mg by mouth daily.   METOPROLOL TARTRATE (LOPRESSOR) 25 MG TABLET    Take 25 mg by mouth 2 (two) times daily.     NITROGLYCERIN (NITROSTAT) 0.4 MG SL TABLET    Place 1 tablet (0.4 mg total) under  the tongue every 5 (five) minutes as needed. Chest pain   OXYBUTYNIN (DITROPAN-XL) 10 MG 24 HR TABLET    Take 10 mg by mouth every other day.   POLYVINYL ALCOHOL (LIQUIFILM TEARS) 1.4 % OPHTHALMIC SOLUTION    Place 2 drops into the left eye 3 (three) times daily.   SIMVASTATIN (ZOCOR) 20 MG TABLET    Take 20 mg by mouth at bedtime.    TRIAMCINOLONE (KENALOG) 0.025 % CREAM    Apply 1 application topically 2 (two) times daily. Apply to legs twice a day for 10 days  Modified Medications   Modified Medication Previous Medication   HYDROCODONE-ACETAMINOPHEN (NORCO/VICODIN) 5-325 MG PER TABLET HYDROcodone-acetaminophen (NORCO/VICODIN) 5-325 MG per tablet      Take one tablet by mouth every 6 hours as needed for pain. Not to exceed 3047m of APAP from all sources/24h    Take one tablet by mouth every 6 hours as needed for pain. Not to exceed 30056mof APAP from all sources/24h  Discontinued Medications   HYDROCODONE-ACETAMINOPHEN (NORCO/VICODIN) 5-325 MG PER TABLET    Take 1 tablet by mouth every 6 (six) hours as needed for moderate pain. Not to exceed 300048mf APAP from all sources/24h    Review of Systems  Constitutional: Negative for fever, chills, diaphoresis, activity change, appetite change and fatigue.  HENT: Negative for ear pain and sore throat.   Eyes: Negative for visual disturbance.  Respiratory: Negative for cough, chest tightness and shortness of breath.   Cardiovascular: Negative for chest pain, palpitations and leg swelling.  Gastrointestinal: Negative for nausea, vomiting,  abdominal pain, diarrhea, constipation and blood in stool.  Genitourinary: Negative for dysuria.  Musculoskeletal: Positive for arthralgias.  Neurological: Positive for weakness. Negative for dizziness, tremors, numbness and headaches.  Psychiatric/Behavioral: Negative for sleep disturbance. The patient is not nervous/anxious.        Filed Vitals:   04/29/14 1702  BP: 96/47  Pulse: 66  Temp: 97.5 F (36.4 C)   There is no weight on file to calculate BMI.  Physical Exam CONSTITUTIONAL: Looks frail in NAD. Awake, alert and oriented x 3 HEENT: PERRLA on OD. OS lid closed. No scleral icterus. Oropharynx clear and without exudate. MMM NECK: Supple. Nontender. No palpable cervical or supraclavicular lymph nodes. No carotid bruit b/l. No thyromegaly or thyroid mass palpable.  CVS: Regular rate with 1/6 SEmurmur. No gallop or rub. LUNGS: CTA b/l no wheezing, rales or rhonchi. ABDOMEN: Bowel sounds present x 4. Soft, nontender, nondistended. No palpable mass or bruit EXTREMITIES: No edema b/l. Distal pulses palpable. No calf tenderness. Foot exam reveals hammertoes b/l. Monofilament test intact b/l. Pre-ulcerative calluses noted on plantar surface at 1st MTP joint.  PSYCH: Affect, behavior and mood normal   Labs reviewed: Admission on 04/21/2014, Discharged on 04/23/2014  Component Date Value Ref Range Status  . Lactic Acid, Venous 04/21/2014 1.19  0.5 - 2.2 mmol/L Final  . Specimen Description 04/21/2014 URINE, CATHETERIZED   Final  . Special Requests 04/21/2014 NONE   Final  . Colony Count 04/21/2014    Final                   Value:NO GROWTH Performed at [facility]. Culture 04/21/2014    Final                   Value:NO GROWTH Performed at [facility]. Report Status 04/21/2014 04/22/2014 FINAL   Final  . WBC  04/21/2014 10.7* 4.0 - 10.5 K/uL Final  . RBC 04/21/2014 3.72* 3.87 - 5.11 MIL/uL Final  . Hemoglobin 04/21/2014 11.2* 12.0 - 15.0 g/dL Final  .  HCT 04/21/2014 34.8* 36.0 - 46.0 % Final  . MCV 04/21/2014 93.5  78.0 - 100.0 fL Final  . MCH 04/21/2014 30.1  26.0 - 34.0 pg Final  . MCHC 04/21/2014 32.2  30.0 - 36.0 g/dL Final  . RDW 04/21/2014 14.6  11.5 - 15.5 % Final  . Platelets 04/21/2014 236  150 - 400 K/uL Final  . Neutrophils Relative % 04/21/2014 60  43 - 77 % Final  . Neutro Abs 04/21/2014 6.5  1.7 - 7.7 K/uL Final  . Lymphocytes Relative 04/21/2014 23  12 - 46 % Final  . Lymphs Abs 04/21/2014 2.5  0.7 - 4.0 K/uL Final  . Monocytes Relative 04/21/2014 12  3 - 12 % Final  . Monocytes Absolute 04/21/2014 1.3* 0.1 - 1.0 K/uL Final  . Eosinophils Relative 04/21/2014 4  0 - 5 % Final  . Eosinophils Absolute 04/21/2014 0.4  0.0 - 0.7 K/uL Final  . Basophils Relative 04/21/2014 1  0 - 1 % Final  . Basophils Absolute 04/21/2014 0.1  0.0 - 0.1 K/uL Final  . Sodium 04/21/2014 141  135 - 145 mmol/L Final   Please note change in reference range.  . Potassium 04/21/2014 5.6* 3.5 - 5.1 mmol/L Final   Please note change in reference range.  . Chloride 04/21/2014 107  96 - 112 mEq/L Final  . CO2 04/21/2014 26  19 - 32 mmol/L Final  . Glucose, Bld 04/21/2014 143* 70 - 99 mg/dL Final  . BUN 04/21/2014 26* 6 - 23 mg/dL Final  . Creatinine, Ser 04/21/2014 1.71* 0.50 - 1.10 mg/dL Final  . Calcium 04/21/2014 9.1  8.4 - 10.5 mg/dL Final  . GFR calc non Af Amer 04/21/2014 26* >90 mL/min Final  . GFR calc Af Amer 04/21/2014 31* >90 mL/min Final   Comment: (NOTE) The eGFR has been calculated using the CKD EPI equation. This calculation has not been validated in all clinical situations. eGFR's persistently <90 mL/min signify possible Chronic Kidney Disease.   . Anion gap 04/21/2014 8  5 - 15 Final  . Color, Urine 04/21/2014 YELLOW  YELLOW Final  . APPearance 04/21/2014 CLEAR  CLEAR Final  . Specific Gravity, Urine 04/21/2014 1.011  1.005 - 1.030 Final  . pH 04/21/2014 7.5  5.0 - 8.0 Final  . Glucose, UA 04/21/2014 NEGATIVE  NEGATIVE mg/dL  Final  . Hgb urine dipstick 04/21/2014 NEGATIVE  NEGATIVE Final  . Bilirubin Urine 04/21/2014 NEGATIVE  NEGATIVE Final  . Ketones, ur 04/21/2014 NEGATIVE  NEGATIVE mg/dL Final  . Protein, ur 04/21/2014 NEGATIVE  NEGATIVE mg/dL Final  . Urobilinogen, UA 04/21/2014 0.2  0.0 - 1.0 mg/dL Final  . Nitrite 04/21/2014 NEGATIVE  NEGATIVE Final  . Leukocytes, UA 04/21/2014 NEGATIVE  NEGATIVE Final   MICROSCOPIC NOT DONE ON URINES WITH NEGATIVE PROTEIN, BLOOD, LEUKOCYTES, NITRITE, OR GLUCOSE <1000 mg/dL.  Marland Kitchen Specimen Description 04/21/2014 BLOOD RIGHT ANTECUBITAL   Final  . Special Requests 04/21/2014 BOTTLES DRAWN AEROBIC AND ANAEROBIC 3 CC EACH   Final  . Culture 04/21/2014    Final                   Value:NO GROWTH 5 DAYS Performed at [facility]   . Report Status 04/21/2014 04/27/2014 FINAL   Final  . Specimen Description 04/21/2014 BLOOD LEFT ANTECUBITAL  Final  . Special Requests 04/21/2014 BOTTLES DRAWN AEROBIC ONLY 3 CC   Final  . Culture 04/21/2014    Final                   Value:NO GROWTH 5 DAYS Performed at [facility]   . Report Status 04/21/2014 04/27/2014 FINAL   Final  . T4, Total 04/21/2014 7.8  4.5 - 12.0 ug/dL Final   Performed at [facility]  . TSH 04/21/2014 11.498* 0.350 - 4.500 uIU/mL Final   Performed at [facility]  . Troponin I 04/21/2014 <0.03  <0.031 ng/mL Final   Comment:        NO INDICATION OF MYOCARDIAL INJURY. Please note change in reference range.   . Sodium 04/21/2014 145  135 - 145 mmol/L Final   Please note change in reference range.  . Potassium 04/21/2014 3.9  3.5 - 5.1 mmol/L Final   Comment: Please note change in reference range. DELTA CHECK NOTED REPEATED TO VERIFY SLIGHT HEMOLYSIS   . Chloride 04/21/2014 112  96 - 112 mEq/L Final  . CO2 04/21/2014 25  19 - 32 mmol/L Final  . Glucose, Bld 04/21/2014 136* 70 - 99 mg/dL Final  . BUN 04/21/2014 24* 6 - 23 mg/dL Final  . Creatinine, Ser 04/21/2014 1.53* 0.50 -  1.10 mg/dL Final  . Calcium 04/21/2014 8.7  8.4 - 10.5 mg/dL Final  . GFR calc non Af Amer 04/21/2014 30* >90 mL/min Final  . GFR calc Af Amer 04/21/2014 35* >90 mL/min Final   Comment: (NOTE) The eGFR has been calculated using the CKD EPI equation. This calculation has not been validated in all clinical situations. eGFR's persistently <90 mL/min signify possible Chronic Kidney Disease.   . Anion gap 04/21/2014 8  5 - 15 Final  . Free T4 04/21/2014 1.07  0.80 - 1.80 ng/dL Final   Performed at [facility]  . Cortisol - AM 04/22/2014 11.8  4.3 - 22.4 ug/dL Final   Performed at [facility]  . Sodium 04/22/2014 145  135 - 145 mmol/L Final   Please note change in reference range.  . Potassium 04/22/2014 3.7  3.5 - 5.1 mmol/L Final   Please note change in reference range.  . Chloride 04/22/2014 114* 96 - 112 mEq/L Final  . CO2 04/22/2014 24  19 - 32 mmol/L Final  . Glucose, Bld 04/22/2014 97  70 - 99 mg/dL Final  . BUN 04/22/2014 22  6 - 23 mg/dL Final  . Creatinine, Ser 04/22/2014 1.31* 0.50 - 1.10 mg/dL Final  . Calcium 04/22/2014 8.3* 8.4 - 10.5 mg/dL Final  . Total Protein 04/22/2014 5.5* 6.0 - 8.3 g/dL Final  . Albumin 04/22/2014 2.9* 3.5 - 5.2 g/dL Final  . AST 04/22/2014 13  0 - 37 U/L Final  . ALT 04/22/2014 10  0 - 35 U/L Final  . Alkaline Phosphatase 04/22/2014 70  39 - 117 U/L Final  . Total Bilirubin 04/22/2014 0.8  0.3 - 1.2 mg/dL Final  . GFR calc non Af Amer 04/22/2014 37* >90 mL/min Final  . GFR calc Af Amer 04/22/2014 42* >90 mL/min Final   Comment: (NOTE) The eGFR has been calculated using the CKD EPI equation. This calculation has not been validated in all clinical situations. eGFR's persistently <90 mL/min signify possible Chronic Kidney Disease.   . Anion gap 04/22/2014 7  5 - 15 Final  . WBC 04/22/2014 9.5  4.0 - 10.5 K/uL Final  .  RBC 04/22/2014 3.33* 3.87 - 5.11 MIL/uL Final  . Hemoglobin 04/22/2014 10.0* 12.0 - 15.0 g/dL Final  . HCT  04/22/2014 31.0* 36.0 - 46.0 % Final  . MCV 04/22/2014 93.1  78.0 - 100.0 fL Final  . MCH 04/22/2014 30.0  26.0 - 34.0 pg Final  . MCHC 04/22/2014 32.3  30.0 - 36.0 g/dL Final  . RDW 04/22/2014 14.7  11.5 - 15.5 % Final  . Platelets 04/22/2014 223  150 - 400 K/uL Final  . Troponin I 04/21/2014 <0.03  <0.031 ng/mL Final   Comment:        NO INDICATION OF MYOCARDIAL INJURY. Please note change in reference range.   . Troponin I 04/21/2014 <0.03  <0.031 ng/mL Final   Comment:        NO INDICATION OF MYOCARDIAL INJURY. Please note change in reference range.   . Troponin I 04/22/2014 <0.03  <0.031 ng/mL Final   Comment:        NO INDICATION OF MYOCARDIAL INJURY. Please note change in reference range.   . Hgb A1c MFr Bld 04/22/2014 7.0* <5.7 % Final   Comment: (NOTE)                                                                       According to the ADA Clinical Practice Recommendations for 2011, when HbA1c is used as a screening test:  >=6.5%   Diagnostic of Diabetes Mellitus           (if abnormal result is confirmed) 5.7-6.4%   Increased risk of developing Diabetes Mellitus References:Diagnosis and Classification of Diabetes Mellitus,Diabetes SPQZ,3007,62(UQJFH 1):S62-S69 and Standards of Medical Care in         Diabetes - 2011,Diabetes LKTG,2563,89 (Suppl 1):S11-S61.   . Mean Plasma Glucose 04/22/2014 154* <117 mg/dL Final   Performed at [facility]  . MRSA by PCR 04/21/2014 NEGATIVE  NEGATIVE Final   Comment:        The GeneXpert MRSA Assay (FDA approved for NASAL specimens only), is one component of a comprehensive MRSA colonization surveillance program. It is not intended to diagnose MRSA infection nor to guide or monitor treatment for MRSA infections.   . Glucose-Capillary 04/21/2014 172* 70 - 99 mg/dL Final  . Glucose-Capillary 04/22/2014 85  70 - 99 mg/dL Final  . Color, Urine 04/22/2014 YELLOW  YELLOW Final  . APPearance 04/22/2014 CLEAR  CLEAR  Final  . Specific Gravity, Urine 04/22/2014 1.015  1.005 - 1.030 Final  . pH 04/22/2014 7.5  5.0 - 8.0 Final  . Glucose, UA 04/22/2014 NEGATIVE  NEGATIVE mg/dL Final  . Hgb urine dipstick 04/22/2014 LARGE* NEGATIVE Final  . Bilirubin Urine 04/22/2014 NEGATIVE  NEGATIVE Final  . Ketones, ur 04/22/2014 NEGATIVE  NEGATIVE mg/dL Final  . Protein, ur 04/22/2014 NEGATIVE  NEGATIVE mg/dL Final  . Urobilinogen, UA 04/22/2014 0.2  0.0 - 1.0 mg/dL Final  . Nitrite 04/22/2014 NEGATIVE  NEGATIVE Final  . Leukocytes, UA 04/22/2014 TRACE* NEGATIVE Final  . Glucose-Capillary 04/22/2014 125* 70 - 99 mg/dL Final  . Squamous Epithelial / LPF 04/22/2014 RARE  RARE Final  . WBC, UA 04/22/2014 0-2  <3 WBC/hpf Final  . RBC / HPF 04/22/2014 TOO NUMEROUS TO COUNT  <3 RBC/hpf Final  .  Bacteria, UA 04/22/2014 RARE  RARE Final  . WBC 04/23/2014 10.4  4.0 - 10.5 K/uL Final   WHITE COUNT CONFIRMED ON SMEAR  . RBC 04/23/2014 3.26* 3.87 - 5.11 MIL/uL Final  . Hemoglobin 04/23/2014 9.8* 12.0 - 15.0 g/dL Final  . HCT 04/23/2014 30.1* 36.0 - 46.0 % Final  . MCV 04/23/2014 92.3  78.0 - 100.0 fL Final  . MCH 04/23/2014 30.1  26.0 - 34.0 pg Final  . MCHC 04/23/2014 32.6  30.0 - 36.0 g/dL Final  . RDW 04/23/2014 14.7  11.5 - 15.5 % Final  . Platelets 04/23/2014 226  150 - 400 K/uL Final  . Sodium 04/23/2014 144  135 - 145 mmol/L Final   Please note change in reference range.  . Potassium 04/23/2014 4.0  3.5 - 5.1 mmol/L Final   Please note change in reference range.  . Chloride 04/23/2014 112  96 - 112 mEq/L Final  . CO2 04/23/2014 24  19 - 32 mmol/L Final  . Glucose, Bld 04/23/2014 123* 70 - 99 mg/dL Final  . BUN 04/23/2014 19  6 - 23 mg/dL Final  . Creatinine, Ser 04/23/2014 1.25* 0.50 - 1.10 mg/dL Final  . Calcium 04/23/2014 8.9  8.4 - 10.5 mg/dL Final  . GFR calc non Af Amer 04/23/2014 39* >90 mL/min Final  . GFR calc Af Amer 04/23/2014 45* >90 mL/min Final   Comment: (NOTE) The eGFR has been calculated using  the CKD EPI equation. This calculation has not been validated in all clinical situations. eGFR's persistently <90 mL/min signify possible Chronic Kidney Disease.   . Anion gap 04/23/2014 8  5 - 15 Final  . Glucose-Capillary 04/22/2014 121* 70 - 99 mg/dL Final  . Glucose-Capillary 04/22/2014 112* 70 - 99 mg/dL Final  . Glucose-Capillary 04/23/2014 126* 70 - 99 mg/dL Final  . Glucose-Capillary 04/23/2014 117* 70 - 99 mg/dL Final  Admission on 03/14/2014, Discharged on 03/18/2014  Component Date Value Ref Range Status  . Glucose-Capillary 03/14/2014 149* 70 - 99 mg/dL Final  . Glucose-Capillary 03/14/2014 195* 70 - 99 mg/dL Final  . Comment 1 03/14/2014 Notify RN   Final  . Comment 2 03/14/2014 Documented in Chart   Final  . Glucose-Capillary 03/14/2014 236* 70 - 99 mg/dL Final  . Glucose-Capillary 03/14/2014 341* 70 - 99 mg/dL Final  . Comment 1 03/14/2014 Notify RN   Final  . Glucose-Capillary 03/15/2014 283* 70 - 99 mg/dL Final  . Comment 1 03/15/2014 Notify RN   Final  . Glucose-Capillary 03/15/2014 265* 70 - 99 mg/dL Final  . Comment 1 03/15/2014 Notify RN   Final  . Glucose-Capillary 03/15/2014 271* 70 - 99 mg/dL Final  . Comment 1 03/15/2014 Notify RN   Final  . Glucose-Capillary 03/15/2014 315* 70 - 99 mg/dL Final  . Comment 1 03/15/2014 Notify RN   Final  . Glucose-Capillary 03/16/2014 145* 70 - 99 mg/dL Final  . Glucose-Capillary 03/16/2014 211* 70 - 99 mg/dL Final  . Glucose-Capillary 03/16/2014 121* 70 - 99 mg/dL Final  . Glucose-Capillary 03/16/2014 153* 70 - 99 mg/dL Final  . Comment 1 03/16/2014 Notify RN   Final  . Comment 2 03/16/2014 Documented in Chart   Final  . Glucose-Capillary 03/17/2014 123* 70 - 99 mg/dL Final  . Glucose-Capillary 03/17/2014 176* 70 - 99 mg/dL Final  . Glucose-Capillary 03/17/2014 150* 70 - 99 mg/dL Final  . Comment 1 03/17/2014 Notify RN   Final  . Comment 2 03/17/2014 Documented in Chart   Final  .  Glucose-Capillary 03/18/2014 70  70 - 99  mg/dL Final  . Glucose-Capillary 03/18/2014 166* 70 - 99 mg/dL Final  Hospital Outpatient Visit on 03/13/2014  Component Date Value Ref Range Status  . WBC 03/13/2014 12.7* 4.0 - 10.5 K/uL Final  . RBC 03/13/2014 4.61  3.87 - 5.11 MIL/uL Final  . Hemoglobin 03/13/2014 14.1  12.0 - 15.0 g/dL Final  . HCT 03/13/2014 43.0  36.0 - 46.0 % Final  . MCV 03/13/2014 93.3  78.0 - 100.0 fL Final  . MCH 03/13/2014 30.6  26.0 - 34.0 pg Final  . MCHC 03/13/2014 32.8  30.0 - 36.0 g/dL Final  . RDW 03/13/2014 14.4  11.5 - 15.5 % Final  . Platelets 03/13/2014 326  150 - 400 K/uL Final  . Sodium 03/13/2014 137  137 - 147 mEq/L Final  . Potassium 03/13/2014 5.0  3.7 - 5.3 mEq/L Final  . Chloride 03/13/2014 100  96 - 112 mEq/L Final  . CO2 03/13/2014 21  19 - 32 mEq/L Final  . Glucose, Bld 03/13/2014 323* 70 - 99 mg/dL Final  . BUN 03/13/2014 43* 6 - 23 mg/dL Final  . Creatinine, Ser 03/13/2014 1.72* 0.50 - 1.10 mg/dL Final  . Calcium 03/13/2014 10.2  8.4 - 10.5 mg/dL Final  . GFR calc non Af Amer 03/13/2014 26* >90 mL/min Final  . GFR calc Af Amer 03/13/2014 30* >90 mL/min Final   Comment: (NOTE) The eGFR has been calculated using the CKD EPI equation. This calculation has not been validated in all clinical situations. eGFR's persistently <90 mL/min signify possible Chronic Kidney Disease.   . Anion gap 03/13/2014 16* 5 - 15 Final  Clinical Support on 02/20/2014  Component Date Value Ref Range Status  . Date Time Interrogation Session 03/17/2014 [id number]   Final  . Pulse Generator Manufacturer 03/17/2014 Medtronic   Final  . Pulse Gen Model 03/17/2014 ADDRL1 Adapta   Final  . Pulse Gen Serial Number 03/17/2014 [id number] H   Final  . RV Sense Sensitivity 03/17/2014 5.6   Final  . RA Pace Amplitude 03/17/2014 2   Final  . RV Pace PulseWidth 03/17/2014 0.4   Final  . RV Pace Amplitude 03/17/2014 2   Final  . RA Impedance 03/17/2014 547   Final  . RA Amplitude 03/17/2014 2.8   Final  . RA  Pacing Amplitude 03/17/2014 0.375   Final  . RA Pacing PulseWidth 03/17/2014 0.4   Final  . RV IMPEDANCE 03/17/2014 567   Final  . RV Amplitude 03/17/2014 16   Final  . RV Pacing Amplitude 03/17/2014 0.75   Final  . RV Pacing PulseWidth 03/17/2014 0.4   Final  . Battery Status 03/17/2014 Unknown   Final  . Battery Longevity 03/17/2014 147   Final  . Battery Voltage 03/17/2014 2.8   Final  . Battery Impedance 03/17/2014 180   Final  . Brady AP VP Percent 03/17/2014 2   Final  . Loletha Grayer AS VP Percent 03/17/2014 1   Final  . Loletha Grayer AP VS Percent 03/17/2014 19   Final  . Loletha Grayer AS VS Percent 03/17/2014 79   Final  . Eval Rhythm 03/17/2014 SR@85    Final  . Miscellaneous Comment 03/17/2014    Final                   Value:Pacemaker remote check. Device function reviewed. Impedance, sensing, auto capture thresholds consistent with previous measurements. Histograms appropriate for patient and level of activity. All other diagnostic data  reviewed and is appropriate and  stable for patient. Real time/magnet EGM shows appropriate sensing and capture. 112 mode switches, max a 174/max V 187bpm, + plavix.  Total burden 1.1%.  11 ventricular high rate episodes the longest 15 seconds, max ventricular rate 202bpm. . Estimated  longevity 12 years. Plan to follow in 3 months remotely, to see in office annually.  Next remote 06/10/14.     Dg Chest 2 View  04/21/2014   CLINICAL DATA:  Patient on antibiotic therapy for ear infection. Altered mental status. History of congestive heart failure.  EXAM: CHEST  2 VIEW  COMPARISON:  03/22/2011  FINDINGS: Multi lead pacer apparatus overlies the left hemi thorax, leads are stable in position. Stable cardiac and mediastinal contours, mildly enlarged. No large consolidative pulmonary opacities. No pleural effusion or pneumothorax. Mid thoracic spine degenerative change.  IMPRESSION: No acute cardiopulmonary process.   Electronically Signed   By: Lovey Newcomer M.D.   On: 04/21/2014  14:09   Ct Head Wo Contrast  04/21/2014   CLINICAL DATA:  81 year old female with syncopal episode today. Current planning radiation therapy for left facial skin cancer. Hypertension. Hyperlipidemia. Pacemaker. Initial encounter.  EXAM: CT HEAD WITHOUT CONTRAST  TECHNIQUE: Contiguous axial images were obtained from the base of the skull through the vertex without intravenous contrast.  COMPARISON:  02/15/2012 head CT.  FINDINGS: No intracranial hemorrhage.  Remote infarcts involving mid left coronal radiata/left lenticular nucleus, right caudate head, right aspect of the pons and right cerebellum. Small vessel disease type changes. No CT evidence of large acute infarct.  Global atrophy without hydrocephalus.  No intracranial mass lesion noted on this unenhanced exam.  Prior left parotid gland resection.  Partial opacification inferior left mastoid air cells.  Vascular calcifications.  IMPRESSION: Remote infarcts and small vessel disease type changes without CT evidence of large acute infarct.  No intracranial hemorrhage.   Electronically Signed   By: Chauncey Cruel M.D.   On: 04/21/2014 17:32   Hospital records reviewed  Assessment/Plan   ICD-9-CM ICD-10-CM   1. Pain in joint, ankle and foot, unspecified laterality 719.47 M25.579   2. Type II diabetes mellitus with peripheral circulatory disorder - adequate control 250.70 E11.59    443.81    3. Skin cancer of face- s/p resection and to start XRT 173.31 C44.310   4. Essential hypertension - controlled 401.9 I10   5. Chronic diastolic CHF (congestive heart failure)- stable 428.32 I50.32    428.0    6. Hypothyroidism, unspecified hypothyroidism type - stable 244.9 E03.9   7. Syncope, unspecified syncope type- resolved 780.2 R55   8. Hypothermia, subsequent encounter- resolved V58.89 T68.XXXD    --f/u with Dr Lanell Persons in AM for XRT. I did s/w Dr Lanell Persons by phone and informed her that Ms Lowrimore is medically stable to begin tx.  --continue pain  control with norco prn  --continue other medications as ordered  --continue PT/OT/ST as tolerated  --f/u with Cardio as scheduled  --reck TSH in 2 weeks  --check A1c in 3 mos  --plan d/w with pt and nursing with understanding. GOAL is short term rehab and d/c home once medically appropriate.  Amanuel Sinkfield S. Perlie Gold  [facility] [address] [place], [place] [address] [phone] Office (Wednesdays and Fridays 8 AM - 5 PM) [phone] Cell (Monday-Friday 8 AM - 5 PM)

## 2014-04-30 ENCOUNTER — Ambulatory Visit
Admission: RE | Admit: 2014-04-30 | Discharge: 2014-04-30 | Disposition: A | Discharge status: Home / Self Care | Payer: Medicare Other | Source: Ambulatory Visit | Attending: Radiation Oncology | Admitting: Radiation Oncology

## 2014-04-30 ENCOUNTER — Encounter: Payer: Self-pay | Admitting: Radiation Oncology

## 2014-04-30 ENCOUNTER — Encounter: Payer: Self-pay | Admitting: *Deleted

## 2014-04-30 VITALS — BP 114/45 | HR 67 | Temp 97.7°F | Resp 20

## 2014-04-30 DIAGNOSIS — C44300 Unspecified malignant neoplasm of skin of unspecified part of face: Secondary | ICD-10-CM

## 2014-04-30 DIAGNOSIS — Z510 Encounter for antineoplastic radiation therapy: Secondary | ICD-10-CM | POA: Diagnosis not present

## 2014-04-30 NOTE — Progress Notes (Signed)
   Weekly Management Note:  Outpatient    ICD-9-CM ICD-10-CM   1. Skin cancer of face 173.31 C44.310     Current Dose:  6 Gy  Projected Dose: 30 Gy   Narrative:  The patient presents for routine under treatment assessment.  CBCT/MVCT images/Port film x-rays were reviewed.  The chart was checked. She is doing well.  Tolerated RT very well. No new complaints today - she is at [facility]  Physical Findings:  Wt Readings from Last 3 Encounters:  04/28/14 163 lb (73.936 kg)  04/22/14 168 lb 14 oz (76.6 kg)  04/09/14 173 lb 14.4 oz (78.881 kg)    oral temperature is 97.7 F (36.5 C). Her blood pressure is 114/45 and her pulse is 67. Her respiration is 20.  NAD, hard of hearing  CBC    Component Value Date/Time   WBC 10.4 04/23/2014 0618   RBC 3.26* 04/23/2014 0618   HGB 9.8* 04/23/2014 0618   HCT 30.1* 04/23/2014 0618   PLT 226 04/23/2014 0618   MCV 92.3 04/23/2014 0618   MCH 30.1 04/23/2014 0618   MCHC 32.6 04/23/2014 0618   RDW 14.7 04/23/2014 0618   LYMPHSABS 2.5 04/21/2014 1244   MONOABS 1.3* 04/21/2014 1244   EOSABS 0.4 04/21/2014 1244   BASOSABS 0.1 04/21/2014 1244     CMP     Component Value Date/Time   NA 144 04/23/2014 0618   K 4.0 04/23/2014 0618   CL 112 04/23/2014 0618   CO2 24 04/23/2014 0618   GLUCOSE 123* 04/23/2014 0618   BUN 19 04/23/2014 0618   CREATININE 1.25* 04/23/2014 0618   CALCIUM 8.9 04/23/2014 0618   PROT 5.5* 04/22/2014 0700   ALBUMIN 2.9* 04/22/2014 0700   AST 13 04/22/2014 0700   ALT 10 04/22/2014 0700   ALKPHOS 70 04/22/2014 0700   BILITOT 0.8 04/22/2014 0700   GFRNONAA 39* 04/23/2014 0618   GFRAA 45* 04/23/2014 0618     Impression:  The patient is tolerating radiotherapy.   Plan:  Continue radiotherapy as planned.   -----------------------------------  Eppie Gibson, MD

## 2014-04-30 NOTE — Progress Notes (Signed)
Patient denies pain, fatigue. She reports loss of appetite. She continues to reside at [facility] center. Per pt's friend, helen, pt "is a picky eater". She prefers ice cream, barbeque and grilled pimento cheese sandwiches. [name] states these foods are not really on her diet.

## 2014-04-30 NOTE — Progress Notes (Signed)
IMRT Device Note Outpatient    ICD-9-CM ICD-10-CM   1. Skin cancer of face 173.31 C44.310     9.0 delivered field widths represent one set of IMRT treatment devices. The code is [phone].  -----------------------------------  Eppie Gibson, MD

## 2014-04-30 NOTE — Progress Notes (Signed)
To provide support and encouragement, care continuity and to assess for needs, met with patient during [facility] Tomo and afterwards during Weekly UT with Dr. Squire.    She was accompanied by her friend Helen.  She tolerated treatment well especially in context of 15 minute tmt.  Helen discussed the need to address patient's return to [facility] following her care at [facility]. I will continue to follow closely.  Rick Diehl, RN, BSN, CHPN Head & Neck Oncology Navigator [facility] at [place] [phone]  

## 2014-05-01 ENCOUNTER — Telehealth: Payer: Self-pay | Admitting: Cardiovascular Disease

## 2014-05-01 ENCOUNTER — Other Ambulatory Visit: Payer: Self-pay | Admitting: *Deleted

## 2014-05-01 MED ORDER — HYDROCODONE-ACETAMINOPHEN 5-325 MG PO TABS: ORAL_TABLET | ORAL | Status: DC

## 2014-05-01 NOTE — Addendum Note (Signed)
Encounter addended by: Andria Rhein, RN on: 05/01/2014  7:36 AM<BR>     Documentation filed: Notes Section, Inpatient Patient Education

## 2014-05-01 NOTE — Telephone Encounter (Signed)
Received records from [facility] for appointment on 06/02/14 with Dr Sallyanne Kuster.  Records given to [facility] (medical records) for Dr Croitoru's schedule on 06/02/14.  lp

## 2014-05-01 NOTE — Progress Notes (Signed)
Discussed patient teaching for [name] with Dr Isidore Moos. Per Dr Isidore Moos, patient may only experience some skin irritation/changes in her treatment area. Per Dr Isidore Moos, will continue to evaluate patient for this temporary side effect and give lotion as needed.

## 2014-05-01 NOTE — Telephone Encounter (Signed)
[facility] 

## 2014-05-02 ENCOUNTER — Ambulatory Visit: Payer: Medicare Other

## 2014-05-05 ENCOUNTER — Ambulatory Visit
Admission: RE | Admit: 2014-05-05 | Discharge: 2014-05-05 | Disposition: A | Discharge status: Home / Self Care | Payer: Medicare Other | Source: Ambulatory Visit | Attending: Radiation Oncology | Admitting: Radiation Oncology

## 2014-05-05 ENCOUNTER — Encounter: Payer: Self-pay | Admitting: *Deleted

## 2014-05-05 DIAGNOSIS — Z510 Encounter for antineoplastic radiation therapy: Secondary | ICD-10-CM | POA: Diagnosis not present

## 2014-05-06 ENCOUNTER — Other Ambulatory Visit: Payer: Self-pay | Admitting: *Deleted

## 2014-05-06 MED ORDER — HYDROCODONE-ACETAMINOPHEN 5-325 MG PO TABS: ORAL_TABLET | ORAL | Status: DC

## 2014-05-06 NOTE — Progress Notes (Signed)
To provide support and encouragement, care continuity and to assess for needs, met with patient during her Tomo tmt, assisted therapists.  She was accompanied by her friend [name]. 1. She tolerated CT and 15 minute tmt exceptionally well, without any movement.  "I didn't move a muscle."  Therapists and I recognized her success.  2. She understands she will see Dr. Isidore Moos on Friday. 3. I escorted them back to [facility] lobby where patient waited for return ride to [facility]. 4. I will continue to monitor closely.  Gayleen Orem, RN, BSN, [place] at [place] [phone]

## 2014-05-06 NOTE — Telephone Encounter (Signed)
[facility] 

## 2014-05-07 ENCOUNTER — Ambulatory Visit: Payer: Medicare Other

## 2014-05-09 ENCOUNTER — Encounter: Payer: Self-pay | Admitting: *Deleted

## 2014-05-09 ENCOUNTER — Ambulatory Visit
Admission: RE | Admit: 2014-05-09 | Discharge: 2014-05-09 | Disposition: A | Discharge status: Home / Self Care | Payer: Medicare Other | Source: Ambulatory Visit | Attending: Radiation Oncology | Admitting: Radiation Oncology

## 2014-05-09 ENCOUNTER — Ambulatory Visit: Payer: Medicare Other | Admitting: Radiation Oncology

## 2014-05-09 DIAGNOSIS — Z510 Encounter for antineoplastic radiation therapy: Secondary | ICD-10-CM | POA: Diagnosis not present

## 2014-05-09 NOTE — Progress Notes (Signed)
To provide support and encouragement, care continuity and to assess for needs, met with patient prior to her Tomo tmt.  Gayleen Orem, RN, BSN, [employer] at [place] [phone]

## 2014-05-12 ENCOUNTER — Ambulatory Visit
Admission: RE | Admit: 2014-05-12 | Discharge: 2014-05-12 | Disposition: A | Discharge status: Home / Self Care | Payer: Medicare Other | Source: Ambulatory Visit | Attending: Radiation Oncology | Admitting: Radiation Oncology

## 2014-05-12 ENCOUNTER — Encounter: Payer: Self-pay | Admitting: *Deleted

## 2014-05-12 ENCOUNTER — Ambulatory Visit: Admission: RE | Admit: 2014-05-12 | Payer: Medicare Other | Source: Ambulatory Visit | Admitting: Radiation Oncology

## 2014-05-12 ENCOUNTER — Telehealth: Payer: Self-pay | Admitting: *Deleted

## 2014-05-12 VITALS — BP 119/66 | HR 84 | Temp 97.5°F | Resp 20

## 2014-05-12 DIAGNOSIS — Z510 Encounter for antineoplastic radiation therapy: Secondary | ICD-10-CM | POA: Diagnosis not present

## 2014-05-12 DIAGNOSIS — C44300 Unspecified malignant neoplasm of skin of unspecified part of face: Secondary | ICD-10-CM

## 2014-05-12 MED ORDER — LIDOCAINE VISCOUS 2 % MT SOLN: OROMUCOSAL | Status: AC

## 2014-05-12 NOTE — Progress Notes (Signed)
   Weekly Management Note:  Outpatient    ICD-9-CM ICD-10-CM   1. Skin cancer of face 173.31 C44.310 lidocaine (XYLOCAINE) 2 % solution    Current Dose:  24 Gy  Projected Dose: 30 Gy   Narrative:  The patient presents for routine under treatment assessment.  CBCT/MVCT images/Port film x-rays were reviewed.  The chart was checked. She is doing well.  Tolerated RT very well. No new complaints today - she is still  at [facility]. Patient denies pain. She is drinking protein chocolate drink but states the food where she is staying "is not good". Her friend/transpoter states pt is eating very little but will drink these chocolate drinks. No skin changes in neck treatment area.   Physical Findings:  Wt Readings from Last 3 Encounters:  04/28/14 163 lb (73.936 kg)  04/22/14 168 lb 14 oz (76.6 kg)  04/09/14 173 lb 14.4 oz (78.881 kg)    temperature is 97.5 F (36.4 C). Her blood pressure is 119/66 and her pulse is 84. Her respiration is 20.  NAD, hard of hearing, no mucositis or thrush  CBC    Component Value Date/Time   WBC 10.4 04/23/2014 0618   RBC 3.26* 04/23/2014 0618   HGB 9.8* 04/23/2014 0618   HCT 30.1* 04/23/2014 0618   PLT 226 04/23/2014 0618   MCV 92.3 04/23/2014 0618   MCH 30.1 04/23/2014 0618   MCHC 32.6 04/23/2014 0618   RDW 14.7 04/23/2014 0618   LYMPHSABS 2.5 04/21/2014 1244   MONOABS 1.3* 04/21/2014 1244   EOSABS 0.4 04/21/2014 1244   BASOSABS 0.1 04/21/2014 1244     CMP     Component Value Date/Time   NA 144 04/23/2014 0618   K 4.0 04/23/2014 0618   CL 112 04/23/2014 0618   CO2 24 04/23/2014 0618   GLUCOSE 123* 04/23/2014 0618   BUN 19 04/23/2014 0618   CREATININE 1.25* 04/23/2014 0618   CALCIUM 8.9 04/23/2014 0618   PROT 5.5* 04/22/2014 0700   ALBUMIN 2.9* 04/22/2014 0700   AST 13 04/22/2014 0700   ALT 10 04/22/2014 0700   ALKPHOS 70 04/22/2014 0700   BILITOT 0.8 04/22/2014 0700   GFRNONAA 39* 04/23/2014 0618   GFRAA 45* 04/23/2014 0618      Impression:  The patient is tolerating radiotherapy.   Plan:  Continue radiotherapy as planned.  Lidocaine Rx given in case mucositis develops.  F/u in 3 weeks after last fraction.    -----------------------------------  Eppie Gibson, MD

## 2014-05-12 NOTE — Telephone Encounter (Signed)
Spoke with [name], [employer] rep re: patient's final radiation treatment is [facility], Feb 12th @ 10:30 am, 35 min treatment. She states she will be at [facility], rad onc dept at 11:00 am on 05/16/14 to check patient's pacemaker following her final treatment as instructed by Dr Sallyanne Kuster.

## 2014-05-12 NOTE — Progress Notes (Signed)
Patient denies pain. She is drinking protein chocolate drink but states the food where she is staying "is not good". Her friend/transpoter states pt is eating very little but will drink these chocolate drinks. No skin changes in neck treatment area.

## 2014-05-13 ENCOUNTER — Other Ambulatory Visit: Payer: Self-pay | Admitting: *Deleted

## 2014-05-13 MED ORDER — HYDROCODONE-ACETAMINOPHEN 5-325 MG PO TABS: ORAL_TABLET | ORAL | Status: DC

## 2014-05-13 NOTE — Telephone Encounter (Signed)
[facility] 

## 2014-05-13 NOTE — Progress Notes (Signed)
To provide support and encouragement, care continuity and to assess for needs, met with patient prior to Tomo tmt.  She was accompanied by her friend [name]. No needs were identified at this time.  Gayleen Orem, RN, BSN, [place] at [place] [phone]

## 2014-05-14 NOTE — Telephone Encounter (Signed)
Closed encounter °

## 2014-05-16 ENCOUNTER — Emergency Department (HOSPITAL_COMMUNITY): Payer: Medicare Other

## 2014-05-16 ENCOUNTER — Ambulatory Visit
Admission: RE | Admit: 2014-05-16 | Discharge: 2014-05-16 | Disposition: A | Discharge status: Home / Self Care | Payer: Medicare Other | Source: Ambulatory Visit | Attending: Radiation Oncology | Admitting: Radiation Oncology

## 2014-05-16 ENCOUNTER — Inpatient Hospital Stay (HOSPITAL_COMMUNITY)
Admission: EM | Admit: 2014-05-16 | Discharge: 2014-05-18 | DRG: 682 | Disposition: A | Discharge status: Skilled Nursing Facility | Payer: Medicare Other | Attending: Internal Medicine | Admitting: Internal Medicine

## 2014-05-16 ENCOUNTER — Inpatient Hospital Stay (HOSPITAL_COMMUNITY): Payer: Medicare Other

## 2014-05-16 ENCOUNTER — Encounter: Payer: Self-pay | Admitting: *Deleted

## 2014-05-16 ENCOUNTER — Ambulatory Visit: Admission: RE | Admit: 2014-05-16 | Payer: Medicare Other | Source: Ambulatory Visit

## 2014-05-16 ENCOUNTER — Encounter (HOSPITAL_COMMUNITY): Payer: Self-pay

## 2014-05-16 ENCOUNTER — Ambulatory Visit: Payer: Medicare Other | Admitting: Radiation Oncology

## 2014-05-16 DIAGNOSIS — E875 Hyperkalemia: Secondary | ICD-10-CM | POA: Diagnosis present

## 2014-05-16 DIAGNOSIS — H9190 Unspecified hearing loss, unspecified ear: Secondary | ICD-10-CM | POA: Diagnosis present

## 2014-05-16 DIAGNOSIS — Z794 Long term (current) use of insulin: Secondary | ICD-10-CM

## 2014-05-16 DIAGNOSIS — E039 Hypothyroidism, unspecified: Secondary | ICD-10-CM | POA: Diagnosis present

## 2014-05-16 DIAGNOSIS — Z87891 Personal history of nicotine dependence: Secondary | ICD-10-CM | POA: Diagnosis not present

## 2014-05-16 DIAGNOSIS — Z85828 Personal history of other malignant neoplasm of skin: Secondary | ICD-10-CM

## 2014-05-16 DIAGNOSIS — Z66 Do not resuscitate: Secondary | ICD-10-CM | POA: Diagnosis present

## 2014-05-16 DIAGNOSIS — F411 Generalized anxiety disorder: Secondary | ICD-10-CM | POA: Diagnosis present

## 2014-05-16 DIAGNOSIS — I2510 Atherosclerotic heart disease of native coronary artery without angina pectoris: Secondary | ICD-10-CM | POA: Diagnosis present

## 2014-05-16 DIAGNOSIS — E43 Unspecified severe protein-calorie malnutrition: Secondary | ICD-10-CM | POA: Diagnosis present

## 2014-05-16 DIAGNOSIS — N179 Acute kidney failure, unspecified: Secondary | ICD-10-CM | POA: Diagnosis present

## 2014-05-16 DIAGNOSIS — R4182 Altered mental status, unspecified: Secondary | ICD-10-CM

## 2014-05-16 DIAGNOSIS — F39 Unspecified mood [affective] disorder: Secondary | ICD-10-CM | POA: Diagnosis present

## 2014-05-16 DIAGNOSIS — I509 Heart failure, unspecified: Secondary | ICD-10-CM | POA: Diagnosis present

## 2014-05-16 DIAGNOSIS — E119 Type 2 diabetes mellitus without complications: Secondary | ICD-10-CM | POA: Diagnosis present

## 2014-05-16 DIAGNOSIS — M1990 Unspecified osteoarthritis, unspecified site: Secondary | ICD-10-CM | POA: Diagnosis present

## 2014-05-16 DIAGNOSIS — H6690 Otitis media, unspecified, unspecified ear: Secondary | ICD-10-CM

## 2014-05-16 DIAGNOSIS — C44300 Unspecified malignant neoplasm of skin of unspecified part of face: Secondary | ICD-10-CM

## 2014-05-16 DIAGNOSIS — N289 Disorder of kidney and ureter, unspecified: Secondary | ICD-10-CM

## 2014-05-16 DIAGNOSIS — Z6826 Body mass index (BMI) 26.0-26.9, adult: Secondary | ICD-10-CM | POA: Diagnosis not present

## 2014-05-16 DIAGNOSIS — R5383 Other fatigue: Secondary | ICD-10-CM | POA: Diagnosis present

## 2014-05-16 DIAGNOSIS — I959 Hypotension, unspecified: Secondary | ICD-10-CM | POA: Diagnosis present

## 2014-05-16 DIAGNOSIS — R0902 Hypoxemia: Secondary | ICD-10-CM | POA: Diagnosis present

## 2014-05-16 HISTORY — DX: Other specified malignant neoplasm of skin of other parts of face: C44.399

## 2014-05-16 HISTORY — DX: Unilateral primary osteoarthritis, unspecified knee: M17.10

## 2014-05-16 HISTORY — DX: Generalized anxiety disorder: F41.1

## 2014-05-16 HISTORY — DX: Type 2 diabetes mellitus without complications: E11.9

## 2014-05-16 HISTORY — DX: Syncope and collapse: R55

## 2014-05-16 HISTORY — DX: Unspecified mood (affective) disorder: F39

## 2014-05-16 HISTORY — DX: Hypothyroidism, unspecified: E03.9

## 2014-05-16 LAB — BASIC METABOLIC PANEL
Anion gap: 12 (ref 5–15)
BUN: 78 mg/dL — ABNORMAL HIGH (ref 6–23)
CALCIUM: 9.4 mg/dL (ref 8.4–10.5)
CHLORIDE: 110 mmol/L (ref 96–112)
CO2: 21 mmol/L (ref 19–32)
CREATININE: 2.56 mg/dL — AB (ref 0.50–1.10)
GFR calc Af Amer: 19 mL/min — ABNORMAL LOW (ref >90)
GFR calc non Af Amer: 16 mL/min — ABNORMAL LOW (ref >90)
Glucose, Bld: 184 mg/dL — ABNORMAL HIGH (ref 70–99)
Potassium: 6 mmol/L — ABNORMAL HIGH (ref 3.5–5.1)
Sodium: 143 mmol/L (ref 135–145)

## 2014-05-16 LAB — CBC WITH DIFFERENTIAL/PLATELET
BASOS ABS: 0.1 10*3/uL (ref 0.0–0.1)
Basophils Relative: 1 % (ref 0–1)
Eosinophils Absolute: 0.4 10*3/uL (ref 0.0–0.7)
Eosinophils Relative: 3 % (ref 0–5)
HCT: 38.8 % (ref 36.0–46.0)
HEMOGLOBIN: 12 g/dL (ref 12.0–15.0)
LYMPHS ABS: 2.2 10*3/uL (ref 0.7–4.0)
LYMPHS PCT: 17 % (ref 12–46)
MCH: 29.1 pg (ref 26.0–34.0)
MCHC: 30.9 g/dL (ref 30.0–36.0)
MCV: 93.9 fL (ref 78.0–100.0)
Monocytes Absolute: 1.4 10*3/uL — ABNORMAL HIGH (ref 0.1–1.0)
Monocytes Relative: 10 % (ref 3–12)
NEUTROS ABS: 9.1 10*3/uL — AB (ref 1.7–7.7)
Neutrophils Relative %: 69 % (ref 43–77)
Platelets: 219 10*3/uL (ref 150–400)
RBC: 4.13 MIL/uL (ref 3.87–5.11)
RDW: 15 % (ref 11.5–15.5)
WBC: 13.1 10*3/uL — ABNORMAL HIGH (ref 4.0–10.5)

## 2014-05-16 LAB — TROPONIN I: Troponin I: <0.03 ng/mL (ref <0.031)

## 2014-05-16 LAB — COMPREHENSIVE METABOLIC PANEL
ALBUMIN: 3.6 g/dL (ref 3.5–5.2)
ALT: 13 U/L (ref 0–35)
ANION GAP: 13 (ref 5–15)
AST: 20 U/L (ref 0–37)
Alkaline Phosphatase: 80 U/L (ref 39–117)
BUN: 85 mg/dL — ABNORMAL HIGH (ref 6–23)
CALCIUM: 9.9 mg/dL (ref 8.4–10.5)
CO2: 20 mmol/L (ref 19–32)
Chloride: 107 mmol/L (ref 96–112)
Creatinine, Ser: 2.87 mg/dL — ABNORMAL HIGH (ref 0.50–1.10)
GFR calc Af Amer: 16 mL/min — ABNORMAL LOW (ref >90)
GFR, EST NON AFRICAN AMERICAN: 14 mL/min — AB (ref >90)
GLUCOSE: 194 mg/dL — AB (ref 70–99)
POTASSIUM: 6.2 mmol/L — AB (ref 3.5–5.1)
Sodium: 140 mmol/L (ref 135–145)
Total Bilirubin: 0.6 mg/dL (ref 0.3–1.2)
Total Protein: 7.9 g/dL (ref 6.0–8.3)

## 2014-05-16 LAB — MRSA PCR SCREENING: MRSA by PCR: NEGATIVE

## 2014-05-16 LAB — GLUCOSE, CAPILLARY
Glucose-Capillary: 148 mg/dL — ABNORMAL HIGH (ref 70–99)
Glucose-Capillary: 91 mg/dL (ref 70–99)

## 2014-05-16 LAB — PROTIME-INR
INR: 1.1 (ref 0.00–1.49)
Prothrombin Time: 14.4 seconds (ref 11.6–15.2)

## 2014-05-16 LAB — CBG MONITORING, ED: GLUCOSE-CAPILLARY: 167 mg/dL — AB (ref 70–99)

## 2014-05-16 MED ORDER — TECHNETIUM TC 99M MEDRONATE IV KIT
25.0000 | PACK | Freq: Once | INTRAVENOUS | Status: AC | PRN
  Administered 2014-05-16: 25 via INTRAVENOUS

## 2014-05-16 MED ORDER — LORAZEPAM 0.5 MG PO TABS
0.2500 mg | ORAL_TABLET | Freq: Two times a day (BID) | ORAL | Status: DC | PRN
  Administered 2014-05-17: 0.25 mg via ORAL
  Filled 2014-05-16: qty 1

## 2014-05-16 MED ORDER — SODIUM CHLORIDE 0.9 % IV SOLN
INTRAVENOUS | Status: DC
  Administered 2014-05-16 – 2014-05-17 (×4): via INTRAVENOUS

## 2014-05-16 MED ORDER — LIDOCAINE VISCOUS 2 % MT SOLN
10.0000 mL | Freq: Four times a day (QID) | OROMUCOSAL | Status: DC | PRN
  Administered 2014-05-18: 10 mL via OROMUCOSAL
  Filled 2014-05-16 (×2): qty 10

## 2014-05-16 MED ORDER — INSULIN ASPART 100 UNIT/ML ~~LOC~~ SOLN
5.0000 [IU] | Freq: Once | SUBCUTANEOUS | Status: AC
  Administered 2014-05-16: 5 [IU] via INTRAVENOUS
  Filled 2014-05-16: qty 1

## 2014-05-16 MED ORDER — GABAPENTIN 300 MG PO CAPS
300.0000 mg | ORAL_CAPSULE | Freq: Two times a day (BID) | ORAL | Status: DC
  Administered 2014-05-16 – 2014-05-18 (×4): 300 mg via ORAL
  Filled 2014-05-16 (×4): qty 1

## 2014-05-16 MED ORDER — INSULIN ASPART 100 UNIT/ML ~~LOC~~ SOLN: 0.0000 [IU] | Freq: Every day | SUBCUTANEOUS | Status: DC

## 2014-05-16 MED ORDER — ALUM & MAG HYDROXIDE-SIMETH 200-200-20 MG/5ML PO SUSP: 30.0000 mL | Freq: Four times a day (QID) | ORAL | Status: DC | PRN

## 2014-05-16 MED ORDER — DIVALPROEX SODIUM 125 MG PO CPSP
125.0000 mg | ORAL_CAPSULE | Freq: Two times a day (BID) | ORAL | Status: DC
  Administered 2014-05-16 – 2014-05-18 (×4): 125 mg via ORAL
  Filled 2014-05-16 (×4): qty 1

## 2014-05-16 MED ORDER — ENOXAPARIN SODIUM 30 MG/0.3ML ~~LOC~~ SOLN
30.0000 mg | SUBCUTANEOUS | Status: DC
  Administered 2014-05-16 – 2014-05-17 (×2): 30 mg via SUBCUTANEOUS
  Filled 2014-05-16 (×2): qty 0.3

## 2014-05-16 MED ORDER — NITROGLYCERIN 0.4 MG SL SUBL: 0.4000 mg | SUBLINGUAL_TABLET | SUBLINGUAL | Status: DC | PRN

## 2014-05-16 MED ORDER — ISOSORBIDE MONONITRATE ER 30 MG PO TB24
30.0000 mg | ORAL_TABLET | Freq: Every day | ORAL | Status: DC
  Administered 2014-05-17 – 2014-05-18 (×2): 30 mg via ORAL
  Filled 2014-05-16 (×2): qty 1

## 2014-05-16 MED ORDER — SODIUM CHLORIDE 0.9 % IV BOLUS (SEPSIS)
500.0000 mL | Freq: Once | INTRAVENOUS | Status: AC
  Administered 2014-05-16: 500 mL via INTRAVENOUS

## 2014-05-16 MED ORDER — TECHNETIUM TC 99M DIETHYLENETRIAME-PENTAACETIC ACID: 42.0000 | Freq: Once | INTRAVENOUS | Status: AC | PRN

## 2014-05-16 MED ORDER — OXYBUTYNIN CHLORIDE ER 10 MG PO TB24
10.0000 mg | ORAL_TABLET | ORAL | Status: DC
  Administered 2014-05-18: 10 mg via ORAL
  Filled 2014-05-16: qty 1

## 2014-05-16 MED ORDER — ACETAMINOPHEN 325 MG PO TABS: 650.0000 mg | ORAL_TABLET | Freq: Four times a day (QID) | ORAL | Status: DC | PRN

## 2014-05-16 MED ORDER — ENSURE COMPLETE PO LIQD
237.0000 mL | Freq: Three times a day (TID) | ORAL | Status: DC
  Administered 2014-05-16 – 2014-05-18 (×3): 237 mL via ORAL
  Filled 2014-05-16 (×2): qty 237

## 2014-05-16 MED ORDER — ONDANSETRON HCL 4 MG PO TABS: 4.0000 mg | ORAL_TABLET | Freq: Four times a day (QID) | ORAL | Status: DC | PRN

## 2014-05-16 MED ORDER — ERYTHROMYCIN 5 MG/GM OP OINT
1.0000 "application " | TOPICAL_OINTMENT | OPHTHALMIC | Status: DC
  Administered 2014-05-17 – 2014-05-18 (×8): 1 via OPHTHALMIC
  Filled 2014-05-16: qty 3.5

## 2014-05-16 MED ORDER — INSULIN GLARGINE 100 UNIT/ML ~~LOC~~ SOLN
8.0000 [IU] | Freq: Every day | SUBCUTANEOUS | Status: DC
  Administered 2014-05-17: 8 [IU] via SUBCUTANEOUS
  Filled 2014-05-16 (×3): qty 0.08

## 2014-05-16 MED ORDER — SIMVASTATIN 20 MG PO TABS
20.0000 mg | ORAL_TABLET | Freq: Every day | ORAL | Status: DC
  Administered 2014-05-16 – 2014-05-17 (×2): 20 mg via ORAL
  Filled 2014-05-16 (×2): qty 1

## 2014-05-16 MED ORDER — METOPROLOL TARTRATE 25 MG PO TABS
25.0000 mg | ORAL_TABLET | Freq: Two times a day (BID) | ORAL | Status: DC
  Administered 2014-05-16 – 2014-05-18 (×4): 25 mg via ORAL
  Filled 2014-05-16 (×4): qty 1

## 2014-05-16 MED ORDER — INSULIN GLARGINE 100 UNIT/ML ~~LOC~~ SOLN: 14.0000 [IU] | Freq: Every day | SUBCUTANEOUS | Status: DC

## 2014-05-16 MED ORDER — SODIUM CHLORIDE 0.9 % IV SOLN
1.0000 g | Freq: Once | INTRAVENOUS | Status: AC
  Administered 2014-05-16: 1 g via INTRAVENOUS
  Filled 2014-05-16: qty 10

## 2014-05-16 MED ORDER — LISINOPRIL 5 MG PO TABS
5.0000 mg | ORAL_TABLET | Freq: Every day | ORAL | Status: DC
  Administered 2014-05-17 – 2014-05-18 (×2): 5 mg via ORAL
  Filled 2014-05-16 (×2): qty 1

## 2014-05-16 MED ORDER — LEVOTHYROXINE SODIUM 25 MCG PO TABS
25.0000 ug | ORAL_TABLET | Freq: Every day | ORAL | Status: DC
  Administered 2014-05-17 – 2014-05-18 (×2): 25 ug via ORAL
  Filled 2014-05-16 (×2): qty 1

## 2014-05-16 MED ORDER — SODIUM BICARBONATE 8.4 % IV SOLN
50.0000 meq | Freq: Once | INTRAVENOUS | Status: AC
  Administered 2014-05-16: 50 meq via INTRAVENOUS
  Filled 2014-05-16: qty 50

## 2014-05-16 MED ORDER — INSULIN ASPART 100 UNIT/ML ~~LOC~~ SOLN
0.0000 [IU] | Freq: Three times a day (TID) | SUBCUTANEOUS | Status: DC
  Administered 2014-05-16: 1 [IU] via SUBCUTANEOUS
  Administered 2014-05-17 (×2): 2 [IU] via SUBCUTANEOUS
  Administered 2014-05-17: 1 [IU] via SUBCUTANEOUS
  Administered 2014-05-18: 2 [IU] via SUBCUTANEOUS

## 2014-05-16 MED ORDER — HYDROCODONE-ACETAMINOPHEN 5-325 MG PO TABS
1.0000 | ORAL_TABLET | Freq: Four times a day (QID) | ORAL | Status: DC | PRN
  Administered 2014-05-17 – 2014-05-18 (×2): 1 via ORAL
  Filled 2014-05-16 (×2): qty 1

## 2014-05-16 MED ORDER — ENSURE PUDDING PO PUDG
1.0000 | Freq: Three times a day (TID) | ORAL | Status: DC
Start: 2014-05-16 — End: 2014-05-16

## 2014-05-16 MED ORDER — ACETAMINOPHEN 650 MG RE SUPP: 650.0000 mg | Freq: Four times a day (QID) | RECTAL | Status: DC | PRN

## 2014-05-16 MED ORDER — SODIUM CHLORIDE 0.9 % IV SOLN: INTRAVENOUS | Status: AC

## 2014-05-16 MED ORDER — DEXTROSE 50 % IV SOLN
50.0000 mL | Freq: Once | INTRAVENOUS | Status: AC
  Administered 2014-05-16: 50 mL via INTRAVENOUS
  Filled 2014-05-16: qty 50

## 2014-05-16 MED ORDER — ONDANSETRON HCL 4 MG/2ML IJ SOLN
4.0000 mg | Freq: Four times a day (QID) | INTRAMUSCULAR | Status: DC | PRN
Start: 2014-05-16 — End: 2014-05-18

## 2014-05-16 NOTE — Progress Notes (Addendum)
Clinical Social Work Department BRIEF PSYCHOSOCIAL ASSESSMENT 05/16/2014  Patient:  [name], [name]     Account Number:  [id number]     Admit date:  05/16/2014  Clinical Social Worker:  [name]  Date/Time:  05/16/2014 03:00 PM  Referred by:  Physician  Date Referred:  05/16/2014 Referred for  SNF Placement   Other Referral:   Interview type:  Patient Other interview type:    PSYCHOSOCIAL DATA Living Status:  FACILITY Admitted from facility:  [name] Level of care:  [name] Primary support name:  [name] Primary support relationship to patient:  FRIEND Degree of support available:   strong    CURRENT CONCERNS Current Concerns  Post-Acute Placement   Other Concerns:    SOCIAL WORK ASSESSMENT / PLAN CSW met with pt and pt friend [name] at bedside. Pt alert and oriented however unable to engage in assessment. Patient also taken to testing. Pt friend shared that patient is a resident at [name] and is a Conservation officer, historic buildings term resident. Patient friend shared that the yare happy with the care there, however they hope [name] may have a medicaid bed avialable as she had been there before as a medicaid resident. Patient friend shared that pt can return to [name], however would liek to see if [name] is an option.    Patient friend shared that patient is very hard of hearing, has a speech impediment, and sometimes has difficulty processing things. Pt friend shared that she and two others have stepped in to help as patient was hosptialized a while back in an emergency and she has no family or HCPOA.   Assessment/plan status:  Psychosocial Support/Ongoing Assessment of Needs Other assessment/ plan:   Information/referral to community resources:   none indentified    PATIENT'S/FAMILY'S RESPONSE TO PLAN OF CARE: Patient friend thanked csw for concern and support. Pt friend is hopeful  patient can go to [name] under medicaid, however if not pt plans to return to [name].    Addendum: No medicaid long term beds available in near future at [name]. CSW will update pt and pt friend once patient is back from testing.    [name] [phone]  ED CSW 05/16/2014 4:04 PM

## 2014-05-16 NOTE — ED Notes (Signed)
Bed: RESB Expected date:  Expected time:  Means of arrival:  Comments: CANCER PT FROM RAD

## 2014-05-16 NOTE — H&P (Addendum)
[facility] History and Physical  Brayley Mackowiak ZOX:[id number] DOB: 11-14-1928 DOA: 05/16/2014   PCP: No primary care provider on file.    Chief Complaint: Lethargy  HPI: [name] is a 81 y.o. female CHF, Coronary artery disease, Hypothyroidism, Pacemaker, Insulin dependent diabetes mellitus, CAD, Skin cancer of face who presents from the cancer center for lethargy and hypotension. Upon arrival to the ER she is noted to be awake and alert and not hypotensive. Initially it was thought that she may be able to be discharged however lab work has returned and she is found to have acute renal failure with hyperkalemia and is therefore going to be admitted. The patient is a poor historian- there is a friend at the bedside who is giving the history. It appears according to her medications list from past admission that she is on Lasix at home. The friend notes that the patient has been having problems with pedal edema for a while but it has now improved. The friend also states that the patient has had a very poor appetite and poor by mouth intake for the past few weeks. The patient herself has no complaints and is not able to give me much input. She does state that she does not want to return to [facility] once she is discharged from the hospital. She is also noted to be mildly hypoxic in the ER with a pulse ox of 89% and therefore a VQ scan has been ordered. She does not complain of any shortness of breath or cough. Chest x-ray is clear.   General: The patient denies anorexia, fever, weight loss Cardiac: Denies chest pain, syncope, palpitations, pedal edema  Respiratory: Denies cough, shortness of breath, wheezing GI: Denies severe indigestion/heartburn, abdominal pain, nausea, vomiting, diarrhea and constipation GU: Denies hematuria, incontinence, dysuria  Musculoskeletal: Denies arthritis  Skin: Denies suspicious skin lesions Neurologic: Denies focal weakness or numbness, change in  vision Psychiatry: Denies depression or anxiety. Hematologic: no easy bruising or bleeding   Past Medical History  Diagnosis Date  . Syncope and collapse   . Localized osteoarthrosis, lower leg   . Generalized anxiety disorder   . Mild mood disorder   . Other specified malignant neoplasm of skin of other parts of face   . Other specified malignant neoplasm of skin of other parts of face   . Hypothyroidism, adult   . Diabetes mellitus type II, controlled, with no complications     No past surgical history on file.  Social History:has quit smoking, does not drink Lives at a SNF    No Known Allergies  Family history: no significant family history   Prior to Admission medications   Not on File     Physical Exam: Filed Vitals:   05/16/14 1116 05/16/14 1159 05/16/14 1217  BP: 112/50  115/49  Pulse: 79  80  Temp: 97.8 F (36.6 C) 98.4 F (36.9 C)   TempSrc: Oral Rectal   Resp: 14  17  SpO2: 88%  97%     General: AAO x 3, no acute distress HEENT: Normocephalic and Atraumatic, Mucous membranes pink- left sided ptosis and left facial droop                PERRLA; EOM intact; No scleral icterus,                 Nares: Patent, Oropharynx: Clear, Fair Dentition                 Neck: FROM,  no cervical lymphadenopathy, thyromegaly, carotid bruit or JVD;  Breasts: deferred CHEST WALL: No tenderness  CHEST: Normal respiration, clear to auscultation bilaterally  HEART: Regular rate and rhythm; no murmurs rubs or gallops  BACK: No kyphosis or scoliosis; no CVA tenderness  GI: Positive Bowel Sounds, soft, non-tender; no masses, no organomegaly Rectal Exam: deferred MSK: No cyanosis, clubbing, or edema Genitalia: not examined  SKIN:  no rash or ulceration  CNS: Alert and Oriented x 4, Nonfocal exam, CN 2-12 intact  Labs on Admission:  Basic Metabolic Panel:  Recent Labs Lab 05/16/14 1138  NA 140  K 6.2*  CL 107  CO2 20  GLUCOSE 194*  BUN 85*  CREATININE 2.87*   CALCIUM 9.9   Liver Function Tests:  Recent Labs Lab 05/16/14 1138  AST 20  ALT 13  ALKPHOS 80  BILITOT 0.6  PROT 7.9  ALBUMIN 3.6   No results for input(s): LIPASE, AMYLASE in the last 168 hours. No results for input(s): AMMONIA in the last 168 hours. CBC:  Recent Labs Lab 05/16/14 1138  WBC 13.1*  NEUTROABS 9.1*  HGB 12.0  HCT 38.8  MCV 93.9  PLT 219   Cardiac Enzymes:  Recent Labs Lab 05/16/14 1138  TROPONINI <0.03    BNP (last 3 results) No results for input(s): BNP in the last 8760 hours.  ProBNP (last 3 results) No results for input(s): PROBNP in the last 8760 hours.  CBG:  Recent Labs Lab 05/16/14 1133  GLUCAP 167*    Radiological Exams on Admission: Dg Chest 2 View  05/16/2014   CLINICAL DATA:  Altered mental status.  History of skin cancer.  EXAM: CHEST  2 VIEW  COMPARISON:  04/21/2014; 03/22/2011; chest CT - 04/11/2014  FINDINGS: Grossly unchanged cardiac silhouette and mediastinal contours given persistently reduced lung volumes and patient rotation. Stable position of support apparatus. No focal airspace opacities. No pleural effusion or pneumothorax. No evidence of edema. No acute osseus abnormalities.  IMPRESSION: No acute cardiopulmonary disease on this hypoventilated and rotated examination.   Electronically Signed   By: Sandi Mariscal M.D.   On: 05/16/2014 12:36   Ct Head Wo Contrast  05/16/2014   CLINICAL DATA:  Altered mental status  EXAM: CT HEAD WITHOUT CONTRAST  TECHNIQUE: Contiguous axial images were obtained from the base of the skull through the vertex without intravenous contrast.  COMPARISON:  04/21/2014  FINDINGS: Skull and Sinuses:Status post left parotidectomy. There is no osseous erosion or skullbase foraminal enlargement.  Orbits: No acute abnormality.  Brain: No evidence of acute infarction, hemorrhage, hydrocephalus, or mass lesion/mass effect.  Advanced chronic small vessel ischemic injury with remote small vessel infarct in the  upper right cerebellum and right upper pons. Remote bilateral infarcts of the right caudate head, left caudate body, bilateral putamen, and left thalamus. There is chronic ischemic gliosis in the bilateral cerebral white matter. Generalized cerebral volume loss.  IMPRESSION: 1. No acute intracranial findings. 2. Advanced chronic small vessel disease with remote subcortical and posterior fossa infarcts.   Electronically Signed   By: Monte Fantasia M.D.   On: 05/16/2014 12:46    EKG: Independently reviewed. Sinus rhythm with no ST or T wave abnormalities  Assessment/Plan Principal Problem:   ARF (acute renal failure) - will hold Lasix and hydrate with IVF while watching renal function closely - she has a h/o CHF listed in her chart and there is a history of pedal edema according to the friend but there is no ECHO in  the computer system- will follow O2 sats closely to ensure she is not developing CHF -As by mouth intake is poor, I will add in Ensure supplements  Active Problems:   Hyperkalemia - receiving D50, insulin and Bicarb-cont to hydrate- should improve when renal function improves  Hypoxia -Asymptomatic-follow-up on V/Q  Skin cancer with mets with to and status post left parotid parotidectomy status post left carotid ectomy on 12-11- - today was her last dose of radiation to the left face  Diabetes mellitus -cont lantus at lower then home dose and start sliding scale   NOTE: awaiting med list to be completed.   Consulted:   Code Status: DO NOT RESUSCITATE Family Communication: Communication with friend  DVT Prophylaxis: Lovenox  Time spent: 57 min  [place], MD [facility]  If 7PM-7AM, please contact night-coverage [email or web address] 05/16/2014, 1:59 PM

## 2014-05-16 NOTE — Progress Notes (Signed)
10:40 am Patient brought to nursing by RT. Patient was not treated due to lethargy. Patient sitting in wheelchair with eyes closed. Gayleen Orem with patient as well. VS checked x 2 with little to no variation in BP and HR. O2 sat on room air 96%. Patient denies pain other than in her feet. Patient became more alert, oriented to person and to this RN and Liliane Channel.  Drinking sips of water. Dr Isidore Moos informed and in to see patient. Per Dr Isidore Moos, patient transported to [facility] ED per wheelchair by Wenda Low, transporter. This RN spoke with Caryl Pina, RN charge in ED. Informed her of patient's status, gave brief history of patient and this morning's events.  Spoke with [employer] transportation and advised him pt is in [place] ED. Spoke with Amalia Hailey, LPN responsible for pt at [facility] and informed her of pt's status and transfer to [facility] ED. Informed her she will be updated later today re: pt's status.  Spoke with Dannial Monarch, [employer] rep to inform her of patient's location. Pt's pacemaker was to be checked following her final radiation treatment today.

## 2014-05-16 NOTE — ED Notes (Addendum)
USING [facility] AT HOME TRANSMITTER TO READ PACEMAKER. TODD NT TO ASSIST

## 2014-05-16 NOTE — ED Provider Notes (Signed)
CSN: [id number]     Arrival date & time 05/16/14  1117 History   First MD Initiated Contact with Patient 05/16/14 1120     Chief Complaint  Patient presents with  . Hypotension  . Altered Mental Status  . Cancer    RECENT TX     (Consider location/radiation/quality/duration/timing/severity/associated sxs/prior Treatment) Patient is a 81 y.o. female presenting with altered mental status. The history is provided by the patient.  Altered Mental Status Presenting symptoms: lethargy   Severity:  Mild Most recent episode:  Today Episode history:  Continuous Timing:  Constant Progression:  Resolved Chronicity:  New Context comment:  Spontaneously Associated symptoms: no abdominal pain, no fever, no headaches, no nausea and no vomiting     No past medical history on file. No past surgical history on file. No family history on file. History  Substance Use Topics  . Smoking status: Not on file  . Smokeless tobacco: Not on file  . Alcohol Use: Not on file   OB History    No data available     Review of Systems  Constitutional: Negative for fever and fatigue.  HENT: Negative for congestion and drooling.   Eyes: Negative for pain.  Respiratory: Negative for cough and shortness of breath.   Cardiovascular: Negative for chest pain.  Gastrointestinal: Negative for nausea, vomiting, abdominal pain and diarrhea.  Genitourinary: Negative for dysuria and hematuria.  Musculoskeletal: Negative for back pain, gait problem and neck pain.  Skin: Negative for color change.  Neurological: Negative for dizziness and headaches.  Hematological: Negative for adenopathy.  Psychiatric/Behavioral: Negative for behavioral problems.  All other systems reviewed and are negative.     Allergies  Review of patient's allergies indicates not on file.  Home Medications   Prior to Admission medications   Not on File   There were no vitals taken for this visit. Physical Exam  Constitutional:  She appears well-developed and well-nourished.  HENT:  Head: Normocephalic.  Mouth/Throat: Oropharynx is clear and moist. No oropharyngeal exudate.  Eyes: Conjunctivae and EOM are normal. Pupils are equal, round, and reactive to light.  Neck: Normal range of motion. Neck supple.  Cardiovascular: Normal rate, regular rhythm, normal heart sounds and intact distal pulses.  Exam reveals no gallop and no friction rub.   No murmur heard. Pulmonary/Chest: Effort normal and breath sounds normal. No respiratory distress. She has no wheezes.  Abdominal: Soft. Bowel sounds are normal. There is no tenderness. There is no rebound and no guarding.  Musculoskeletal: Normal range of motion. She exhibits no edema or tenderness.  Neurological: She is alert.  alert, oriented x2, thought the year was 2012 speech: normal in context and clarity memory: intact grossly cranial nerves II-XII: left sided facial droop at baseline motor strength: 4/5 strength in LLE, otherwise full proximally and distally sensation: dec sensation on left side of face which pt states is baseline, intact to light touch diffusely otherwise cerebellar: finger-to-nose intact  Skin: Skin is warm and dry.  Psychiatric: She has a normal mood and affect. Her behavior is normal.  Nursing note and vitals reviewed.   ED Course  Procedures (including critical care time) Labs Review Labs Reviewed  CBC WITH DIFFERENTIAL/PLATELET - Abnormal; Notable for the following:    WBC 13.1 (*)    Neutro Abs 9.1 (*)    Monocytes Absolute 1.4 (*)    All other components within normal limits  COMPREHENSIVE METABOLIC PANEL - Abnormal; Notable for the following:    Potassium 6.2 (*)  Glucose, Bld 194 (*)    BUN 85 (*)    Creatinine, Ser 2.87 (*)    GFR calc non Af Amer 14 (*)    GFR calc Af Amer 16 (*)    All other components within normal limits  CBG MONITORING, ED - Abnormal; Notable for the following:    Glucose-Capillary 167 (*)    All other  components within normal limits  TROPONIN I  PROTIME-INR  URINALYSIS, ROUTINE W REFLEX MICROSCOPIC  CBC  CREATININE, SERUM  BASIC METABOLIC PANEL    Imaging Review Dg Chest 2 View  05/16/2014   CLINICAL DATA:  Altered mental status.  History of skin cancer.  EXAM: CHEST  2 VIEW  COMPARISON:  04/21/2014; 03/22/2011; chest CT - 04/11/2014  FINDINGS: Grossly unchanged cardiac silhouette and mediastinal contours given persistently reduced lung volumes and patient rotation. Stable position of support apparatus. No focal airspace opacities. No pleural effusion or pneumothorax. No evidence of edema. No acute osseus abnormalities.  IMPRESSION: No acute cardiopulmonary disease on this hypoventilated and rotated examination.   Electronically Signed   By: Sandi Mariscal M.D.   On: 05/16/2014 12:36   Ct Head Wo Contrast  05/16/2014   CLINICAL DATA:  Altered mental status  EXAM: CT HEAD WITHOUT CONTRAST  TECHNIQUE: Contiguous axial images were obtained from the base of the skull through the vertex without intravenous contrast.  COMPARISON:  04/21/2014  FINDINGS: Skull and Sinuses:Status post left parotidectomy. There is no osseous erosion or skullbase foraminal enlargement.  Orbits: No acute abnormality.  Brain: No evidence of acute infarction, hemorrhage, hydrocephalus, or mass lesion/mass effect.  Advanced chronic small vessel ischemic injury with remote small vessel infarct in the upper right cerebellum and right upper pons. Remote bilateral infarcts of the right caudate head, left caudate body, bilateral putamen, and left thalamus. There is chronic ischemic gliosis in the bilateral cerebral white matter. Generalized cerebral volume loss.  IMPRESSION: 1. No acute intracranial findings. 2. Advanced chronic small vessel disease with remote subcortical and posterior fossa infarcts.   Electronically Signed   By: Monte Fantasia M.D.   On: 05/16/2014 12:46     EKG Interpretation   Date/Time:  Friday May 16 2014 11:20:39 EST Ventricular Rate:  79 PR Interval:  182 QRS Duration: 97 QT Interval:  358 QTC Calculation: 410 R Axis:   16 Text Interpretation:  Sinus rhythm Confirmed by Daxen Lanum  MD, Daymein Nunnery  ([id number]) on 05/16/2014 11:40:32 AM     CRITICAL CARE Performed by: Pamella Pert, S Total critical care time: 30 min Critical care time was exclusive of separately billable procedures and treating other patients. Critical care was necessary to treat or prevent imminent or life-threatening deterioration. Critical care was time spent personally by me on the following activities: development of treatment plan with patient and/or surrogate as well as nursing, discussions with consultants, evaluation of patient's response to treatment, examination of patient, obtaining history from patient or surrogate, ordering and performing treatments and interventions, ordering and review of laboratory studies, ordering and review of radiographic studies, pulse oximetry and re-evaluation of patient's condition.  MDM   Final diagnoses:  Altered mental status  Hypoxia  Renal insufficiency  Hyperkalemia    11:39 AM 81 y.o. female history of heart failure, skin cancer of the face who presented to the [facility] today for radiation and was found to have increased drowsiness and appeared lethargic. The RN that accompanied her from the cancer center stated that she had a systolic blood  pressure in the 60s. On my exam now she is alert and oriented 2, she thinks the year is 2012. She has a normal blood pressure. She denies any pain or shortness of breath. The RN from the [facility] agrees that she is much more well-appearing that she was prior to arrival. Her blood sugar was normal. We'll get screening labs and imaging.  Patient found to have worsening renal insufficiency and hyperkalemia. Given calcium gluconate, and a bicarbonate, 5 units of insulin, and D50. Will admit to the hospitalist.    Pamella Pert, MD 05/16/14 [phone]

## 2014-05-16 NOTE — Progress Notes (Signed)
Pt received to room 1414 from ER. Report given by ER RN Rica Mote.

## 2014-05-16 NOTE — ED Notes (Signed)
Per Radiation Oncology- Dr. Eppie Gibson referred this pt to [facility] for evaluation. Pt  Resides at [facility]. Sent here for [facility] for Skin Cancer. Recent TX. Pt presents lethargic/weak without reason (no pain medication given ). VSS other than BP 64/25 x 2 per staff in Oncology. No manual taken.

## 2014-05-16 NOTE — Care Management Note (Signed)
    Page 1 of 1   05/16/2014     4:44:37 PM CARE MANAGEMENT NOTE 05/16/2014  Patient:  [name], [name]   Account Number:  [id number]  Date Initiated:  05/16/2014  Documentation initiated by:  Dessa Phi  Subjective/Objective Assessment:   81 y/o f admitted w/ARF     Action/Plan:   From SNF.   Anticipated DC Date:  05/20/2014   Anticipated DC Plan:  [place]  CM consult      Choice offered to / List presented to:             Status of service:  In process, will continue to follow Medicare Important Message given?   (If response is "NO", the following Medicare IM given date fields will be blank) Date Medicare IM given:   Medicare IM given by:   Date Additional Medicare IM given:   Additional Medicare IM given by:    Discharge Disposition:    Per UR Regulation:  Reviewed for med. necessity/level of care/duration of stay  If discussed at [place] of Stay Meetings, dates discussed:    Comments:  05/16/14 Dessa Phi RN BSN NCM [phone] Monitor progress.

## 2014-05-16 NOTE — ED Notes (Signed)
Patient transported to [facility]

## 2014-05-16 NOTE — ED Notes (Signed)
Spoke to pam...klj 

## 2014-05-16 NOTE — ED Notes (Signed)
ADMITTING MD RIZWAN PRESENT TO EVALUATE THIS PT

## 2014-05-16 NOTE — ED Notes (Signed)
OSCAR RN TRANSFERRED THIS PT/BELONGS WITH THIS PT

## 2014-05-17 ENCOUNTER — Encounter: Payer: Self-pay | Admitting: *Deleted

## 2014-05-17 DIAGNOSIS — C44310 Basal cell carcinoma of skin of unspecified parts of face: Secondary | ICD-10-CM

## 2014-05-17 LAB — BASIC METABOLIC PANEL
ANION GAP: 6 (ref 5–15)
BUN: 66 mg/dL — ABNORMAL HIGH (ref 6–23)
CALCIUM: 9.2 mg/dL (ref 8.4–10.5)
CHLORIDE: 112 mmol/L (ref 96–112)
CO2: 27 mmol/L (ref 19–32)
CREATININE: 1.97 mg/dL — AB (ref 0.50–1.10)
GFR calc non Af Amer: 22 mL/min — ABNORMAL LOW (ref >90)
GFR, EST AFRICAN AMERICAN: 26 mL/min — AB (ref >90)
GLUCOSE: 172 mg/dL — AB (ref 70–99)
POTASSIUM: 5.2 mmol/L — AB (ref 3.5–5.1)
SODIUM: 145 mmol/L (ref 135–145)

## 2014-05-17 LAB — GLUCOSE, CAPILLARY
GLUCOSE-CAPILLARY: 174 mg/dL — AB (ref 70–99)
GLUCOSE-CAPILLARY: 160 mg/dL — AB (ref 70–99)
GLUCOSE-CAPILLARY: 149 mg/dL — AB (ref 70–99)
Glucose-Capillary: 157 mg/dL — ABNORMAL HIGH (ref 70–99)

## 2014-05-17 LAB — CBC
HEMATOCRIT: 32.7 % — AB (ref 36.0–46.0)
HEMOGLOBIN: 10 g/dL — AB (ref 12.0–15.0)
MCH: 28.9 pg (ref 26.0–34.0)
MCHC: 30.6 g/dL (ref 30.0–36.0)
MCV: 94.5 fL (ref 78.0–100.0)
PLATELETS: 211 10*3/uL (ref 150–400)
RBC: 3.46 MIL/uL — ABNORMAL LOW (ref 3.87–5.11)
RDW: 14.9 % (ref 11.5–15.5)
WBC: 8.6 10*3/uL (ref 4.0–10.5)

## 2014-05-17 NOTE — Progress Notes (Signed)
To provide support and encouragement, care continuity and to assess for needs, met with patient for her final [place] tmt.  She was brought to [facility] by transporter service.  Her friend [name] had not arrived. 1. Patient lethargic, minimally verbal and interactive.  This was a significant change from Monday when she came for tmt.  I called [employer], spoke with RN Amalia Hailey, asked about recent changes in LOL, medications.  She did not provide pertinent explanations for our observations. 2. Patient taken to Nursing for assessment, Dr. Isidore Moos ordered ER admission. 3. I called her friend [name] to inform her of events.  She indicated she would come to ER momentarily. 4. I met with patient in ER to provide support and comfort, provided information to ER RN and MD.  Patient's VS and LOC notably improved 5. I left briefly, returned, [name] had arrived.  I provided her further explanation of events. I will continue to follow patient closely.  Gayleen Orem, RN, BSN, [place] at [place] [phone]

## 2014-05-17 NOTE — Progress Notes (Signed)
[facility] Progress Note   [name] TSV:[id number] DOB: 13-Apr-1928 DOA: 05/16/2014 PCP: No primary care provider on file.  Brief narrative: [name] is a 81 y.o. female is a 81 y.o. female CHF, Coronary artery disease, Hypothyroidism, Pacemaker, Insulin dependent diabetes mellitus, CAD, Skin cancer of face who presents from the cancer center for lethargy and hypotension which resolved on arrival to the ER. She is admitted for abnormal lab work with acute renal failure and hyperkalemia in setting of Lasix use and poor PO intake.    Subjective: Sleeping, has no complaint when she awoken  Assessment/Plan: Principal Problem:   ARF (acute renal failure)--  Hyperkalemia - cont IVF - have emphasized that she needs to increase her PO intake - she appears to understand - will cont to monitor K+- will give a small dose of Lactulose today  Active Problems:    Hypoxia - resolved-patient was asymptomatic and etiology was unclear- may have been inaccurate readings vs atelectasis - CXR and VQ noted to be negative    Skin cancer of face - cont oupt treatments  DM - cont insulin - I had to cut back on dose of Lantus as her PO intake has been poor- sugars are stable on this dose- cont to follow  Code Status: DNR Family Communication:  Disposition Plan: back to sNF when labs normalize DVT prophylaxis: Lovenox  Consultants:   Procedures:   Antibiotics: Anti-infectives    None      Objective: Filed Weights   05/16/14 1800  Weight: 68.221 kg (150 lb 6.4 oz)    Intake/Output Summary (Last 24 hours) at 05/17/14 1000 Last data filed at 05/17/14 0845  Gross per 24 hour  Intake 2348.33 ml  Output      0 ml  Net 2348.33 ml     Vitals Filed Vitals:   05/16/14 1649 05/16/14 1800 05/16/14 2156 05/17/14 0428  BP: 114/34  122/69 112/59  Pulse: 78  82 71  Temp: 97.5 F (36.4 C)  97.7 F (36.5 C) 98.6 F (37 C)  TempSrc: Axillary  Oral Oral  Resp: 16   20 16   Height:  5\' 3"  (1.6 m)    Weight:  68.221 kg (150 lb 6.4 oz)    SpO2: 100%  97% 93%    Exam: General: AAO x 3,  No acute respiratory distress Lungs: Clear to auscultation bilaterally without wheezes or crackles Cardiovascular: Regular rate and rhythm without murmur gallop or rub normal S1 and S2 Abdomen: Nontender, nondistended, soft, bowel sounds positive, no rebound, no ascites, no appreciable mass Extremities: No significant cyanosis, clubbing, or edema bilateral lower extremities  Data Reviewed: Basic Metabolic Panel:  Recent Labs Lab 05/16/14 1138 05/16/14 1738 05/17/14 0550  NA 140 143 145  K 6.2* 6.0* 5.2*  CL 107 110 112  CO2 20 21 27   GLUCOSE 194* 184* 172*  BUN 85* 78* 66*  CREATININE 2.87* 2.56* 1.97*  CALCIUM 9.9 9.4 9.2   Liver Function Tests:  Recent Labs Lab 05/16/14 1138  AST 20  ALT 13  ALKPHOS 80  BILITOT 0.6  PROT 7.9  ALBUMIN 3.6   No results for input(s): LIPASE, AMYLASE in the last 168 hours. No results for input(s): AMMONIA in the last 168 hours. CBC:  Recent Labs Lab 05/16/14 1138 05/17/14 0550  WBC 13.1* 8.6  NEUTROABS 9.1*  --   HGB 12.0 10.0*  HCT 38.8 32.7*  MCV 93.9 94.5  PLT 219 211   Cardiac Enzymes:  Recent Labs  Lab 05/16/14 1138  TROPONINI <0.03   BNP (last 3 results) No results for input(s): BNP in the last 8760 hours.  ProBNP (last 3 results) No results for input(s): PROBNP in the last 8760 hours.  CBG:  Recent Labs Lab 05/16/14 1133 05/16/14 1754 05/16/14 2152 05/17/14 0750  GLUCAP 167* 148* 91 174*    Recent Results (from the past 240 hour(s))  MRSA PCR Screening     Status: None   Collection Time: 05/16/14  6:19 PM  Result Value Ref Range Status   MRSA by PCR NEGATIVE NEGATIVE Final    Comment:        The GeneXpert MRSA Assay (FDA approved for NASAL specimens only), is one component of a comprehensive MRSA colonization surveillance program. It is not intended to diagnose  MRSA infection nor to guide or monitor treatment for MRSA infections.      Studies:  Recent x-ray studies have been reviewed in detail by the Attending Physician  Scheduled Meds:  Scheduled Meds: . divalproex  125 mg Oral BID  . enoxaparin (LOVENOX) injection  30 mg Subcutaneous Q24H  . erythromycin  1 application Left Eye J0K  . feeding supplement (ENSURE COMPLETE)  237 mL Oral TID BM  . gabapentin  300 mg Oral BID  . insulin aspart  0-5 Units Subcutaneous QHS  . insulin aspart  0-9 Units Subcutaneous TID WC  . insulin glargine  8 Units Subcutaneous QHS  . isosorbide mononitrate  30 mg Oral Q breakfast  . levothyroxine  25 mcg Oral QAC breakfast  . lisinopril  5 mg Oral Q breakfast  . metoprolol tartrate  25 mg Oral BID  . [START ON 05/18/2014] oxybutynin  10 mg Oral QODAY  . simvastatin  20 mg Oral QHS   Continuous Infusions: . sodium chloride 125 mL/hr at 05/17/14 9381    Time spent on care of this patient: 35 min   [place], MD 05/17/2014, 10:00 AM  LOS: 1 day   [facility] Office  [phone] Pager - Text Page per [email or web address]  If 7PM-7AM, please contact night-coverage [email or web address]

## 2014-05-17 NOTE — Progress Notes (Signed)
To provide support and encouragement, visited patient in [place].  She was alert and oriented to person and place.  We spoke briefly, I indicated I would check on her Monday.  She thanked me.  Gayleen Orem, RN, BSN, [place] at [place] [phone]

## 2014-05-18 DIAGNOSIS — E43 Unspecified severe protein-calorie malnutrition: Secondary | ICD-10-CM

## 2014-05-18 DIAGNOSIS — H6640 Suppurative otitis media, unspecified, unspecified ear: Secondary | ICD-10-CM

## 2014-05-18 DIAGNOSIS — H6690 Otitis media, unspecified, unspecified ear: Secondary | ICD-10-CM

## 2014-05-18 LAB — BASIC METABOLIC PANEL
Anion gap: 5 (ref 5–15)
BUN: 37 mg/dL — ABNORMAL HIGH (ref 6–23)
CHLORIDE: 115 mmol/L — AB (ref 96–112)
CO2: 23 mmol/L (ref 19–32)
Calcium: 8.6 mg/dL (ref 8.4–10.5)
Creatinine, Ser: 1.42 mg/dL — ABNORMAL HIGH (ref 0.50–1.10)
GFR calc non Af Amer: 33 mL/min — ABNORMAL LOW (ref >90)
GFR, EST AFRICAN AMERICAN: 38 mL/min — AB (ref >90)
GLUCOSE: 126 mg/dL — AB (ref 70–99)
POTASSIUM: 4.5 mmol/L (ref 3.5–5.1)
SODIUM: 143 mmol/L (ref 135–145)

## 2014-05-18 LAB — GLUCOSE, CAPILLARY
Glucose-Capillary: 131 mg/dL — ABNORMAL HIGH (ref 70–99)
Glucose-Capillary: 148 mg/dL — ABNORMAL HIGH (ref 70–99)

## 2014-05-18 MED ORDER — CIPROFLOXACIN-DEXAMETHASONE 0.3-0.1 % OT SUSP
4.0000 [drp] | Freq: Two times a day (BID) | OTIC | Status: DC
  Administered 2014-05-18: 4 [drp] via OTIC
  Filled 2014-05-18: qty 7.5

## 2014-05-18 MED ORDER — ENSURE COMPLETE PO LIQD: 237.0000 mL | Freq: Three times a day (TID) | ORAL | Status: AC

## 2014-05-18 MED ORDER — CIPROFLOXACIN-DEXAMETHASONE 0.3-0.1 % OT SUSP: 4.0000 [drp] | Freq: Two times a day (BID) | OTIC | Status: AC

## 2014-05-18 MED ORDER — INSULIN GLARGINE 100 UNIT/ML ~~LOC~~ SOLN: 8.0000 [IU] | Freq: Every day | SUBCUTANEOUS | Status: AC

## 2014-05-18 NOTE — Clinical Social Work Note (Signed)
CSW received a call from RN that pt was ready for discharge  CSW reviewed chart and provided discharge packet to RN  CSW called and spoke with [facility] to arrange for discharge from hospital and admission into their SNF  CSW spoke with pt's friend [name] who was at pt's bedside to let her know pt was discharging  CSW caled for transport  No further CSW needs  CSW signing off  .Dede Query, LCSW [facility] Clinical Social Worker - Weekend Coverage cell #: [phone]

## 2014-05-18 NOTE — Progress Notes (Signed)
Pt has purulent drainage from left ear.  Family member states pt was treated for left ear infection recently.  Pt denies pain in that ear.  Does have a temp of 99.1 at HS.  Pt continues to urinate frequently incontinently so we are unable to obtain UA ordered 2 days ago.  Urine does not have an odor.

## 2014-05-18 NOTE — Discharge Summary (Addendum)
Physician Discharge Summary  [name] XYI:[id number] DOB: [birth year] DOA: 05/16/2014  PCP: No primary care provider on file.  Admit date: 05/16/2014 Discharge date: 05/18/2014  Time spent: 55 minutes  Recommendations for Outpatient Follow-up:  1. return to SNF- bmet in 1 wk- lasix discontinued- use TEDS if pedal edema occurs  2. ENT consult   Discharge Condition: stable Diet recommendation: regular diet - encourage liquids   Discharge Diagnoses:  Principal Problem:   ARF (acute renal failure) Active Problems:   Hyperkalemia   Hypoxia   Skin cancer of face   Otitis media   Protein-calorie malnutrition, severe   History of present illness:  [name] is a 81 y.o. female is a 81 y.o. female CHF, Coronary artery disease, Hypothyroidism, Pacemaker, Insulin dependent diabetes mellitus, CAD, Skin cancer of face who presents from the cancer center for lethargy and hypotension which resolved on arrival to the ER. She is admitted for abnormal lab work with acute renal failure and hyperkalemia in setting of Lasix use and poor PO intake. According to the patient's friend, lasix was being used to treat pedal edema.    Hospital Course:  Principal Problem:  ARF (acute renal failure)-- Hyperkalemia - prerenal as a result of Poor oral intake along with taking Lasix which was being given for pedal edema - given aggressive IVF hydration- has received close to 6 L of fluid  - have emphasized that she needs to increase her PO intake - she appears to understand but still continues to barely eat and drink-  - hyperkalemia was secondary to ARF and has resolved  - see lab results mentioned below  Active Problems:   Hypoxia - resolved-patient was asymptomatic and etiology was unclear- may have been inaccurate readings vs atelectasis - CXR and VQ done on the day of admission noted to be negative- see reports below  Severe protein calorie malnutrition - added Ensure TID  Left  ear Otitis Media - left ear began to drain pus on 2/13 - have started Ciprodex drops- if no improvement in the next few days, would recommend an ENT consult - pt very hard of hearing   Skin cancer of face - cont oupt radiation treatments to left face  DM - cont insulin - I had to cut back on dose of Lantus as her PO intake has been poor- sugars are stable on this dose- cont to follow   Discharge Exam: Filed Weights   05/16/14 1800 05/18/14 0500  Weight: 68.221 kg (150 lb 6.4 oz) 69.1 kg (152 lb 5.4 oz)   Filed Vitals:   05/18/14 0500  BP: 109/43  Pulse: 69  Temp: 98.5 F (36.9 C)  Resp: 18    General: AAO x 3, no distress- very hard of hearing- left ear draining blood tinged grey fluid- significant amount of discharge noted when looking in the ear with Otoscope- difficult to see tympanic membrane  Cardiovascular: RRR, no murmurs  Respiratory: clear to auscultation bilaterally GI: soft, non-tender, non-distended, bowel sound positive  Discharge Instructions You were cared for by a hospitalist during your hospital stay. If you have any questions about your discharge medications or the care you received while you were in the hospital after you are discharged, you can call the unit and asked to speak with the hospitalist on call if the hospitalist that took care of you is not available. Once you are discharged, your primary care physician will handle any further medical issues. Please note that NO REFILLS for any discharge  medications will be authorized once you are discharged, as it is imperative that you return to your primary care physician (or establish a relationship with a primary care physician if you do not have one) for your aftercare needs so that they can reassess your need for medications and monitor your lab values.      Discharge Instructions    Discharge diet:    Complete by:  As directed   Carb modified, low fat     Increase activity slowly    Complete by:  As  directed             Medication List    STOP taking these medications        furosemide 20 MG tablet  Commonly known as:  LASIX      TAKE these medications        ciprofloxacin-dexamethasone otic suspension  Commonly known as:  CIPRODEX  Place 4 drops into the left ear 2 (two) times daily.     divalproex 125 MG capsule  Commonly known as:  DEPAKOTE SPRINKLE  Take 125 mg by mouth 2 (two) times daily.     erythromycin ophthalmic ointment  Place 1 application into the left eye every 4 (four) hours.     feeding supplement (ENSURE COMPLETE) Liqd  Take 237 mLs by mouth 3 (three) times daily between meals.     gabapentin 300 MG capsule  Commonly known as:  NEURONTIN  Take 300 mg by mouth 2 (two) times daily.     HYDROcodone-acetaminophen 5-325 MG per tablet  Commonly known as:  NORCO/VICODIN  Take 1 tablet by mouth every 6 (six) hours as needed for moderate pain.     insulin glargine 100 UNIT/ML injection  Commonly known as:  LANTUS  Inject 0.08 mLs (8 Units total) into the skin at bedtime.     insulin lispro 100 UNIT/ML KiwkPen  Commonly known as:  HUMALOG  - Inject 0-5 Units into the skin 3 (three) times daily between meals. On sliding scale.   - 0-149 = 0 units  - 150 and above = 5 units     isosorbide mononitrate 30 MG 24 hr tablet  Commonly known as:  IMDUR  Take 30 mg by mouth daily with breakfast.     levothyroxine 25 MCG tablet  Commonly known as:  SYNTHROID, LEVOTHROID  Take 25 mcg by mouth daily before breakfast.     lidocaine 2 % solution  Commonly known as:  XYLOCAINE  Use as directed 10 mLs in the mouth or throat every 6 (six) hours as needed for mouth pain.     lisinopril 5 MG tablet  Commonly known as:  PRINIVIL,ZESTRIL  Take 5 mg by mouth daily with breakfast.     LORazepam 0.5 MG tablet  Commonly known as:  ATIVAN  Take 0.25 mg by mouth 2 (two) times daily as needed for anxiety.     metoprolol tartrate 25 MG tablet  Commonly known as:   LOPRESSOR  Take 25 mg by mouth 2 (two) times daily.     nitroGLYCERIN 0.4 MG SL tablet  Commonly known as:  NITROSTAT  Place 0.4 mg under the tongue every 5 (five) minutes as needed for chest pain.     oxybutynin 10 MG 24 hr tablet  Commonly known as:  DITROPAN-XL  Take 10 mg by mouth every other day.     polyvinyl alcohol 1.4 % ophthalmic solution  Commonly known as:  LIQUIFILM TEARS  Place 2 drops  into the left eye 3 (three) times daily.     simvastatin 20 MG tablet  Commonly known as:  ZOCOR  Take 20 mg by mouth at bedtime.       No Known Allergies    The results of significant diagnostics from this hospitalization (including imaging, microbiology, ancillary and laboratory) are listed below for reference.    Significant Diagnostic Studies: Dg Chest 2 View  05/16/2014   CLINICAL DATA:  Altered mental status.  History of skin cancer.  EXAM: CHEST  2 VIEW  COMPARISON:  04/21/2014; 03/22/2011; chest CT - 04/11/2014  FINDINGS: Grossly unchanged cardiac silhouette and mediastinal contours given persistently reduced lung volumes and patient rotation. Stable position of support apparatus. No focal airspace opacities. No pleural effusion or pneumothorax. No evidence of edema. No acute osseus abnormalities.  IMPRESSION: No acute cardiopulmonary disease on this hypoventilated and rotated examination.   Electronically Signed   By: Sandi Mariscal M.D.   On: 05/16/2014 12:36   Ct Head Wo Contrast  05/16/2014   CLINICAL DATA:  Altered mental status  EXAM: CT HEAD WITHOUT CONTRAST  TECHNIQUE: Contiguous axial images were obtained from the base of the skull through the vertex without intravenous contrast.  COMPARISON:  04/21/2014  FINDINGS: Skull and Sinuses:Status post left parotidectomy. There is no osseous erosion or skullbase foraminal enlargement.  Orbits: No acute abnormality.  Brain: No evidence of acute infarction, hemorrhage, hydrocephalus, or mass lesion/mass effect.  Advanced chronic small  vessel ischemic injury with remote small vessel infarct in the upper right cerebellum and right upper pons. Remote bilateral infarcts of the right caudate head, left caudate body, bilateral putamen, and left thalamus. There is chronic ischemic gliosis in the bilateral cerebral white matter. Generalized cerebral volume loss.  IMPRESSION: 1. No acute intracranial findings. 2. Advanced chronic small vessel disease with remote subcortical and posterior fossa infarcts.   Electronically Signed   By: Monte Fantasia M.D.   On: 05/16/2014 12:46   Nm Pulmonary Perf And Vent  05/16/2014   CLINICAL DATA:  81 year old female with hypoxia. Left chest pacemaker.  EXAM: NUCLEAR MEDICINE VENTILATION - PERFUSION LUNG SCAN  TECHNIQUE: Ventilation images were obtained in multiple projections using inhaled aerosol technetium 99 M DTPA. Perfusion images were obtained in multiple projections after intravenous injection of Tc-73m MAA.  RADIOPHARMACEUTICALS:  Forty-two mCi Tc-89m DTPA aerosol and 5.5 mCi Tc-44m MAA  COMPARISON:  Chest x-ray obtained earlier today 05/16/2014  FINDINGS: Ventilation: No focal ventilation defect.  Perfusion: No wedge shaped peripheral perfusion defects to suggest acute pulmonary embolism. A focal rounded region of decreased radiotracer uptake is noted in the lateral aspect of the left upper to mid lung. This corresponds with the location of the patient's pacemaker.  IMPRESSION: Normal perfusion study.  Negative for pulmonary embolus.   Electronically Signed   By: Jacqulynn Cadet M.D.   On: 05/16/2014 16:44    Microbiology: Recent Results (from the past 240 hour(s))  MRSA PCR Screening     Status: None   Collection Time: 05/16/14  6:19 PM  Result Value Ref Range Status   MRSA by PCR NEGATIVE NEGATIVE Final    Comment:        The GeneXpert MRSA Assay (FDA approved for NASAL specimens only), is one component of a comprehensive MRSA colonization surveillance program. It is not intended to  diagnose MRSA infection nor to guide or monitor treatment for MRSA infections.      Labs: Basic Metabolic Panel:  Recent Labs Lab 05/16/14  1138 05/16/14 1738 05/17/14 0550 05/18/14 0436  NA 140 143 145 143  K 6.2* 6.0* 5.2* 4.5  CL 107 110 112 115*  CO2 20 21 27 23   GLUCOSE 194* 184* 172* 126*  BUN 85* 78* 66* 37*  CREATININE 2.87* 2.56* 1.97* 1.42*  CALCIUM 9.9 9.4 9.2 8.6   Liver Function Tests:  Recent Labs Lab 05/16/14 1138  AST 20  ALT 13  ALKPHOS 80  BILITOT 0.6  PROT 7.9  ALBUMIN 3.6   No results for input(s): LIPASE, AMYLASE in the last 168 hours. No results for input(s): AMMONIA in the last 168 hours. CBC:  Recent Labs Lab 05/16/14 1138 05/17/14 0550  WBC 13.1* 8.6  NEUTROABS 9.1*  --   HGB 12.0 10.0*  HCT 38.8 32.7*  MCV 93.9 94.5  PLT 219 211   Cardiac Enzymes:  Recent Labs Lab 05/16/14 1138  TROPONINI <0.03   BNP: BNP (last 3 results) No results for input(s): BNP in the last 8760 hours.  ProBNP (last 3 results) No results for input(s): PROBNP in the last 8760 hours.  CBG:  Recent Labs Lab 05/17/14 0750 05/17/14 1138 05/17/14 1655 05/17/14 2304 05/18/14 0741  GLUCAP 174* 160* 149* 157* 131*       SignedDebbe Odea, MD [facility] 05/18/2014, 9:53 AM

## 2014-05-18 NOTE — Plan of Care (Signed)
Problem: Consults Goal: Heart Failure Patient Education (See Patient Education module for education specifics.) Outcome: Progressing Packet given - no family c pt this evening - discussed c pt but will need to reinforce with family present

## 2014-05-19 ENCOUNTER — Encounter: Payer: Self-pay | Admitting: *Deleted

## 2014-05-19 ENCOUNTER — Encounter: Payer: Self-pay | Admitting: Radiation Oncology

## 2014-05-19 ENCOUNTER — Ambulatory Visit
Admission: RE | Admit: 2014-05-19 | Discharge: 2014-05-19 | Disposition: A | Discharge status: Home / Self Care | Payer: Medicare Other | Source: Ambulatory Visit | Attending: Radiation Oncology | Admitting: Radiation Oncology

## 2014-05-19 ENCOUNTER — Telehealth: Payer: Self-pay | Admitting: *Deleted

## 2014-05-19 VITALS — BP 99/43 | HR 64 | Temp 97.8°F | Resp 16 | Ht 63.0 in

## 2014-05-19 DIAGNOSIS — C44310 Basal cell carcinoma of skin of unspecified parts of face: Secondary | ICD-10-CM | POA: Diagnosis not present

## 2014-05-19 DIAGNOSIS — Z510 Encounter for antineoplastic radiation therapy: Secondary | ICD-10-CM | POA: Diagnosis not present

## 2014-05-19 DIAGNOSIS — C44300 Unspecified malignant neoplasm of skin of unspecified part of face: Secondary | ICD-10-CM

## 2014-05-19 DIAGNOSIS — N179 Acute kidney failure, unspecified: Secondary | ICD-10-CM | POA: Diagnosis not present

## 2014-05-19 DIAGNOSIS — Z950 Presence of cardiac pacemaker: Secondary | ICD-10-CM | POA: Diagnosis not present

## 2014-05-19 DIAGNOSIS — I2510 Atherosclerotic heart disease of native coronary artery without angina pectoris: Secondary | ICD-10-CM | POA: Diagnosis not present

## 2014-05-19 DIAGNOSIS — R432 Parageusia: Secondary | ICD-10-CM | POA: Diagnosis not present

## 2014-05-19 NOTE — Progress Notes (Addendum)
[name], here in a wheelchair, has completed treatment with 5 fractions to her left neck.  She denies pain and a sore throat.  She reports not being able to taste foods.  She reports a dry mouth.  She reports drinking dietary supplements in between meals but did not have one today. She reports using cipro ear drops.  The skin on her left neck is red.  She is using biafine lotion.  She was not able to stand for a weight today.   BP 99/43 mmHg  Pulse 64  Temp(Src) 97.8 F (36.6 C) (Oral)  Resp 16  Ht 5\' 3"  (1.6 m)  Wt   SpO2 100%

## 2014-05-19 NOTE — Telephone Encounter (Signed)
Called to confirm patient's transport to [facility] for her 11:00 Tomo tmt, spoke with RN Amalia Hailey.  She was not aware of appt, indicated she would call transportation service.  Gayleen Orem, RN, BSN, [place] at [place] [phone]

## 2014-05-19 NOTE — Progress Notes (Signed)
Weekly Management Note:  Outpatient    ICD-9-CM ICD-10-CM   1. Skin cancer of face 173.31 C44.310 0.9 %  sodium chloride infusion     0.9 %  sodium chloride infusion     Basic metabolic panel    Current Dose:  30 Gy  Projected Dose: 30 Gy   Narrative:  The patient presents for routine under treatment assessment.  CBCT/MVCT images/Port film x-rays were reviewed.  The chart was checked. Cyndia Bent, here in a wheelchair, has completed treatment with 5 fractions to her left neck.  She denies pain and a sore throat.  She reports not being able to taste foods.  She reports a dry mouth.  She reports drinking dietary supplements in between meals but did not have one today.  She was not able to stand for a weight today.  She was discharged from the hospital this weekend after admission for hypotension. Her hypotension was appreciated in rad/onc and tx was held on Friday last week. ARF was diagnosed and she received IVF. Instructed to hold Lasix on discharge.  Her PO intake remains poor due to dysguesia but few other complaints.  BP 99/43 mmHg  Pulse 64  Temp(Src) 97.8 F (36.6 C) (Oral)  Resp 16  Ht 5\' 3"  (1.6 m)  Wt   SpO2 100%      Physical Findings:  Wt Readings from Last 3 Encounters:  05/18/14 152 lb 5.4 oz (69.1 kg)  04/28/14 163 lb (73.936 kg)  04/22/14 168 lb 14 oz (76.6 kg)    height is 5\' 3"  (1.6 m). Her oral temperature is 97.8 F (36.6 C). Her blood pressure is 99/43 and her pulse is 64. Her respiration is 16 and oxygen saturation is 100%.  No mucositis.  Dry mucosa in mouth.  Skin over left neck is dry, with moist desquamation in left ear c/w radiation changes. She is alone; poor historian. In good spirits. No ankle edema.  CBC    Component Value Date/Time   WBC 8.6 05/17/2014 0550   RBC 3.46* 05/17/2014 0550   HGB 10.0* 05/17/2014 0550   HCT 32.7* 05/17/2014 0550   PLT 211 05/17/2014 0550   MCV 94.5 05/17/2014 0550   MCH 28.9 05/17/2014 0550   MCHC 30.6  05/17/2014 0550   RDW 14.9 05/17/2014 0550   LYMPHSABS 2.2 05/16/2014 1138   MONOABS 1.4* 05/16/2014 1138   EOSABS 0.4 05/16/2014 1138   BASOSABS 0.1 05/16/2014 1138     CMP     Component Value Date/Time   NA 143 05/18/2014 0436   K 4.5 05/18/2014 0436   CL 115* 05/18/2014 0436   CO2 23 05/18/2014 0436   GLUCOSE 126* 05/18/2014 0436   BUN 37* 05/18/2014 0436   CREATININE 1.42* 05/18/2014 0436   CALCIUM 8.6 05/18/2014 0436   PROT 7.9 05/16/2014 1138   ALBUMIN 3.6 05/16/2014 1138   AST 20 05/16/2014 1138   ALT 13 05/16/2014 1138   ALKPHOS 80 05/16/2014 1138   BILITOT 0.6 05/16/2014 1138   GFRNONAA 33* 05/18/2014 0436   GFRAA 38* 05/18/2014 0436     Impression:  The patient  tolerated radiotherapy. Poor PO intake.  Plan:   Will give IVF twice this week (Wed and Friday). Due to frail patient status I will ask for my partner to see her for f/u next week, on 2/22, after a STAT BMP.  At that time, he can assess patient for fluid status and whether she needs more IVF.  She has  CHF and thus I'm ordering her IVF to be given at 250 cc /hr x 4 hours per session.  I reminded [name] to hold her Lasix until PO intake improves.  -----------------------------------  Eppie Gibson, MD

## 2014-05-19 NOTE — Telephone Encounter (Signed)
[name] called to notify she would not be able to join [place] for her appt d/t road conditions near her home.  She indicated she would call [facility] to let her know.  Gayleen Orem, RN, BSN, [place] at [place] [phone]

## 2014-05-19 NOTE — Progress Notes (Signed)
Met with pt during final RT to offer support and to celebrate end of radiation treatment, met with patient during weekly UT with Dr. Isidore Moos. Patient verbalized understanding she will receive IV fluids twice this week.  I will confirm appts with [facility] and her friend [name].    Gayleen Orem, RN, BSN, [place] at [place] [phone]

## 2014-05-19 NOTE — Progress Notes (Signed)
Weekly rad txs parotid, started cipro 05/18/14 2x day,, patient incontinent,  erythromycin ointment ,back at [facility],

## 2014-05-20 ENCOUNTER — Non-Acute Institutional Stay (SKILLED_NURSING_FACILITY): Payer: Medicare Other | Admitting: Internal Medicine

## 2014-05-20 ENCOUNTER — Telehealth: Payer: Self-pay | Admitting: *Deleted

## 2014-05-20 ENCOUNTER — Encounter: Payer: Self-pay | Admitting: Internal Medicine

## 2014-05-20 DIAGNOSIS — N179 Acute kidney failure, unspecified: Secondary | ICD-10-CM

## 2014-05-20 DIAGNOSIS — Z794 Long term (current) use of insulin: Secondary | ICD-10-CM | POA: Diagnosis not present

## 2014-05-20 DIAGNOSIS — E119 Type 2 diabetes mellitus without complications: Secondary | ICD-10-CM | POA: Diagnosis not present

## 2014-05-20 DIAGNOSIS — C44310 Basal cell carcinoma of skin of unspecified parts of face: Secondary | ICD-10-CM

## 2014-05-20 DIAGNOSIS — E43 Unspecified severe protein-calorie malnutrition: Secondary | ICD-10-CM

## 2014-05-20 DIAGNOSIS — H66002 Acute suppurative otitis media without spontaneous rupture of ear drum, left ear: Secondary | ICD-10-CM | POA: Diagnosis not present

## 2014-05-20 DIAGNOSIS — R0902 Hypoxemia: Secondary | ICD-10-CM

## 2014-05-20 DIAGNOSIS — C44300 Unspecified malignant neoplasm of skin of unspecified part of face: Secondary | ICD-10-CM

## 2014-05-20 DIAGNOSIS — IMO0001 Reserved for inherently not codable concepts without codable children: Secondary | ICD-10-CM | POA: Insufficient documentation

## 2014-05-20 DIAGNOSIS — E875 Hyperkalemia: Secondary | ICD-10-CM | POA: Diagnosis not present

## 2014-05-20 NOTE — Progress Notes (Signed)
Patient ID: [name], female   DOB: [birth year], 81 y.o.   MRN: [id number]    HISTORY AND PHYSICAL  Location:  [facility] of Service: SNF [phone])   Extended Emergency Contact Information Primary Emergency Contact: [place], [place] [address] Johnnette Litter of [place] Phone: [phone] Mobile Phone: [phone] Relation: Friend Secondary Emergency Contact: Gwyndolyn Saxon [place] of [place] Phone: [phone] Relation: Other  Advanced Directive information  DNR  Chief Complaint  Patient presents with  . 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  She is currently using ciprodex gtts into left ear but is still experiencing a d/c. She completed XRT on yesterday for facial skin cancer. She was told that she may have ear drainage x 1 month following XRT completion. Her appetite is still poor. No N/V. Occasional issues with dysphagia.no f/c, night sweats, CP or SOB. No palpitations. No nursing issues. She is sleeping well.  Heart failure is controlled on imdur and lisinopril. No new swelling. No hypoxia since d/c. Her BP is controlled on lisinopril. No anginal sx's. Has prn SLNTG.  She takes prn ativan for anxiety. No anxiety attacks recently  Thyroid is stable on current dose of levothyroxine.  CBGs stable on humalog SSI and nightly lantus. No low BS reactions.    Past Medical History  Diagnosis Date  . Coronary artery disease     multiple PCI; echo 09/15/10-EF45-50%, aortic valve sclerosis without stenosis; myoview 03/23/11-no reversible defects, EF66%  . Hypertension   . Pacemaker 09/2010    sinus node dysfunction, sinus bradycardia  . Hyperlipidemia   .  Hypothyroidism   . Carotid bruit 10/25/2002    carotid doppler- normal  . Myocardial infarction   . Chronic kidney disease     stage IV renal disease  . 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 Osteopenia   . COPD (chronic obstructive pulmonary disease)   . Stroke about 20 years ago     1998  . Skin cancer     ermoved from left side of face  . Syncope and collapse   . Localized osteoarthrosis, lower leg   . Generalized anxiety disorder   . Mild mood disorder   . Other specified malignant neoplasm of skin of other parts of face   . Other specified malignant neoplasm of skin of other parts of face   . Hypothyroidism, adult   . CHF (congestive heart failure)   . Insulin dependent diabetes mellitus     fasting cbg 160s  . Diabetes mellitus type II, controlled, with no complications   . History of radiation therapy 04/30/14- 05/19/14    skin of face  . Second degree AV block 06/02/2014    Past Surgical History  Procedure Laterality Date  . Insert / replace / remove pacemaker  09/21/10    medtronic Adapta   . Coronary angioplasty with stent placement  09/13/10    preserved LV function, PTCA/stenting of the RCA with insertion of two stents, 2.75x41m stent proximal RCA and 2.25x273mat the distal RCA, post stent dilatation up to 2.97m25m. Cardiac catheterization  05/28/10    EF 65%,  medical therapy  . Coronary angioplasty with stent placement  06/04/09    PCI, stenting of the mid and prox RCA with Promus DES  . Cardiac catheterization  06/03/09    PCI, stenting of the major oblique marginal arteries off the circ  . Coronary angioplasty with stent placement  10/16/2002    stenting of mid RCA with 2.75x91m Cypher stent deployed at 16 atmospheres; EF 60  . Coronary angioplasty with stent placement  11/29/2001    2.75x136mcutting balloon angioplasty of the in stent restenosis of RCA, bracytherapy of the in stend restenotic segment of mid RCA  . Cardiac catheterization  11/28/2001    high grade isten  restenosis of RCA stent placed 03/02/01, proceed with cutting balloon 8/28  . Coronary angioplasty with stent placement  03/02/2001    ptca and stenting of RCA (2.75x2321meta) after predilatation with a 2.5x20m70mossSail and OpenSail balloon; EF 60%  . Tumor removal      on side of neck  . Parotidectomy Left 03/14/2014    Procedure: LEFT PAROTIDECTOMY WITH FACIAL NERVE DISSECTION ;  Surgeon: ChriRozetta Nunnery;  [facility];  Laterality: Left;  Total parotidectomy with facial nerve dissection, limited LEFT nerve dissection, LEFT tarsorrhaphy  . Radical neck dissection Left 03/14/2014    Procedure: LEFT NECK DISSECTION;  Surgeon: ChriRozetta Nunnery;  [facility];  Laterality: Left;    Patient Care Team: Hal Lajean Manes as PCP - General (Internal Medicine) SaraEppie Gibson as Attending Physician (Radiation Oncology) RichLeota Sauers as Oncology Nurse Navigator  History   Social History  . Marital Status: Single    Spouse Name: N/A  . Number of Children: N/A  . Years of Education: N/A   Occupational History  . Not on file.   Social History Main Topics  . Smoking status: Former Smoker -- 0.50 packs/day    Quit date: 02/15/2012  . Smokeless tobacco: Not on file  . Alcohol Use: No  . Drug Use: No  . Sexual Activity: No   Other Topics Concern  . Not on file   Social History Narrative   ** Merged History Encounter **       Patient lives alone at home.   Next of kin and have her power of attorney is MaryArliss Journeyphone number listed     reports that she quit smoking about 2 years ago. She does not have any smokeless tobacco history on file. She reports that she does not drink alcohol or use illicit drugs.  Family History  Problem Relation Age of Onset  . Asthma Mother   . Stroke Mother   . Diabetes Father   . Heart failure Father    Family Status  Relation Status Death Age  . Mother Deceased   . Father Deceased    . Sister Deceased   . Sister Deceased      There is no immunization history on file for this patient.  No Known Allergies  Medications: Patient's Medications  New Prescriptions   No medications on file  Previous Medications   CIPROFLOXACIN-DEXAMETHASONE (CIPRODEX) OTIC SUSPENSION    Place 4 drops into the left ear 2 (two) times daily.   DIVALPROEX (DEPAKOTE SPRINKLE) 125 MG CAPSULE    Take 125 mg by mouth 2 (two) times daily.   ERYTHROMYCIN OPHTHALMIC OINTMENT    Place 1 application into the left eye every 4 (four) hours while awake.  FEEDING SUPPLEMENT, ENSURE COMPLETE, (ENSURE COMPLETE) LIQD    Take 237 mLs by mouth 3 (three) times daily between meals.   GABAPENTIN (NEURONTIN) 300 MG CAPSULE    Take 300 mg by mouth 2 (two) times daily.    HYDROCODONE-ACETAMINOPHEN (NORCO/VICODIN) 5-325 MG PER TABLET    Take 1 tablet by mouth every 6 (six) hours as needed for moderate pain.   INSULIN GLARGINE (LANTUS) 100 UNIT/ML INJECTION    Inject 0.08 mLs (8 Units total) into the skin at bedtime.   INSULIN LISPRO (HUMALOG) 100 UNIT/ML KIWKPEN    Inject 0-5 Units into the skin 3 (three) times daily between meals. On sliding scale.  0-149 = 0 units 150 and above = 5 units   ISOSORBIDE MONONITRATE (IMDUR) 30 MG 24 HR TABLET    Take 1 tablet (30 mg total) by mouth daily.   LEVOTHYROXINE (SYNTHROID, LEVOTHROID) 25 MCG TABLET    Take 25 mcg by mouth daily.     LIDOCAINE (XYLOCAINE) 2 % SOLUTION    Caregiver: Mix 1part 2% viscous lidocaine, 1part H20. Swish and/or swallow 36m of this mixture, 311m before meals and at bedtime, up to QID. Only swish in mouth, if mouth is sore but throat is not sore.   LISINOPRIL (PRINIVIL,ZESTRIL) 5 MG TABLET    Take 5 mg by mouth daily.   LORAZEPAM (ATIVAN) 0.5 MG TABLET    Take 0.25 mg by mouth 2 (two) times daily as needed for anxiety.   METOPROLOL TARTRATE (LOPRESSOR) 25 MG TABLET    Take 25 mg by mouth 2 (two) times daily.     NITROGLYCERIN (NITROSTAT) 0.4 MG SL  TABLET    Place 1 tablet (0.4 mg total) under the tongue every 5 (five) minutes as needed. Chest pain   OXYBUTYNIN (DITROPAN-XL) 10 MG 24 HR TABLET    Take 10 mg by mouth every other day.   POLYVINYL ALCOHOL (LIQUIFILM TEARS) 1.4 % OPHTHALMIC SOLUTION    Place 2 drops into the left eye 3 (three) times daily.   SIMVASTATIN (ZOCOR) 20 MG TABLET    Take 20 mg by mouth daily.   TRIAMCINOLONE (KENALOG) 0.025 % CREAM    Apply 1 application topically 2 (two) times daily. Apply to legs twice a day for 10 days  Modified Medications   No medications on file  Discontinued Medications   ERYTHROMYCIN OPHTHALMIC OINTMENT    Place 1 application into the left eye every 4 (four) hours.   FUROSEMIDE (LASIX) 40 MG TABLET    Take 0.5 tablets (20 mg total) by mouth every other day.   GABAPENTIN (NEURONTIN) 300 MG CAPSULE    Take 300 mg by mouth 2 (two) times daily.   HYDROCODONE-ACETAMINOPHEN (NORCO/VICODIN) 5-325 MG PER TABLET    Take one tablet by mouth every 6 hours as needed for pain. Not to exceed 300077mf APAP from all sources/24h   INSULIN GLARGINE (LANTUS) 100 UNIT/ML INJECTION    Inject 0.14 mLs (14 Units total) into the skin at bedtime.   INSULIN LISPRO (HUMALOG) 100 UNIT/ML INJECTION    Inject 0.05 mLs (5 Units total) into the skin 3 (three) times daily before meals. For cbg >=150   ISOSORBIDE MONONITRATE (IMDUR) 30 MG 24 HR TABLET    Take 30 mg by mouth daily with breakfast.   LEVOTHYROXINE (SYNTHROID, LEVOTHROID) 25 MCG TABLET    Take 25 mcg by mouth daily before breakfast.   LIDOCAINE (XYLOCAINE) 2 % SOLUTION    Use as directed 10 mLs in  the mouth or throat every 6 (six) hours as needed for mouth pain.   LISINOPRIL (PRINIVIL,ZESTRIL) 5 MG TABLET    Take 5 mg by mouth daily with breakfast.   METOPROLOL TARTRATE (LOPRESSOR) 25 MG TABLET    Take 25 mg by mouth 2 (two) times daily.   NITROGLYCERIN (NITROSTAT) 0.4 MG SL TABLET    Place 0.4 mg under the tongue every 5 (five) minutes as needed for chest pain.     OXYBUTYNIN (DITROPAN-XL) 10 MG 24 HR TABLET    Take 10 mg by mouth every other day.   POLYVINYL ALCOHOL (LIQUIFILM TEARS) 1.4 % OPHTHALMIC SOLUTION    Place 2 drops into the left eye 3 (three) times daily.   SIMVASTATIN (ZOCOR) 20 MG TABLET    Take 20 mg by mouth at bedtime.    SIMVASTATIN (ZOCOR) 20 MG TABLET    Take 20 mg by mouth at bedtime.    Review of Systems  Constitutional: Positive for appetite change and fatigue. Negative for fever, chills, diaphoresis and activity change.  HENT: Positive for ear discharge and trouble swallowing. Negative for ear pain and sore throat.   Eyes: Negative for visual disturbance.  Respiratory: Negative for cough, chest tightness and shortness of breath.   Cardiovascular: Negative for chest pain, palpitations and leg swelling.  Gastrointestinal: Negative for nausea, vomiting, abdominal pain, diarrhea, constipation and blood in stool.  Genitourinary: Negative for dysuria.  Musculoskeletal: Negative for arthralgias.  Neurological: Positive for weakness. Negative for dizziness, tremors, numbness and headaches.  Psychiatric/Behavioral: Negative for sleep disturbance. The patient is nervous/anxious.     Filed Vitals:   05/20/14 1421  BP: 134/66  Pulse: 78  Temp: 97.2 F (36.2 C)  Weight: 193 lb 12.8 oz (87.907 kg)   Body mass index is 34.34 kg/(m^2).  Physical Exam  Constitutional: She is oriented to person, place, and time. No distress.  Frail appearing in NAD. Awake and alert  HENT:  Right Ear: Hearing, tympanic membrane, external ear and ear canal normal.  Left Ear: There is drainage (+)otitis externa, swelling and tenderness. No foreign bodies. No mastoid tenderness.  Mouth/Throat: Oropharynx is clear and moist. Mucous membranes are dry. No oropharyngeal exudate.  Eyes: No scleral icterus. Right pupil is reactive.  Left eyelid closed  Neck: Neck supple. No tracheal deviation present. No thyromegaly present.  Cardiovascular: Normal rate,  regular rhythm and intact distal pulses.  Exam reveals no gallop and no friction rub.   Murmur (1/6 SEM) heard. Pulses:      Carotid pulses are on the right side with bruit. No LE edema b/l. no calf TTP.   Pulmonary/Chest: Effort normal and breath sounds normal. No stridor. No respiratory distress. She has no wheezes. She has no rales.  Abdominal: Soft. Bowel sounds are normal. She exhibits no distension and no mass. There is no tenderness. There is no rebound and no guarding.  Lymphadenopathy:    She has no cervical adenopathy.  Neurological: She is alert and oriented to person, place, and time. She has normal reflexes.  Skin: Skin is warm and dry. No rash noted.  Psychiatric: She has a normal mood and affect. Her behavior is normal. Judgment and thought content normal.     Labs reviewed: Admission on 05/16/2014, Discharged on 05/18/2014  Component Date Value Ref Range Status  . WBC 05/16/2014 13.1* 4.0 - 10.5 K/uL Final  . RBC 05/16/2014 4.13  3.87 - 5.11 MIL/uL Final  . Hemoglobin 05/16/2014 12.0  12.0 - 15.0 g/dL  Final  . HCT 05/16/2014 38.8  36.0 - 46.0 % Final  . MCV 05/16/2014 93.9  78.0 - 100.0 fL Final  . MCH 05/16/2014 29.1  26.0 - 34.0 pg Final  . MCHC 05/16/2014 30.9  30.0 - 36.0 g/dL Final  . RDW 05/16/2014 15.0  11.5 - 15.5 % Final  . Platelets 05/16/2014 219  150 - 400 K/uL Final  . Neutrophils Relative % 05/16/2014 69  43 - 77 % Final  . Neutro Abs 05/16/2014 9.1* 1.7 - 7.7 K/uL Final  . Lymphocytes Relative 05/16/2014 17  12 - 46 % Final  . Lymphs Abs 05/16/2014 2.2  0.7 - 4.0 K/uL Final  . Monocytes Relative 05/16/2014 10  3 - 12 % Final  . Monocytes Absolute 05/16/2014 1.4* 0.1 - 1.0 K/uL Final  . Eosinophils Relative 05/16/2014 3  0 - 5 % Final  . Eosinophils Absolute 05/16/2014 0.4  0.0 - 0.7 K/uL Final  . Basophils Relative 05/16/2014 1  0 - 1 % Final  . Basophils Absolute 05/16/2014 0.1  0.0 - 0.1 K/uL Final  . Sodium 05/16/2014 140  135 - 145 mmol/L Final    . Potassium 05/16/2014 6.2* 3.5 - 5.1 mmol/L Final   Comment: NO VISIBLE HEMOLYSIS REPEATED TO VERIFY CRITICAL RESULT CALLED TO, READ BACK BY AND VERIFIED WITH: BINGHAM,S. RN AT 8016 05/16/14 MULLINS,T   . Chloride 05/16/2014 107  96 - 112 mmol/L Final  . CO2 05/16/2014 20  19 - 32 mmol/L Final  . Glucose, Bld 05/16/2014 194* 70 - 99 mg/dL Final  . BUN 05/16/2014 85* 6 - 23 mg/dL Final  . Creatinine, Ser 05/16/2014 2.87* 0.50 - 1.10 mg/dL Final  . Calcium 05/16/2014 9.9  8.4 - 10.5 mg/dL Final  . Total Protein 05/16/2014 7.9  6.0 - 8.3 g/dL Final  . Albumin 05/16/2014 3.6  3.5 - 5.2 g/dL Final  . AST 05/16/2014 20  0 - 37 U/L Final  . ALT 05/16/2014 13  0 - 35 U/L Final  . Alkaline Phosphatase 05/16/2014 80  39 - 117 U/L Final  . Total Bilirubin 05/16/2014 0.6  0.3 - 1.2 mg/dL Final  . GFR calc non Af Amer 05/16/2014 14* >90 mL/min Final  . GFR calc Af Amer 05/16/2014 16* >90 mL/min Final   Comment: (NOTE) The eGFR has been calculated using the CKD EPI equation. This calculation has not been validated in all clinical situations. eGFR's persistently <90 mL/min signify possible Chronic Kidney Disease.   . Anion gap 05/16/2014 13  5 - 15 Final  . Troponin I 05/16/2014 <0.03  <0.031 ng/mL Final   Comment:        NO INDICATION OF MYOCARDIAL INJURY.   . Prothrombin Time 05/16/2014 14.4  11.6 - 15.2 seconds Final  . INR 05/16/2014 1.10  0.00 - 1.49 Final  . Glucose-Capillary 05/16/2014 167* 70 - 99 mg/dL Final  . Sodium 05/16/2014 143  135 - 145 mmol/L Final  . Potassium 05/16/2014 6.0* 3.5 - 5.1 mmol/L Final  . Chloride 05/16/2014 110  96 - 112 mmol/L Final  . CO2 05/16/2014 21  19 - 32 mmol/L Final  . Glucose, Bld 05/16/2014 184* 70 - 99 mg/dL Final  . BUN 05/16/2014 78* 6 - 23 mg/dL Final  . Creatinine, Ser 05/16/2014 2.56* 0.50 - 1.10 mg/dL Final  . Calcium 05/16/2014 9.4  8.4 - 10.5 mg/dL Final  . GFR calc non Af Amer 05/16/2014 16* >90 mL/min Final  . GFR calc Af Wyvonnia Lora  05/16/2014 19* >90 mL/min Final   Comment: (NOTE) The eGFR has been calculated using the CKD EPI equation. This calculation has not been validated in all clinical situations. eGFR's persistently <90 mL/min signify possible Chronic Kidney Disease.   . Anion gap 05/16/2014 12  5 - 15 Final  . Sodium 05/17/2014 145  135 - 145 mmol/L Final  . Potassium 05/17/2014 5.2* 3.5 - 5.1 mmol/L Final  . Chloride 05/17/2014 112  96 - 112 mmol/L Final  . CO2 05/17/2014 27  19 - 32 mmol/L Final  . Glucose, Bld 05/17/2014 172* 70 - 99 mg/dL Final  . BUN 05/17/2014 66* 6 - 23 mg/dL Final  . Creatinine, Ser 05/17/2014 1.97* 0.50 - 1.10 mg/dL Final  . Calcium 05/17/2014 9.2  8.4 - 10.5 mg/dL Final  . GFR calc non Af Amer 05/17/2014 22* >90 mL/min Final  . GFR calc Af Amer 05/17/2014 26* >90 mL/min Final   Comment: (NOTE) The eGFR has been calculated using the CKD EPI equation. This calculation has not been validated in all clinical situations. eGFR's persistently <90 mL/min signify possible Chronic Kidney Disease.   . Anion gap 05/17/2014 6  5 - 15 Final  . WBC 05/17/2014 8.6  4.0 - 10.5 K/uL Final  . RBC 05/17/2014 3.46* 3.87 - 5.11 MIL/uL Final  . Hemoglobin 05/17/2014 10.0* 12.0 - 15.0 g/dL Final  . HCT 05/17/2014 32.7* 36.0 - 46.0 % Final  . MCV 05/17/2014 94.5  78.0 - 100.0 fL Final  . MCH 05/17/2014 28.9  26.0 - 34.0 pg Final  . MCHC 05/17/2014 30.6  30.0 - 36.0 g/dL Final  . RDW 05/17/2014 14.9  11.5 - 15.5 % Final  . Platelets 05/17/2014 211  150 - 400 K/uL Final  . Glucose-Capillary 05/16/2014 148* 70 - 99 mg/dL Final  . MRSA by PCR 05/16/2014 NEGATIVE  NEGATIVE Final   Comment:        The GeneXpert MRSA Assay (FDA approved for NASAL specimens only), is one component of a comprehensive MRSA colonization surveillance program. It is not intended to diagnose MRSA infection nor to guide or monitor treatment for MRSA infections.   . Glucose-Capillary 05/16/2014 91  70 - 99 mg/dL Final    . Glucose-Capillary 05/17/2014 174* 70 - 99 mg/dL Final  . Glucose-Capillary 05/17/2014 160* 70 - 99 mg/dL Final  . Sodium 05/18/2014 143  135 - 145 mmol/L Final  . Potassium 05/18/2014 4.5  3.5 - 5.1 mmol/L Final  . Chloride 05/18/2014 115* 96 - 112 mmol/L Final  . CO2 05/18/2014 23  19 - 32 mmol/L Final  . Glucose, Bld 05/18/2014 126* 70 - 99 mg/dL Final  . BUN 05/18/2014 37* 6 - 23 mg/dL Final   Comment: DELTA CHECK NOTED REPEATED TO VERIFY   . Creatinine, Ser 05/18/2014 1.42* 0.50 - 1.10 mg/dL Final  . Calcium 05/18/2014 8.6  8.4 - 10.5 mg/dL Final  . GFR calc non Af Amer 05/18/2014 33* >90 mL/min Final  . GFR calc Af Amer 05/18/2014 38* >90 mL/min Final   Comment: (NOTE) The eGFR has been calculated using the CKD EPI equation. This calculation has not been validated in all clinical situations. eGFR's persistently <90 mL/min signify possible Chronic Kidney Disease.   . Anion gap 05/18/2014 5  5 - 15 Final  . Glucose-Capillary 05/17/2014 149* 70 - 99 mg/dL Final  . Glucose-Capillary 05/17/2014 157* 70 - 99 mg/dL Final  . Comment 1 05/17/2014 Notify RN   Final  . Glucose-Capillary 05/18/2014 131*  70 - 99 mg/dL Final  . Glucose-Capillary 05/18/2014 148* 70 - 99 mg/dL Final    Dg Chest 2 View  05/16/2014   CLINICAL DATA:  Altered mental status.  History of skin cancer.  EXAM: CHEST  2 VIEW  COMPARISON:  04/21/2014; 03/22/2011; chest CT - 04/11/2014  FINDINGS: Grossly unchanged cardiac silhouette and mediastinal contours given persistently reduced lung volumes and patient rotation. Stable position of support apparatus. No focal airspace opacities. No pleural effusion or pneumothorax. No evidence of edema. No acute osseus abnormalities.  IMPRESSION: No acute cardiopulmonary disease on this hypoventilated and rotated examination.   Electronically Signed   By: Sandi Mariscal M.D.   On: 05/16/2014 12:36   Dg Chest 2 View  04/21/2014   CLINICAL DATA:  Patient on antibiotic therapy for ear  infection. Altered mental status. History of congestive heart failure.  EXAM: CHEST  2 VIEW  COMPARISON:  03/22/2011  FINDINGS: Multi lead pacer apparatus overlies the left hemi thorax, leads are stable in position. Stable cardiac and mediastinal contours, mildly enlarged. No large consolidative pulmonary opacities. No pleural effusion or pneumothorax. Mid thoracic spine degenerative change.  IMPRESSION: No acute cardiopulmonary process.   Electronically Signed   By: Lovey Newcomer M.D.   On: 04/21/2014 14:09   Ct Head Wo Contrast  05/16/2014   CLINICAL DATA:  Altered mental status  EXAM: CT HEAD WITHOUT CONTRAST  TECHNIQUE: Contiguous axial images were obtained from the base of the skull through the vertex without intravenous contrast.  COMPARISON:  04/21/2014  FINDINGS: Skull and Sinuses:Status post left parotidectomy. There is no osseous erosion or skullbase foraminal enlargement.  Orbits: No acute abnormality.  Brain: No evidence of acute infarction, hemorrhage, hydrocephalus, or mass lesion/mass effect.  Advanced chronic small vessel ischemic injury with remote small vessel infarct in the upper right cerebellum and right upper pons. Remote bilateral infarcts of the right caudate head, left caudate body, bilateral putamen, and left thalamus. There is chronic ischemic gliosis in the bilateral cerebral white matter. Generalized cerebral volume loss.  IMPRESSION: 1. No acute intracranial findings. 2. Advanced chronic small vessel disease with remote subcortical and posterior fossa infarcts.   Electronically Signed   By: Monte Fantasia M.D.   On: 05/16/2014 12:46   Ct Head Wo Contrast  04/21/2014   CLINICAL DATA:  81 year old female with syncopal episode today. Current planning radiation therapy for left facial skin cancer. Hypertension. Hyperlipidemia. Pacemaker. Initial encounter.  EXAM: CT HEAD WITHOUT CONTRAST  TECHNIQUE: Contiguous axial images were obtained from the base of the skull through the vertex  without intravenous contrast.  COMPARISON:  02/15/2012 head CT.  FINDINGS: No intracranial hemorrhage.  Remote infarcts involving mid left coronal radiata/left lenticular nucleus, right caudate head, right aspect of the pons and right cerebellum. Small vessel disease type changes. No CT evidence of large acute infarct.  Global atrophy without hydrocephalus.  No intracranial mass lesion noted on this unenhanced exam.  Prior left parotid gland resection.  Partial opacification inferior left mastoid air cells.  Vascular calcifications.  IMPRESSION: Remote infarcts and small vessel disease type changes without CT evidence of large acute infarct.  No intracranial hemorrhage.   Electronically Signed   By: Chauncey Cruel M.D.   On: 04/21/2014 17:32   Nm Pulmonary Perf And Vent  05/16/2014   CLINICAL DATA:  81 year old female with hypoxia. Left chest pacemaker.  EXAM: NUCLEAR MEDICINE VENTILATION - PERFUSION LUNG SCAN  TECHNIQUE: Ventilation images were obtained in multiple projections using inhaled aerosol  technetium 14 M DTPA. Perfusion images were obtained in multiple projections after intravenous injection of Tc-21mMAA.  RADIOPHARMACEUTICALS:  Forty-two mCi Tc-934mTPA aerosol and 5.5 mCi Tc-9931mA  COMPARISON:  Chest x-ray obtained earlier today 05/16/2014  FINDINGS: Ventilation: No focal ventilation defect.  Perfusion: No wedge shaped peripheral perfusion defects to suggest acute pulmonary embolism. A focal rounded region of decreased radiotracer uptake is noted in the lateral aspect of the left upper to mid lung. This corresponds with the location of the patient's pacemaker.  IMPRESSION: Normal perfusion study.  Negative for pulmonary embolus.   Electronically Signed   By: HeaJacqulynn CadetD.   On: 05/16/2014 16:44   Hospital records reviewed. She was told to get an ENT referral if ear does not improve. TED stockings for swelling. CT head showed no acute infarcts but remote infarcts noted. Cr 1.42 and K 4.5  at d/c  Assessment/Plan .   ICD-9-CM ICD-10-CM   1. Acute suppurative otitis media of left ear without spontaneous rupture of tympanic membrane, recurrence not specified - improving on ciprodex gtss 382.00 H66.002   2. Acute renal failure, unspecified acute renal failure type - improving 584.9 N17.9   3. Hyperkalemia - resolved 276.7 E87.5   4. Hypoxia - resolved 799.02 R09.02   5. Skin cancer of face - s/p XRT 173.31 C44.310   6. Protein-calorie malnutrition, severe - stable; continue nutritional supplements 262 E43   7. Insulin dependent diabetes mellitus - stable on lantus and SSI 250.00 E11.9    V58.67 Z79.4     --BMP in 1 week  --apply TED stockings for edema  --refer to ENT if ear does not improve in the next month  --continue other medications as ordered  --f/u with cardiology for pacer interrogation, radiation oncology Dr SquLanell Persons scheduled  --OT/ST as indicated  --will follow  --GOAL: short term rehab and d/c home when medically appropriate. Communicated with pt and nursing.   Sheresa Cullop S. CarPerlie [facility]ie[facility] [address] [place] [phone] (Wednesdays and Fridays 8 AM - 5 PM) [phone] (Monday-Friday 8 AM - 5 PM)

## 2014-05-20 NOTE — Telephone Encounter (Signed)
Per Stanton Kidney from radiation I have scheduled appts. She will contact the patient

## 2014-05-20 NOTE — Telephone Encounter (Signed)
Spoke with Joycelyn Schmid at [facility], informed her of patient's 10:00 Wednesday and 1:30 Friday IVF appointments, asked that patient arrive at [facility] 15 minutes prior to appt times.  She verbalized understanding.  Gayleen Orem, RN, BSN, [place] at [place] [phone]

## 2014-05-20 NOTE — Telephone Encounter (Signed)
CALLED [facility] TO INFORM OF LAB AND FU VISIT ON 05-26-14, SPOKE WITH VICKIE @ [facility] AND ARRANGED THESE APPTS., THEY WILL TAKE CARE OF GETTING THIS PATIENT HERE TO GET THESE APPTS. DONE

## 2014-05-20 NOTE — Telephone Encounter (Signed)
LVM for patient's friend, [name], informing her of tomorrow and Friday's appts for IVF and next Monday's appt for labs and f/u with Dr. Valere Dross.  Gayleen Orem, RN, BSN, [place] at [place] [phone]

## 2014-05-21 ENCOUNTER — Ambulatory Visit: Payer: Medicare Other

## 2014-05-21 VITALS — BP 127/50 | HR 66 | Temp 97.6°F | Resp 16

## 2014-05-21 DIAGNOSIS — C44300 Unspecified malignant neoplasm of skin of unspecified part of face: Secondary | ICD-10-CM

## 2014-05-21 MED ORDER — SODIUM CHLORIDE 0.9 % IV SOLN
1000.0000 mL | INTRAVENOUS | Status: AC
  Administered 2014-05-21: 10:00:00 via INTRAVENOUS

## 2014-05-21 NOTE — Progress Notes (Signed)
  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  She did have signs of poor PO intake and dehydration / ARF. It was unclear how much of this was due to radiotherapy vs distaste for the diabetic diet at her nursing home. She was admitted to the hospital for management. IV fluids were given on an inpatient and outpatient basis. Dr. Isidore Moos wrote a note to the nursing home imploring the staff to be more lenient on her nutritional / beverage options given her dehydration.  Plan: The patient has completed radiation treatment. The patient will return to radiation oncology clinic for routine followup in one week. I advised them to call or return sooner if they have any questions or concerns related to their recovery or treatment.  -----------------------------------  Eppie Gibson, MD

## 2014-05-23 ENCOUNTER — Other Ambulatory Visit: Payer: Self-pay | Admitting: *Deleted

## 2014-05-23 ENCOUNTER — Other Ambulatory Visit: Payer: Self-pay | Admitting: Internal Medicine

## 2014-05-23 ENCOUNTER — Ambulatory Visit: Payer: Medicare Other

## 2014-05-23 ENCOUNTER — Non-Acute Institutional Stay (SKILLED_NURSING_FACILITY): Payer: Medicare Other | Admitting: Adult Health

## 2014-05-23 ENCOUNTER — Telehealth: Payer: Self-pay | Admitting: *Deleted

## 2014-05-23 VITALS — BP 124/59 | HR 72 | Resp 18

## 2014-05-23 DIAGNOSIS — C44300 Unspecified malignant neoplasm of skin of unspecified part of face: Secondary | ICD-10-CM

## 2014-05-23 DIAGNOSIS — R630 Anorexia: Secondary | ICD-10-CM | POA: Diagnosis not present

## 2014-05-23 LAB — TSH: TSH: 2.623 u[IU]/mL (ref 0.350–4.500)

## 2014-05-23 MED ORDER — SODIUM CHLORIDE 0.9 % IV SOLN: INTRAVENOUS | Status: DC

## 2014-05-23 NOTE — Progress Notes (Signed)
Patient did not receive ivf today, d/t difficult stick. Dr Tammi Klippel notified. He will evaluate patient on 05/26/14 at MD visit.

## 2014-05-23 NOTE — Telephone Encounter (Signed)
THIS INFORMATION WAS GIVEN TO DIXIE SMITH,RN.

## 2014-05-26 ENCOUNTER — Encounter: Payer: Self-pay | Admitting: Radiation Oncology

## 2014-05-26 ENCOUNTER — Ambulatory Visit
Admission: RE | Admit: 2014-05-26 | Discharge: 2014-05-26 | Disposition: A | Discharge status: Home / Self Care | Payer: Medicare Other | Source: Ambulatory Visit | Attending: Radiation Oncology | Admitting: Radiation Oncology

## 2014-05-26 ENCOUNTER — Other Ambulatory Visit: Payer: Self-pay | Admitting: Adult Health

## 2014-05-26 ENCOUNTER — Encounter: Payer: Self-pay | Admitting: *Deleted

## 2014-05-26 VITALS — BP 121/54 | HR 85 | Temp 99.0°F | Resp 20

## 2014-05-26 DIAGNOSIS — C44300 Unspecified malignant neoplasm of skin of unspecified part of face: Secondary | ICD-10-CM

## 2014-05-26 LAB — BASIC METABOLIC PANEL (CC13)
ANION GAP: 10 meq/L (ref 3–11)
BUN: 21.8 mg/dL (ref 7.0–26.0)
CHLORIDE: 108 meq/L (ref 98–109)
CO2: 24 mEq/L (ref 22–29)
Calcium: 9.5 mg/dL (ref 8.4–10.4)
Creatinine: 1.3 mg/dL — ABNORMAL HIGH (ref 0.6–1.1)
EGFR: 40 mL/min/{1.73_m2} — ABNORMAL LOW (ref >90)
Glucose: 144 mg/dl — ABNORMAL HIGH (ref 70–140)
Potassium: 4.5 mEq/L (ref 3.5–5.1)
SODIUM: 142 meq/L (ref 136–145)

## 2014-05-26 LAB — BASIC METABOLIC PANEL
BUN: 22 mg/dL (ref 6–23)
CO2: 21 mEq/L (ref 19–32)
Calcium: 8.9 mg/dL (ref 8.4–10.5)
Chloride: 107 mEq/L (ref 96–112)
Creat: 1.13 mg/dL — ABNORMAL HIGH (ref 0.50–1.10)
Glucose, Bld: 107 mg/dL — ABNORMAL HIGH (ref 70–99)
Potassium: 4.3 mEq/L (ref 3.5–5.3)
SODIUM: 142 meq/L (ref 135–145)

## 2014-05-26 NOTE — Progress Notes (Addendum)
Patient denies pain, specifically mouth/throat pain, difficulty swallowing. She continues to reside at [facility]. Per note from rehab she is refusing all medications and foods. Patient's tongue with white/yellow coating, unsure if thrush or from milk products she drinks. Per [name], patient's friend she "isn't eating or drinking anything". [name] gives pt chocolate protein milk drink once a day that pt does like. Patient's skin in neck treatment area is red, dry desquamation. Friends with her today state they will get lotion for patient. BMP drawn this morning, results pending.

## 2014-05-26 NOTE — Addendum Note (Signed)
Encounter addended by: Andria Rhein, RN on: 05/26/2014  1:26 PM<BR>     Documentation filed: Medications

## 2014-05-26 NOTE — Progress Notes (Signed)
CC: Dr. Eppie Gibson  Follow-up note: [name] visits today approximately one week following completion of palliative hypofractionated radiation therapy to her face and neck.  She received IV fluids last week.  She resides at [facility]  She has been increasing her by mouth fluid intake at the nursing home.  She has not been taking in as much fluid as requested.  She is not weighed today.  She uses Biafine cream along her face and neck.  She is scheduled see Dr. Lucia Gaskins tomorrow for her left ear drainage.  Her basic metabolic profile this morning was satisfactory.  Her BUN/creatinine ratio was 16.8.  Potassium 4.5.  Physical examination: She is hard of hearing.  Wt Readings from Last 3 Encounters:  05/20/14 193 lb 12.8 oz (87.907 kg)  05/18/14 152 lb 5.4 oz (69.1 kg)  04/28/14 163 lb (73.936 kg)   Temp Readings from Last 3 Encounters:  05/21/14 97.6 F (36.4 C) Oral  05/20/14 97.2 F (36.2 C)   05/19/14 97.8 F (36.6 C) Oral   BP Readings from Last 3 Encounters:  05/23/14 124/59  05/21/14 127/50  05/20/14 134/66   Pulse Readings from Last 3 Encounters:  05/23/14 72  05/21/14 66  05/20/14 78    She is in no acute distress and drinks 7 ounces of Coca-Cola during my visit with her.  On examination, there is residual erythema along the left face and neck.  There is slight drainage from the left ear canal.  Oral cavity and oropharynx are unremarkable to inspection.  There is no significant mucositis.  There is no obvious candidiasis although there is slight whitish discoloration of her tongue.  Laboratory data: BMET    Component Value Date/Time   NA 142 05/26/2014 1119   NA 143 05/18/2014 0436   K 4.5 05/26/2014 1119   K 4.5 05/18/2014 0436   CL 115* 05/18/2014 0436   CO2 24 05/26/2014 1119   CO2 23 05/18/2014 0436   GLUCOSE 144* 05/26/2014 1119   GLUCOSE 126* 05/18/2014 0436   BUN 21.8 05/26/2014 1119   BUN 37* 05/18/2014 0436   CREATININE 1.3* 05/26/2014 1119   CREATININE 1.42* 05/18/2014 0436   CALCIUM 9.5 05/26/2014 1119   CALCIUM 8.6 05/18/2014 0436   GFRNONAA 33* 05/18/2014 0436   GFRAA 38* 05/18/2014 0436   Impression: Based on her labs, I do not feel that she has significant dehydration.  She is able to drink fluids.  She needs to be encouraged to drink and eat.  I do not feel that she needs IV fluids today.  Plan: Follow-up visit with Dr. Isidore Moos in 2 weeks as scheduled.

## 2014-05-27 NOTE — Progress Notes (Addendum)
To provide support and encouragement, care continuity and to assess for needs, met with patient during f/u appt with Dr. Valere Dross.  She was accompanied by friends [name] and [place]. 1. She continues to be cared for at [facility]. 2. Patient indicated she is drinking water, coke.  She was encouraged to drink additional fluids, nutritional supplements. 3. IVF not indicated at this time. 4. No needs were identified.  Gayleen Orem, RN, BSN, [place] at [place] [phone]

## 2014-05-29 ENCOUNTER — Encounter: Payer: Self-pay | Admitting: *Deleted

## 2014-06-02 ENCOUNTER — Ambulatory Visit (INDEPENDENT_AMBULATORY_CARE_PROVIDER_SITE_OTHER): Payer: Medicare Other | Admitting: Cardiovascular Disease

## 2014-06-02 ENCOUNTER — Encounter: Payer: Self-pay | Admitting: Cardiovascular Disease

## 2014-06-02 VITALS — BP 102/54 | Resp 16 | Wt 145.0 lb

## 2014-06-02 DIAGNOSIS — I5032 Chronic diastolic (congestive) heart failure: Secondary | ICD-10-CM

## 2014-06-02 DIAGNOSIS — R001 Bradycardia, unspecified: Secondary | ICD-10-CM

## 2014-06-02 DIAGNOSIS — I441 Atrioventricular block, second degree: Secondary | ICD-10-CM

## 2014-06-02 DIAGNOSIS — I2510 Atherosclerotic heart disease of native coronary artery without angina pectoris: Secondary | ICD-10-CM

## 2014-06-02 DIAGNOSIS — Z950 Presence of cardiac pacemaker: Secondary | ICD-10-CM

## 2014-06-02 HISTORY — DX: Atrioventricular block, second degree: I44.1

## 2014-06-02 LAB — MDC_IDC_ENUM_SESS_TYPE_INCLINIC
Battery Remaining Longevity: 12
Brady Statistic AP VS Percent: 28.1 %
Brady Statistic AS VP Percent: 0.4 %
Lead Channel Impedance Value: 530 Ohm
Lead Channel Impedance Value: 532 Ohm
Lead Channel Pacing Threshold Amplitude: 0.375 V
Lead Channel Pacing Threshold Amplitude: 0.875 V
Lead Channel Pacing Threshold Pulse Width: 0.4 ms
Lead Channel Sensing Intrinsic Amplitude: 16 mV
Lead Channel Sensing Intrinsic Amplitude: 2.8 mV
Lead Channel Setting Pacing Amplitude: 2 V
Lead Channel Setting Pacing Amplitude: 2 V
Lead Channel Setting Pacing Pulse Width: 0.4 ms
Lead Channel Setting Sensing Sensitivity: 5.6 mV
MDC IDC MSMT BATTERY VOLTAGE: 2.79 V
MDC IDC MSMT LEADCHNL RA PACING THRESHOLD PULSEWIDTH: 0.4 ms
MDC IDC STAT BRADY AP VP PERCENT: 0.2 %
MDC IDC STAT BRADY AS VS PERCENT: 71.3 %

## 2014-06-02 NOTE — Patient Instructions (Signed)
Remote monitoring is used to monitor your Pacemaker from home. This monitoring reduces the number of office visits required to check your device to one time per year. It allows [place] to monitor the functioning of your device to ensure it is working properly. You are scheduled for a device check from home on September 02, 2104. You may send your transmission at any time that day. If you have a wireless device, the transmission will be sent automatically. After your physician reviews your transmission, you will receive a postcard with your next transmission date.  Dr. Sallyanne Kuster recommends that you schedule a follow-up appointment in: ONE YEAR.

## 2014-06-02 NOTE — Progress Notes (Signed)
Patient ID: [name], female   DOB: [birth year], 81 y.o.   MRN: [id number]     Reason for office visit 2nd degree AV block, pacemaker check  [name] is here for a pacemaker check. She sees Dr. Gwenlyn Found for coronary artery disease. Recently she has had surgery and radiation therapy for a left parotid gland tumor.  Her dual-chamber Medtronic pacemaker was implanted in 2012 for both sinus node dysfunction and transient second-degree AV block. She has had a lot of problems with loss of taste, poor appetite and weight loss. She resides at [facility]. She had to receive intravenous fluids recently. There is no evidence that the radiation therapy caused any interference with normal pacemaker function.  Interrogation of her pacemaker shows normal device function. Estimated longevity of the generator is 12 years. There is roughly 28% atrial pacing and only 0.6% ventricular pacing. 7 very brief episodes of mode switch have been recorded all of them under 30 seconds in duration. The overall burden of atrial arrhythmia is substantially lower than it was 5 or 6 months ago. The presenting rhythm today was normal sinus with one-to-one AV conduction. The heart rate histogram distribution is normal. No pacemaker adjustments were necessary. No Known Allergies  Current Outpatient Prescriptions  Medication Sig Dispense Refill  . ciprofloxacin-dexamethasone (CIPRODEX) otic suspension Place 4 drops into the left ear 2 (two) times daily. 7.5 mL 0  . divalproex (DEPAKOTE SPRINKLE) 125 MG capsule Take 125 mg by mouth 2 (two) times daily.    Marland Kitchen erythromycin ophthalmic ointment Place 1 application into the left eye every 4 (four) hours while awake.     . feeding supplement, ENSURE COMPLETE, (ENSURE COMPLETE) LIQD Take 237 mLs by mouth 3 (three) times daily between meals. 90 Bottle 1  . gabapentin (NEURONTIN) 300 MG capsule Take 300 mg by mouth 2 (two) times daily.     Marland Kitchen HYDROcodone-acetaminophen (NORCO/VICODIN)  5-325 MG per tablet Take 1 tablet by mouth every 6 (six) hours as needed for moderate pain.    Marland Kitchen insulin glargine (LANTUS) 100 UNIT/ML injection Inject 0.08 mLs (8 Units total) into the skin at bedtime. 10 mL 11  . insulin lispro (HUMALOG) 100 UNIT/ML KiwkPen Inject 0-5 Units into the skin 3 (three) times daily between meals. On sliding scale.  0-149 = 0 units 150 and above = 5 units    . isosorbide mononitrate (IMDUR) 30 MG 24 hr tablet Take 1 tablet (30 mg total) by mouth daily. 30 tablet 5  . levothyroxine (SYNTHROID, LEVOTHROID) 25 MCG tablet Take 25 mcg by mouth daily.      Marland Kitchen lidocaine (XYLOCAINE) 2 % solution Caregiver: Mix 1part 2% viscous lidocaine, 1part H20. Swish and/or swallow 55mL of this mixture, 53min before meals and at bedtime, up to QID. Only swish in mouth, if mouth is sore but throat is not sore. 100 mL 2  . lisinopril (PRINIVIL,ZESTRIL) 5 MG tablet Take 5 mg by mouth daily.    Marland Kitchen LORazepam (ATIVAN) 0.5 MG tablet Take 0.25 mg by mouth 2 (two) times daily as needed for anxiety.    . metoprolol tartrate (LOPRESSOR) 25 MG tablet Take 25 mg by mouth 2 (two) times daily.      . nitroGLYCERIN (NITROSTAT) 0.4 MG SL tablet Place 1 tablet (0.4 mg total) under the tongue every 5 (five) minutes as needed. Chest pain (Patient taking differently: Place 0.4 mg under the tongue every 5 (five) minutes as needed for chest pain. ) 25 tablet 3  . oxybutynin (  DITROPAN-XL) 10 MG 24 hr tablet Take 10 mg by mouth every other day.    . polyvinyl alcohol (LIQUIFILM TEARS) 1.4 % ophthalmic solution Place 2 drops into the left eye 3 (three) times daily. 15 mL 0  . triamcinolone (KENALOG) 0.025 % cream Apply 1 application topically 2 (two) times daily. Apply to legs twice a day for 10 days     No current facility-administered medications for this visit.   Facility-Administered Medications Ordered in Other Visits  Medication Dose Route Frequency Provider Last Rate Last Dose  . 0.9 %  sodium chloride  infusion  1,000 mL Intravenous Continuous Eppie Gibson, MD 250 mL/hr at 05/21/14 1020      Past Medical History  Diagnosis Date  . Coronary artery disease     multiple PCI; echo 09/15/10-EF45-50%, aortic valve sclerosis without stenosis; myoview 03/23/11-no reversible defects, EF66%  . Hypertension   . Pacemaker 09/2010    sinus node dysfunction, sinus bradycardia  . Hyperlipidemia   . Hypothyroidism   . Carotid bruit 10/25/2002    carotid doppler- normal  . Myocardial infarction   . Chronic kidney disease     stage IV renal disease  . 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 Osteopenia   . COPD (chronic obstructive pulmonary disease)   . Stroke about 20 years ago     1998  . Skin cancer     ermoved from left side of face  . Syncope and collapse   . Localized osteoarthrosis, lower leg   . Generalized anxiety disorder   . Mild mood disorder   . Other specified malignant neoplasm of skin of other parts of face   . Other specified malignant neoplasm of skin of other parts of face   . Hypothyroidism, adult   . CHF (congestive heart failure)   . Insulin dependent diabetes mellitus     fasting cbg 160s  . Diabetes mellitus type II, controlled, with no complications   . History of radiation therapy 04/30/14- 05/19/14    skin of face  . Second degree AV block 06/02/2014    Past Surgical History  Procedure Laterality Date  . Insert / replace / remove pacemaker  09/21/10    medtronic Adapta   . Coronary angioplasty with stent placement  09/13/10    preserved LV function, PTCA/stenting of the RCA with insertion of two stents, 2.75x77mm stent proximal RCA and 2.25x37mm at the distal RCA, post stent dilatation up to 2.38mm  . Cardiac catheterization  05/28/10    EF 65%, medical therapy  . Coronary angioplasty with stent placement  06/04/09    PCI, stenting of the mid and prox RCA with Promus DES  . Cardiac catheterization  06/03/09    PCI, stenting of the major oblique marginal arteries off the  circ  . Coronary angioplasty with stent placement  10/16/2002    stenting of mid RCA with 2.75x40mm Cypher stent deployed at 16 atmospheres; EF 60  . Coronary angioplasty with stent placement  11/29/2001    2.75x75mm cutting balloon angioplasty of the in stent restenosis of RCA, bracytherapy of the in stend restenotic segment of mid RCA  . Cardiac catheterization  11/28/2001    high grade isten restenosis of RCA stent placed 03/02/01, proceed with cutting balloon 8/28  . Coronary angioplasty with stent placement  03/02/2001    ptca and stenting of RCA (2.75x81mm Ceta) after predilatation with a 2.5x86mm CrossSail and OpenSail balloon; EF 60%  . Tumor removal  on side of neck  . Parotidectomy Left 03/14/2014    Procedure: LEFT PAROTIDECTOMY WITH FACIAL NERVE DISSECTION ;  Surgeon: Rozetta Nunnery, MD;  Location: [facility];  Service: ENT;  Laterality: Left;  Total parotidectomy with facial nerve dissection, limited LEFT nerve dissection, LEFT tarsorrhaphy  . Radical neck dissection Left 03/14/2014    Procedure: LEFT NECK DISSECTION;  Surgeon: Rozetta Nunnery, MD;  Location: [facility];  Service: ENT;  Laterality: Left;    Family History  Problem Relation Age of Onset  . Asthma Mother   . Stroke Mother   . Diabetes Father   . Heart failure Father     History   Social History  . Marital Status: Single    Spouse Name: N/A  . Number of Children: N/A  . Years of Education: N/A   Occupational History  . Not on file.   Social History Main Topics  . Smoking status: Former Smoker -- 0.50 packs/day    Quit date: 02/15/2012  . Smokeless tobacco: Not on file  . Alcohol Use: No  . Drug Use: No  . Sexual Activity: No   Other Topics Concern  . Not on file   Social History Narrative   ** Merged History Encounter **       Patient lives alone at home.   Next of kin and have her power of attorney is Arliss Journey at phone number listed    Review of systems: The patient specifically  denies any chest pain at rest exertion, dyspnea at rest or with exertion, orthopnea, paroxysmal nocturnal dyspnea, syncope, palpitations, focal neurological deficits, intermittent claudication, lower extremity edema, unexplained weight gain, cough, hemoptysis or wheezing.   PHYSICAL EXAM BP 102/54 mmHg  Resp 16  Wt 145 lb (65.772 kg) General: Alert, oriented x3, no distress  Head: no evidence of trauma, PERRL, EOMI, no exophtalmos or lid lag, no myxedema, no xanthelasma; normal ears, nose and oropharynx ; soft tissue loss and hyperpigmented skin over the left lateral retromandibular area Neck: normal jugular venous pulsations and no hepatojugular reflux; brisk carotid pulses without delay and no carotid bruits  Chest: clear to auscultation, no signs of consolidation by percussion or palpation, normal fremitus, symmetrical and full respiratory excursions , healthy subclavian pacemaker site Cardiovascular: normal position and quality of the apical impulse, regular rhythm, normal first and second heart sounds, no murmurs, rubs or gallops  Abdomen: no tenderness or distention, no masses by palpation, no abnormal pulsatility or arterial bruits, normal bowel sounds, no hepatosplenomegaly  Extremities: no clubbing, cyanosis or edema; 2+ radial, ulnar and brachial pulses bilaterally; 2+ right femoral, posterior tibial and dorsalis pedis pulses; 2+ left femoral, posterior tibial and dorsalis pedis pulses; no subclavian or femoral bruits  Neurological: grossly nonfocal, quite hard of hearing    Lipid Panel     Component Value Date/Time   CHOL 128 03/23/2011 0618   TRIG 126 03/23/2011 0618   HDL 59 03/23/2011 0618   CHOLHDL 2.2 03/23/2011 0618   VLDL 25 03/23/2011 0618   LDLCALC 44 03/23/2011 0618    BMET    Component Value Date/Time   NA 142 05/26/2014 1119   NA 143 05/18/2014 0436   K 4.5 05/26/2014 1119   K 4.5 05/18/2014 0436   CL 115* 05/18/2014 0436   CO2 24 05/26/2014 1119    CO2 23 05/18/2014 0436   GLUCOSE 144* 05/26/2014 1119   GLUCOSE 126* 05/18/2014 0436   BUN 21.8 05/26/2014 1119   BUN 37*  05/18/2014 0436   CREATININE 1.3* 05/26/2014 1119   CREATININE 1.42* 05/18/2014 0436   CALCIUM 9.5 05/26/2014 1119   CALCIUM 8.6 05/18/2014 0436   GFRNONAA 33* 05/18/2014 0436   GFRAA 38* 05/18/2014 0436     ASSESSMENT AND PLAN  Normal dual chamber permanent pacemaker function. Will continue with remote downloads every 3 months via [employer] and a yearly office visit.  Patient Instructions  Remote monitoring is used to monitor your Pacemaker from home. This monitoring reduces the number of office visits required to check your device to one time per year. It allows [place] to monitor the functioning of your device to ensure it is working properly. You are scheduled for a device check from home on September 02, 2104. You may send your transmission at any time that day. If you have a wireless device, the transmission will be sent automatically. After your physician reviews your transmission, you will receive a postcard with your next transmission date.  Dr. Sallyanne Kuster recommends that you schedule a follow-up appointment in: ONE YEAR.        Orders Placed This Encounter  Procedures  . Implantable device check   No orders of the defined types were placed in this encounter.    Holli Humbles, MD, [place] [phone] office [phone] pager

## 2014-06-06 ENCOUNTER — Telehealth: Payer: Self-pay | Admitting: *Deleted

## 2014-06-06 ENCOUNTER — Ambulatory Visit: Payer: Medicare Other | Admitting: Radiation Oncology

## 2014-06-06 ENCOUNTER — Non-Acute Institutional Stay (SKILLED_NURSING_FACILITY): Payer: Medicare Other | Admitting: Adult Health

## 2014-06-06 DIAGNOSIS — C44300 Unspecified malignant neoplasm of skin of unspecified part of face: Secondary | ICD-10-CM

## 2014-06-06 DIAGNOSIS — C44310 Basal cell carcinoma of skin of unspecified parts of face: Secondary | ICD-10-CM | POA: Diagnosis not present

## 2014-06-06 DIAGNOSIS — R630 Anorexia: Secondary | ICD-10-CM | POA: Diagnosis not present

## 2014-06-06 DIAGNOSIS — N179 Acute kidney failure, unspecified: Secondary | ICD-10-CM

## 2014-06-06 NOTE — Telephone Encounter (Signed)
Received vm earlier today from Bard Herbert, NP, [employer]. She requested call back [id number] on [facility]. Called Ms Nyoka Cowden who states she is currently at [facility] rehab where pt is residing. She states she was called re: Ms Durden not eating or drinking. She states CBC, CMP labs have been drawn. She states she has been unable to get IV access and will be giving patient IVF subcutaneously, 60 cc/hour, will give 1 liter, allow patient to rest and possibly give more IVF. She states she will have lab results faxed to this office. Reference pt's FU this afternoon, advised Ms Nyoka Cowden that appointment needs to be rescheduled so patient can safely receive her IVF today. She agreed, stated she would have scheduler call to reschedule for next week. Ms Nyoka Cowden stated she may be reached at any time by [phone].

## 2014-06-10 ENCOUNTER — Telehealth: Payer: Self-pay | Admitting: *Deleted

## 2014-06-10 DIAGNOSIS — R630 Anorexia: Secondary | ICD-10-CM | POA: Insufficient documentation

## 2014-06-10 NOTE — Progress Notes (Signed)
Patient ID: [name], female   DOB: [birth year], 81 y.o.   MRN: [id number]  [name] living [place]     No Known Allergies     Chief Complaint  Patient presents with  . Acute Visit    poor appetite and fluid intake     HPI:  He appetite is very poor. Staff reports that she is not eating or drinking. She tells me that she has not appetite. She is not having any nausea. She says that she just does not want to eat.    Past Medical History  Diagnosis Date  . Coronary artery disease     multiple PCI; echo 09/15/10-EF45-50%, aortic valve sclerosis without stenosis; myoview 03/23/11-no reversible defects, EF66%  . Hypertension   . Pacemaker 09/2010    sinus node dysfunction, sinus bradycardia  . Hyperlipidemia   . Hypothyroidism   . Carotid bruit 10/25/2002    carotid doppler- normal  . Myocardial infarction   . Chronic kidney disease     stage IV renal disease  . 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 Osteopenia   . COPD (chronic obstructive pulmonary disease)   . Stroke about 20 years ago     1998  . Skin cancer     ermoved from left side of face  . Syncope and collapse   . Localized osteoarthrosis, lower leg   . Generalized anxiety disorder   . Mild mood disorder   . Other specified malignant neoplasm of skin of other parts of face   . Other specified malignant neoplasm of skin of other parts of face   . Hypothyroidism, adult   . CHF (congestive heart failure)   . Insulin dependent diabetes mellitus     fasting cbg 160s  . Diabetes mellitus type II, controlled, with no complications   . History of radiation therapy 04/30/14- 05/19/14    skin of face  . Second degree AV block 06/02/2014    Past Surgical History  Procedure Laterality Date  . Insert / replace / remove pacemaker  09/21/10    medtronic Adapta   . Coronary angioplasty with stent placement  09/13/10    preserved LV function, PTCA/stenting of the RCA with insertion of two stents, 2.75x35mm stent  proximal RCA and 2.25x41mm at the distal RCA, post stent dilatation up to 2.60mm  . Cardiac catheterization  05/28/10    EF 65%, medical therapy  . Coronary angioplasty with stent placement  06/04/09    PCI, stenting of the mid and prox RCA with Promus DES  . Cardiac catheterization  06/03/09    PCI, stenting of the major oblique marginal arteries off the circ  . Coronary angioplasty with stent placement  10/16/2002    stenting of mid RCA with 2.75x20mm Cypher stent deployed at 16 atmospheres; EF 60  . Coronary angioplasty with stent placement  11/29/2001    2.75x56mm cutting balloon angioplasty of the in stent restenosis of RCA, bracytherapy of the in stend restenotic segment of mid RCA  . Cardiac catheterization  11/28/2001    high grade isten restenosis of RCA stent placed 03/02/01, proceed with cutting balloon 8/28  . Coronary angioplasty with stent placement  03/02/2001    ptca and stenting of RCA (2.75x26mm Ceta) after predilatation with a 2.5x75mm CrossSail and OpenSail balloon; EF 60%  . Tumor removal      on side of neck  . Parotidectomy Left 03/14/2014    Procedure: LEFT PAROTIDECTOMY WITH FACIAL NERVE DISSECTION ;  Surgeon: Rozetta Nunnery, MD;  Location: [place];  Service: ENT;  Laterality: Left;  Total parotidectomy with facial nerve dissection, limited LEFT nerve dissection, LEFT tarsorrhaphy  . Radical neck dissection Left 03/14/2014    Procedure: LEFT NECK DISSECTION;  Surgeon: Rozetta Nunnery, MD;  Location: [place];  Service: ENT;  Laterality: Left;    VITAL SIGNS BP 113/69 mmHg  Pulse 63  Ht 5\' 2"  (1.575 m)  Wt 155 lb (70.308 kg)  BMI 28.34 kg/m2   Outpatient Encounter Prescriptions as of 05/23/2014  Medication Sig  . ciprofloxacin-dexamethasone (CIPRODEX) otic suspension Place 4 drops into the left ear 2 (two) times daily.  . divalproex (DEPAKOTE SPRINKLE) 125 MG capsule Take 125 mg by mouth 2 (two) times daily.  Marland Kitchen erythromycin ophthalmic ointment Place 1  application into the left eye every 4 (four) hours while awake.   . feeding supplement, ENSURE COMPLETE, (ENSURE COMPLETE) LIQD Take 237 mLs by mouth 3 (three) times daily between meals.  . gabapentin (NEURONTIN) 300 MG capsule Take 300 mg by mouth 2 (two) times daily.   Marland Kitchen HYDROcodone-acetaminophen (NORCO/VICODIN) 5-325 MG per tablet Take 1 tablet by mouth every 6 (six) hours as needed for moderate pain.  Marland Kitchen insulin glargine (LANTUS) 100 UNIT/ML injection Inject 0.08 mLs (8 Units total) into the skin at bedtime.  . insulin lispro (HUMALOG) 100 UNIT/ML KiwkPen Inject 0-5 Units into the skin 3 (three) times daily between meals. On sliding scale.  0-149 = 0 units 150 and above = 5 units  . isosorbide mononitrate (IMDUR) 30 MG 24 hr tablet Take 1 tablet (30 mg total) by mouth daily.  Marland Kitchen levothyroxine (SYNTHROID, LEVOTHROID) 25 MCG tablet Take 25 mcg by mouth daily.    Marland Kitchen lidocaine (XYLOCAINE) 2 % solution Caregiver: Mix 1part 2% viscous lidocaine, 1part H20. Swish and/or swallow 31mL of this mixture, 25min before meals and at bedtime, up to QID. Only swish in mouth, if mouth is sore but throat is not sore.  Marland Kitchen lisinopril (PRINIVIL,ZESTRIL) 5 MG tablet Take 5 mg by mouth daily.  Marland Kitchen LORazepam (ATIVAN) 0.5 MG tablet Take 0.25 mg by mouth 2 (two) times daily as needed for anxiety.  . metoprolol tartrate (LOPRESSOR) 25 MG tablet Take 25 mg by mouth 2 (two) times daily.    . nitroGLYCERIN (NITROSTAT) 0.4 MG SL tablet Place 1 tablet (0.4 mg total) under the tongue every 5 (five) minutes as needed. Chest pain (Patient taking differently: Place 0.4 mg under the tongue every 5 (five) minutes as needed for chest pain. )  . oxybutynin (DITROPAN-XL) 10 MG 24 hr tablet Take 10 mg by mouth every other day.  . polyvinyl alcohol (LIQUIFILM TEARS) 1.4 % ophthalmic solution Place 2 drops into the left eye 3 (three) times daily.  Marland Kitchen triamcinolone (KENALOG) 0.025 % cream Apply 1 application topically 2 (two) times daily. Apply to  legs twice a day for 10 days     SIGNIFICANT DIAGNOSTIC EXAMS  04-21-14: chest x-ray: No acute cardiopulmonary process.  04-21-14: ct of head: Remote infarcts and small vessel disease type changes without CT evidence of large acute infarct.No intracranial hemorrhage.  04-22-14: bilateral lower extremity doppler: No evidence of deep vein thrombosis involving the right lower   extremity. - No evidence of deep vein thrombosis involving the left lower extremity. - No evidence of Baker&'s cyst on the right or left  04-22-14:2-d echo: Left ventricle: The cavity size was normal. Wall thickness was increased in a pattern of mild LVH. Systolic  function was normal. The estimated ejection fraction was in the range of 55% to 60%. Wall motion was normal; there were no regional wall motion abnormalities. Features are consistent with a pseudonormal left ventricular filling pattern, with concomitant abnormal relaxation and increased filling pressure (grade 2 diastolic dysfunction) Doppler parameters are consistent with high ventricular filling pressure. - Mitral valve: Calcified annulus. Mildly thickened leaflets .There was moderate regurgitation. - Left atrium: The atrium was moderately dilated.  04-30-14: left knee x-ray: mild osteoarthritis of left knee    LABS REVIEWED:   04-21-14: wbc 10.7; hgb 11.2; hct 34.8; mcv 93.5; plt 236; glucose 143; bun 26; creat 1.7; k+5.6; na++141; tsh 11.498; free t4: 1.07 04-22-14: wbc 9.5; hgb 10.0; hct 31.0; mcv 93.1; plt 223; glucose 97; bun 12; creat 1.3; k+3.7; na++145; liver normal albumin 2.9; hgb a1c 7.0 04-23-14: wbc 10.4; hgb 9.8; hct 30.1; mcv 92.3 ;plt 226; gluocse 123; bun 19; creat 1.25; k+4.0; na++144;     Review of Systems  Constitutional: Negative for malaise/fatigue.  HENT:       Has left facial numbness and is unable to hear out of left ear   Respiratory: Negative for cough and shortness of breath.   Cardiovascular: Negative for chest pain,  palpitations and leg swelling.  Gastrointestinal: Negative for heartburn, abdominal pain and constipation.  Musculoskeletal: Positive for myalgias, back pain and joint pain.       Is having back pain and knee pain.   Skin: Negative.   Psychiatric/Behavioral: Negative for depression. The patient is not nervous/anxious and does not have insomnia.           ROS Constitutional: Negative for malaise/fatigue. no appetite  HENT:       Has left facial numbness and is unable to hear out of left ear   Respiratory: Negative for cough and shortness of breath.   Cardiovascular: Negative for chest pain, palpitations and leg swelling.  Gastrointestinal: Negative for heartburn, abdominal pain and constipation.  Musculoskeletal: negative for pain    Skin: Negative.   Psychiatric/Behavioral: Negative for depression. The patient is not nervous/anxious and does not have insomnia.      Physical Exam Constitutional: No distress.   Neck: No JVD present.  Has some swelling present left side of neck; unable to hear in left ear  Cardiovascular: Normal rate, regular rhythm and intact distal pulses.   Respiratory: Effort normal and breath sounds normal. No respiratory distress.  GI: Soft. Bowel sounds are normal. She exhibits no distension. There is no tenderness.  Musculoskeletal: She exhibits no edema.  Is able to move all extremities.   Neurological: She is alert.  Skin: Skin is warm and dry. She is not diaphoretic.    ASSESSMENT/ PLAN:  1. Anorexia: will being megace 400 mg before lunch and supper; will stop her zocor and will continue to monitor her status.     Ok Edwards NP [facility]  Contact [phone] Monday through Friday 8am- 5pm  After hours call [phone]

## 2014-06-10 NOTE — Telephone Encounter (Signed)
Called Bard Herbert, NP, [place] re: update on patient. She states Ms Homan's labs have improved. She has started Ms Fritsch on Marinol. Ms Johnstone is drinking much better at this time. Informed Debbie this RN did not get lab results on patient. She stated she would refax. Also informed her that pt is not scheduled for FU with Dr Isidore Moos. She stated she would have someone call to reschedule that appointment.

## 2014-06-16 ENCOUNTER — Encounter: Payer: Self-pay | Admitting: Internal Medicine

## 2014-06-23 ENCOUNTER — Encounter: Payer: Self-pay | Admitting: Cardiovascular Disease

## 2014-06-23 NOTE — Progress Notes (Signed)
Patient ID: [name], female   DOB: 20-Feb-1929, 81 y.o.   MRN: [id number]  [name] living [place]     No Known Allergies     Chief Complaint  Patient presents with  . Acute Visit    patient status     HPI:  She is not eating or drinking at this time. Her renal failure is worse. She has been declining the megace. She states that she does not want to eat. She is slightly disoriented today. She does not want to go to the hospital.   Past Medical History  Diagnosis Date  . Coronary artery disease     multiple PCI; echo 09/15/10-EF45-50%, aortic valve sclerosis without stenosis; myoview 03/23/11-no reversible defects, EF66%  . Hypertension   . Pacemaker 09/2010    sinus node dysfunction, sinus bradycardia  . Hyperlipidemia   . Hypothyroidism   . Carotid bruit 10/25/2002    carotid doppler- normal  . Myocardial infarction   . Chronic kidney disease     stage IV renal disease  . 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 Osteopenia   . COPD (chronic obstructive pulmonary disease)   . Stroke about 20 years ago     1998  . Skin cancer     ermoved from left side of face  . Syncope and collapse   . Localized osteoarthrosis, lower leg   . Generalized anxiety disorder   . Mild mood disorder   . Other specified malignant neoplasm of skin of other parts of face   . Other specified malignant neoplasm of skin of other parts of face   . Hypothyroidism, adult   . CHF (congestive heart failure)   . Insulin dependent diabetes mellitus     fasting cbg 160s  . Diabetes mellitus type II, controlled, with no complications   . History of radiation therapy 04/30/14- 05/19/14    skin of face  . Second degree AV block 06/02/2014    Past Surgical History  Procedure Laterality Date  . Insert / replace / remove pacemaker  09/21/10    medtronic Adapta   . Coronary angioplasty with stent placement  09/13/10    preserved LV function, PTCA/stenting of the RCA with insertion of two stents,  2.75x29mm stent proximal RCA and 2.25x43mm at the distal RCA, post stent dilatation up to 2.43mm  . Cardiac catheterization  05/28/10    EF 65%, medical therapy  . Coronary angioplasty with stent placement  06/04/09    PCI, stenting of the mid and prox RCA with Promus DES  . Cardiac catheterization  06/03/09    PCI, stenting of the major oblique marginal arteries off the circ  . Coronary angioplasty with stent placement  10/16/2002    stenting of mid RCA with 2.75x78mm Cypher stent deployed at 16 atmospheres; EF 60  . Coronary angioplasty with stent placement  11/29/2001    2.75x39mm cutting balloon angioplasty of the in stent restenosis of RCA, bracytherapy of the in stend restenotic segment of mid RCA  . Cardiac catheterization  11/28/2001    high grade isten restenosis of RCA stent placed 03/02/01, proceed with cutting balloon 8/28  . Coronary angioplasty with stent placement  03/02/2001    ptca and stenting of RCA (2.75x38mm Ceta) after predilatation with a 2.5x59mm CrossSail and OpenSail balloon; EF 60%  . Tumor removal      on side of neck  . Parotidectomy Left 03/14/2014    Procedure: LEFT PAROTIDECTOMY WITH FACIAL NERVE DISSECTION ;  Surgeon: Rozetta Nunnery, MD;  Location: [place];  Service: ENT;  Laterality: Left;  Total parotidectomy with facial nerve dissection, limited LEFT nerve dissection, LEFT tarsorrhaphy  . Radical neck dissection Left 03/14/2014    Procedure: LEFT NECK DISSECTION;  Surgeon: Rozetta Nunnery, MD;  Location: [place];  Service: ENT;  Laterality: Left;    VITAL SIGNS BP 122/64 mmHg  Pulse 87  Ht 5\' 2"  (1.575 m)  Wt 146 lb (66.225 kg)  BMI 26.70 kg/m2   Outpatient Encounter Prescriptions as of 06/06/2014  Medication Sig  . ciprofloxacin-dexamethasone (CIPRODEX) otic suspension Place 4 drops into the left ear 2 (two) times daily.  . divalproex (DEPAKOTE SPRINKLE) 125 MG capsule Take 125 mg by mouth 2 (two) times daily.  Marland Kitchen erythromycin ophthalmic ointment  Place 1 application into the left eye every 4 (four) hours while awake.   . feeding supplement, ENSURE COMPLETE, (ENSURE COMPLETE) LIQD Take 237 mLs by mouth 3 (three) times daily between meals.  . gabapentin (NEURONTIN) 300 MG capsule Take 300 mg by mouth 2 (two) times daily.   Marland Kitchen HYDROcodone-acetaminophen (NORCO/VICODIN) 5-325 MG per tablet Take 1 tablet by mouth every 6 (six) hours as needed for moderate pain.  Marland Kitchen insulin glargine (LANTUS) 100 UNIT/ML injection Inject 0.08 mLs (8 Units total) into the skin at bedtime.  . insulin lispro (HUMALOG) 100 UNIT/ML KiwkPen Inject 0-5 Units into the skin 3 (three) times daily between meals. On sliding scale.  0-149 = 0 units 150 and above = 5 units  . isosorbide mononitrate (IMDUR) 30 MG 24 hr tablet Take 1 tablet (30 mg total) by mouth daily.  Marland Kitchen levothyroxine (SYNTHROID, LEVOTHROID) 25 MCG tablet Take 25 mcg by mouth daily.    Marland Kitchen lidocaine (XYLOCAINE) 2 % solution Caregiver: Mix 1part 2% viscous lidocaine, 1part H20. Swish and/or swallow 44mL of this mixture, 90min before meals and at bedtime, up to QID. Only swish in mouth, if mouth is sore but throat is not sore.  Marland Kitchen lisinopril (PRINIVIL,ZESTRIL) 5 MG tablet Take 5 mg by mouth daily.  Marland Kitchen LORazepam (ATIVAN) 0.5 MG tablet Take 0.25 mg by mouth 2 (two) times daily as needed for anxiety.  . metoprolol tartrate (LOPRESSOR) 25 MG tablet Take 25 mg by mouth 2 (two) times daily.    . nitroGLYCERIN (NITROSTAT) 0.4 MG SL tablet Place 1 tablet (0.4 mg total) under the tongue every 5 (five) minutes as needed. Chest pain (Patient taking differently: Place 0.4 mg under the tongue every 5 (five) minutes as needed for chest pain. )  . oxybutynin (DITROPAN-XL) 10 MG 24 hr tablet Take 10 mg by mouth every other day.  . polyvinyl alcohol (LIQUIFILM TEARS) 1.4 % ophthalmic solution Place 2 drops into the left eye 3 (three) times daily.  Marland Kitchen triamcinolone (KENALOG) 0.025 % cream Apply 1 application topically 2 (two) times daily.  Apply to legs twice a day for 10 days     SIGNIFICANT DIAGNOSTIC EXAMS   04-21-14: chest x-ray: No acute cardiopulmonary process.  04-21-14: ct of head: Remote infarcts and small vessel disease type changes without CT evidence of large acute infarct.No intracranial hemorrhage.  04-22-14: bilateral lower extremity doppler: No evidence of deep vein thrombosis involving the right lower   extremity. - No evidence of deep vein thrombosis involving the left lower extremity. - No evidence of Baker&'s cyst on the right or left  04-22-14:2-d echo: Left ventricle: The cavity size was normal. Wall thickness was increased in a pattern of mild LVH.  Systolic function was normal. The estimated ejection fraction was in the range of 55% to 60%. Wall motion was normal; there were no regional wall motion abnormalities. Features are consistent with a pseudonormal left ventricular filling pattern, with concomitant abnormal relaxation and increased filling pressure (grade 2 diastolic dysfunction) Doppler parameters are consistent with high ventricular filling pressure. - Mitral valve: Calcified annulus. Mildly thickened leaflets .There was moderate regurgitation. - Left atrium: The atrium was moderately dilated.  04-30-14: left knee x-ray: mild osteoarthritis of left knee    LABS REVIEWED:   04-21-14: wbc 10.7; hgb 11.2; hct 34.8; mcv 93.5; plt 236; glucose 143; bun 26; creat 1.7; k+5.6; na++141; tsh 11.498; free t4: 1.07 04-22-14: wbc 9.5; hgb 10.0; hct 31.0; mcv 93.1; plt 223; glucose 97; bun 12; creat 1.3; k+3.7; na++145; liver normal albumin 2.9; hgb a1c 7.0 04-23-14: wbc 10.4; hgb 9.8; hct 30.1; mcv 92.3 ;plt 226; glucose 123; bun 19; creat 1.25; k+4.0; na++144;  05-23-14: tsh 2.623 06-01-14; glucose 107; bun 22; creat 1.13; k+4.3; na++142 06-06-14: wbc 9.7; hgb 9.4; hct 28.8; mcv 87.3; plt 280; glucose 109; bun 61; creat 3.45; k+4.0; na++137      ROS Constitutional: Negative for malaise/fatigue. no appetite    HENT:       Has left facial numbness and is unable to hear out of left ear   Respiratory: Negative for cough and shortness of breath.   Cardiovascular: Negative for chest pain, palpitations and leg swelling.  Gastrointestinal: Negative for heartburn, abdominal pain and constipation.  Musculoskeletal: negative for pain    Skin: Negative.   Psychiatric/Behavioral: Negative for depression. The patient is not nervous/anxious and does not have insomnia.     Physical Exam Constitutional: No distress.   Neck: No JVD present.  Has some swelling present left side of neck; unable to hear in left ear  Cardiovascular: Normal rate, regular rhythm and intact distal pulses.   Respiratory: Effort normal and breath sounds normal. No respiratory distress.  GI: Soft. Bowel sounds are normal. She exhibits no distension. There is no tenderness.  Musculoskeletal: She exhibits no edema.  Is able to move all extremities.   Neurological: She is alert.  Skin: Skin is warm and dry. She is not diaphoretic.    ASSESSMENT/ PLAN:  1. Anorexia  2. Acute renal failure 3. Skin cancer to face   Will stop depakote Will change ativan to every 8 hours as needed Will stop megace Will begin marinol 2.5 mg bid ac lunch and supper Will stop ditropan Will being D5 1/2 ns at 100 cc per hour for 2 liters  Will repeat bmp   Time spent with patient 40 minutes.   Ok Edwards NP [facility]  Contact [phone] Monday through Friday 8am- 5pm  After hours call [phone]

## 2014-06-25 ENCOUNTER — Telehealth: Payer: Self-pay | Admitting: *Deleted

## 2014-06-25 ENCOUNTER — Encounter: Payer: Self-pay | Admitting: *Deleted

## 2014-06-25 NOTE — Telephone Encounter (Signed)
Spoke with patient's friend, [name], to see how Loria is doing.    [name] reported Kally remains at [employer], has been bedfast, not eating or drinking.    She is alert and responsive, interactive, oriented to person.  [name] stated that a guardian has been assigned; he has been communicative with her and Colletta [place].  There are no plans at this time for Katoya to leave [facility].   Gayleen Orem, RN, BSN, [place] at [place] [phone]

## 2014-06-27 ENCOUNTER — Encounter: Payer: Self-pay | Admitting: *Deleted

## 2014-06-28 NOTE — Progress Notes (Signed)
To provide support and comfort, visited A[place] at [facility].  She seemed to remember me, was able to communicate though speech was difficult to understand.  I facilitated a call to her friend [name].  Spoke with [name], she questioned the importance of keeping next Tuesday's follow-up appt with Dr. Isidore Moos considering Johnella's current state of decline -  She asked that I cancel appt. -  I noted that I would share her concern with Dr. Isidore Moos, that either I or Stanton Kidney, RN, would get back with her and [facility].  Gayleen Orem, RN, BSN, [place] at [place] [phone]

## 2014-06-30 NOTE — Progress Notes (Signed)
I spoke with Ms Natasha Hailey, LPN responsible for [name] at [facility], [phone]. She states Ms [name] is barely eating or drinking; she may take a sip of water or Coke or protein drink occasionally. She refuses to get out of bed, wants her blinds closed most of the time. Informed Ms [name] that this office will cancel Ms [name]'s FU here on 07/01/14 due to her declining status and probable fragility. Ms [name] stated she would "talk to the proper person" to cancel Ms [name]'s transportation.

## 2014-07-01 ENCOUNTER — Ambulatory Visit: Payer: Medicare Other | Admitting: Radiation Oncology

## 2014-07-04 ENCOUNTER — Ambulatory Visit: Payer: Self-pay | Admitting: Radiation Oncology

## 2014-07-07 ENCOUNTER — Other Ambulatory Visit: Payer: Self-pay | Admitting: *Deleted

## 2014-07-07 MED ORDER — AMBULATORY NON FORMULARY MEDICATION: Status: AC

## 2014-07-07 NOTE — Telephone Encounter (Signed)
[facility] 

## 2014-07-10 ENCOUNTER — Telehealth: Payer: Self-pay | Admitting: *Deleted

## 2014-07-10 NOTE — Telephone Encounter (Signed)
Received VM from Kaleb's friend [name] indicating Madalynn died yesterday.  She was still residing at [facility] at the time.  I will follow-up with [name] this afternoon.  Gayleen Orem, RN, BSN, [place] at [place] [phone]

## 2014-08-03 DEATH — deceased

## 2016-04-20 IMAGING — CT CT NECK W/O CM
4 of 5 series · 15 of 33 positions shown, 18 images · non-contrast
Comparison: Neck CT without contrast 01/09/2008.

CLINICAL DATA: [age] year female with left parotid mass
discovered 1 month ago. Current history of benign left parotid mass
currently on antibiotics. Initial encounter.

EXAM:
CT NECK WITHOUT CONTRAST
TECHNIQUE: Multidetector CT imaging of the neck was performed following the
standard protocol without intravenous contrast.

[Series 2: axial neck · axial · 0.46mm/px · z∈[-21,+24]mm · 2 of 92 slices shown]
[im 19/92  bone]
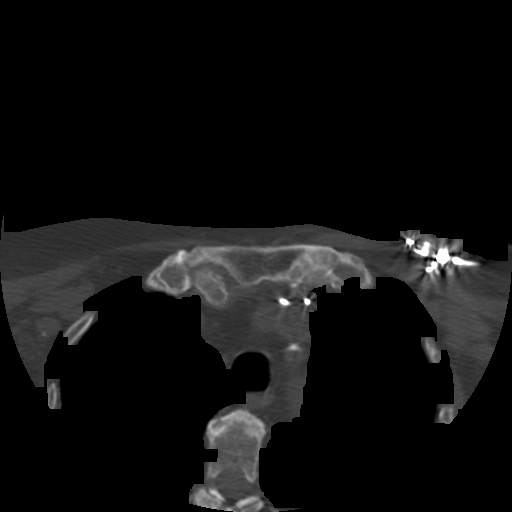
[im 37/92  bone]
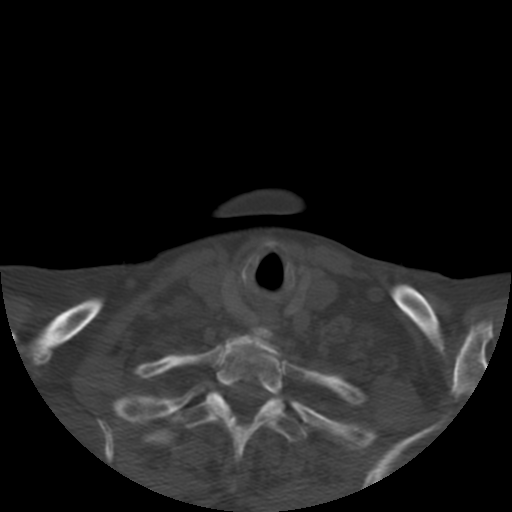

[Series 400: sag · sagittal · 0.46mm/px · 5 of 75 slices shown, 6 images]
[im 25/75  bone]
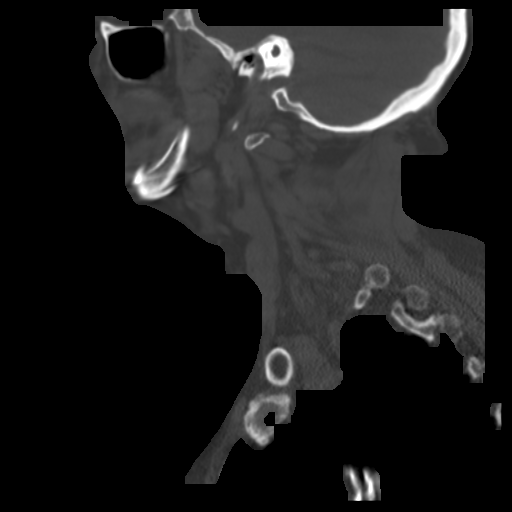
[im 31/75  bone]
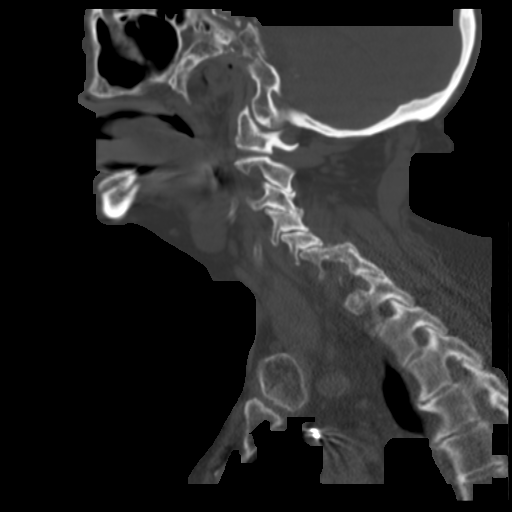
[im 38/75  soft-tissue]
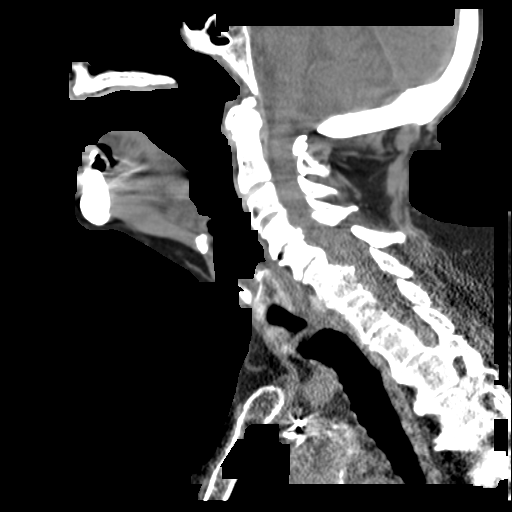
[im 38/75  bone]
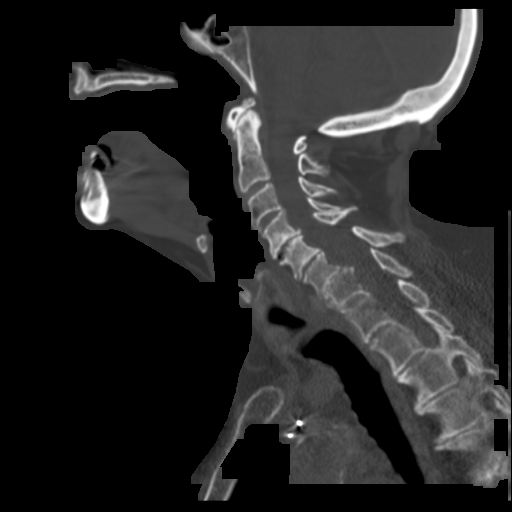
[im 44/75  bone]
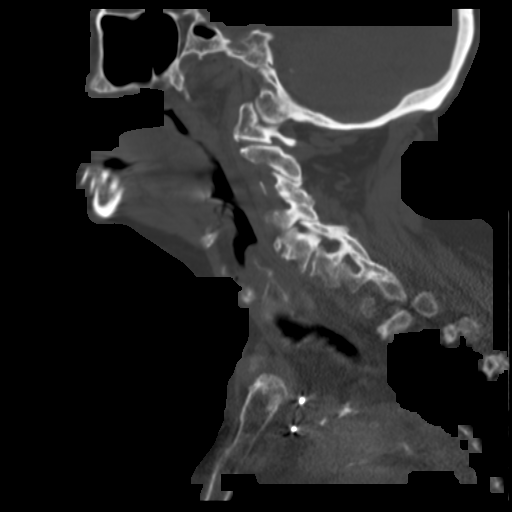
[im 50/75  bone]
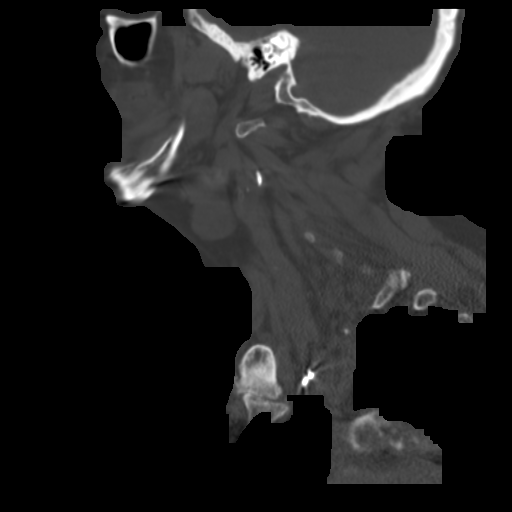

[Series 401: cor · coronal · 0.46mm/px · 3 of 88 slices shown]
[im 28/88  bone]
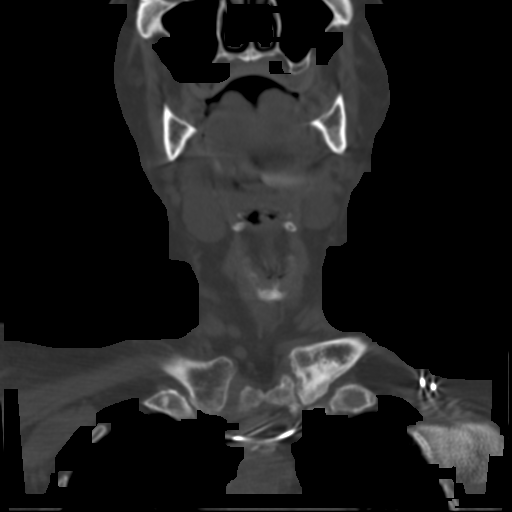
[im 39/88  bone]
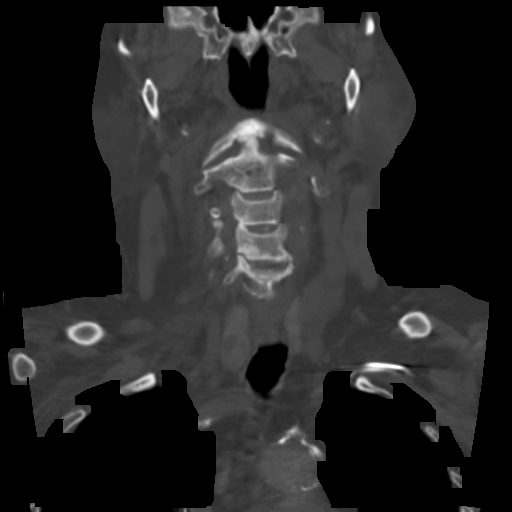
[im 49/88  bone]
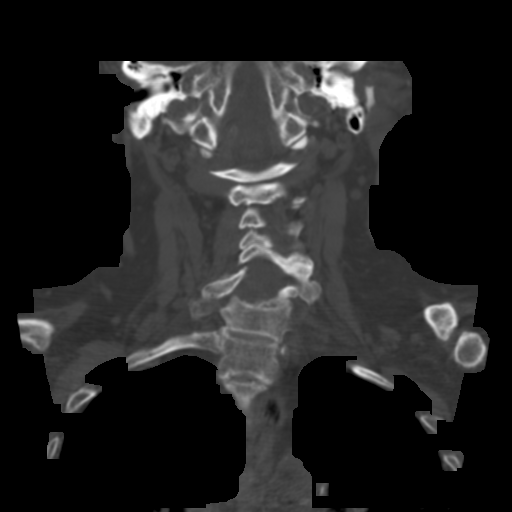

[Series 402: angled axial · axial · 0.46mm/px · z∈[-81,+62]mm · 5 of 105 slices shown, 7 images]
[im 18/105  soft-tissue]
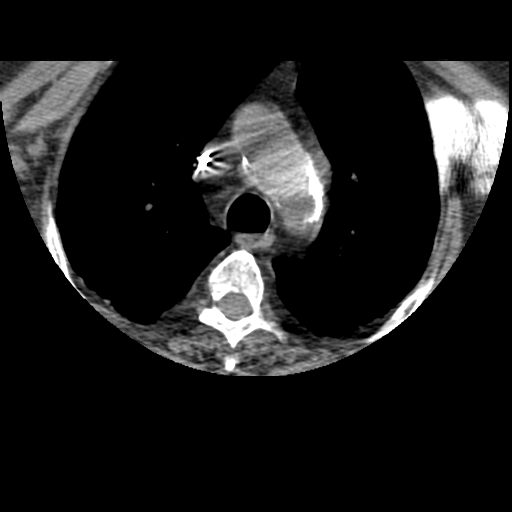
[im 18/105  bone]
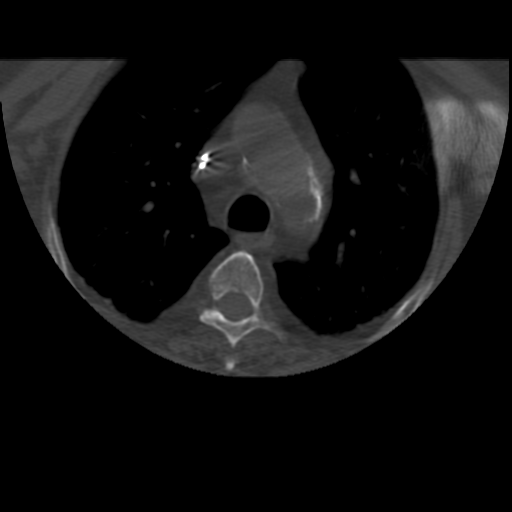
[im 35/105  bone]
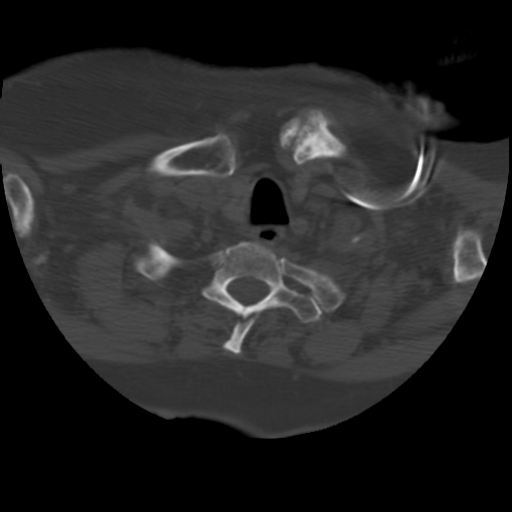
[im 53/105  bone]
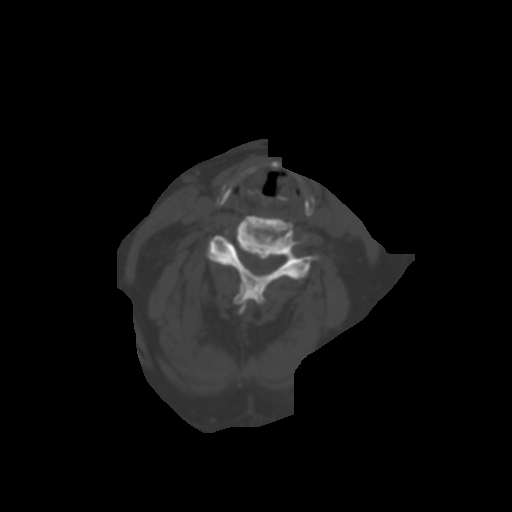
[im 70/105  bone]
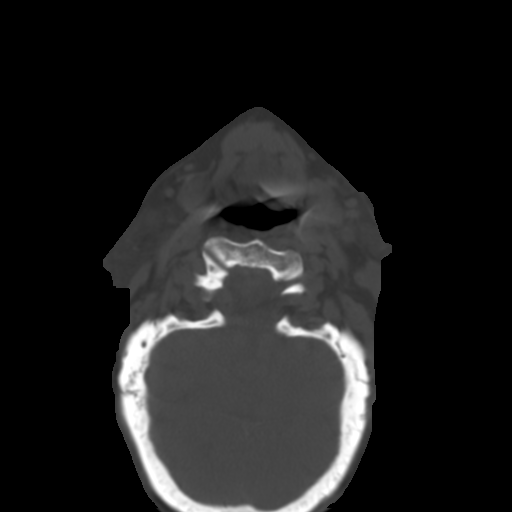
[im 87/105  soft-tissue]
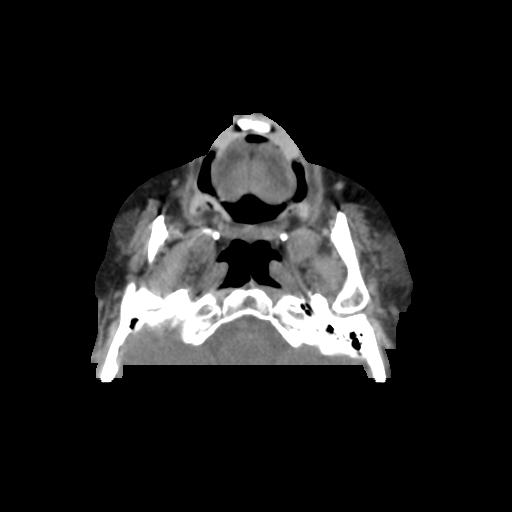
[im 87/105  bone]
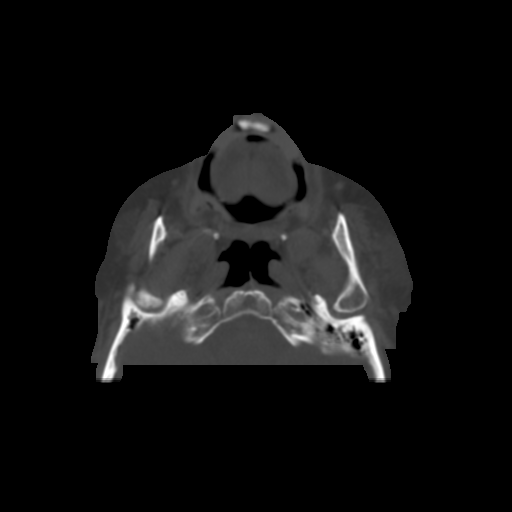

[15 of 33 positions shown; findings below may reference images not displayed]

FINDINGS: A left chest cardiac pacemaker or AICD is now in place. Upper
thoracic streak artifact is associated. Stable, negative visualized
upper lungs. Chronic calcified great vessel atherosclerosis. No
superior mediastinal lymphadenopathy identified. No acute upper
thorax osseous abnormality identified. Chronic degenerative changes
at the left sternoclavicular joint.

A 13 mm hypodense nodule in the right thyroid lobe has mildly
increased since 9886, but this is likely benign and no followup
indicated for this lesion size in this patient age. Larynx, pharynx
(motion artifact at the oropharynx), retropharyngeal and sublingual
spaces are within normal limits. Negative non contrast submandibular
and right parotid glands. Stable visible non contrast brain
parenchyma. Calcified atherosclerosis at the skull base. Motion
artifact of the mandible. Degenerative osseous changes in the
cervical spine. No acute osseous abnormality identified. Visualized
paranasal sinuses and mastoids are clear.

In 9886 there was an oval circumscribed soft tissue lesion in the
inferior aspect of the left parotid space. '

Now there is a rounded but more indistinct marginated intermediate
density soft tissue mass involving both the superficial and some of
the deep lobe of the left parotid gland and encompassing 32 x 25 x
41 mm (AP by transverse by CC). See series 2, image 21. Just
superficial to this lesion in the superior superficial lobe of the
left parotid there is a round hyperdense nodule located subjacent to
the skin marker an measuring 8-9 mm diameter (coronal image 43). The
remaining left parotid parenchyma appears within normal limits. No
surrounding subcutaneous inflammation. Postoperative changes to the
overlying dermis is noted just posterior and inferior to the
dominant lesion today. Normal left stylomastoid foramina.

Some small level 2 B lymph nodes are mildly increased in size
compared to 9886 (series 2, image 30), but overall no
lymphadenopathy.
IMPRESSION: 1. Intermediate density left parotid lesion is less
well-circumscribed and larger than that resected in 9886, measuring
32 x 25 x 41 mm. With a more superficially located 8-9 mm
intermediate density round nodule (subjacent to the skin marker),
which could be a satellite lesion or a reactive intra-parotid node.
2. Recurrence of Warthin tumor following resection is unusual, but
Warthins can be multiple and bilateral. The differential
consideration includes other primary parotid tumors.
3. The right parotid gland remains normal. No other acute findings
identified in the neck.
